# Patient Record
Sex: Female | Born: 1935 | Race: White | Hispanic: No | Smoking: Never smoker
Health system: Southern US, Community
[De-identification: ages and names within clinical notes are randomized; demographics above are authoritative.]

## PROBLEM LIST (undated history)

## (undated) DIAGNOSIS — Z9114 Patient's other noncompliance with medication regimen: Secondary | ICD-10-CM

## (undated) DIAGNOSIS — M1990 Unspecified osteoarthritis, unspecified site: Secondary | ICD-10-CM

## (undated) DIAGNOSIS — G8929 Other chronic pain: Secondary | ICD-10-CM

## (undated) DIAGNOSIS — K449 Diaphragmatic hernia without obstruction or gangrene: Secondary | ICD-10-CM

## (undated) DIAGNOSIS — F32A Depression, unspecified: Secondary | ICD-10-CM

## (undated) DIAGNOSIS — K5289 Other specified noninfective gastroenteritis and colitis: Secondary | ICD-10-CM

## (undated) DIAGNOSIS — Z9289 Personal history of other medical treatment: Secondary | ICD-10-CM

## (undated) DIAGNOSIS — F329 Major depressive disorder, single episode, unspecified: Secondary | ICD-10-CM

## (undated) DIAGNOSIS — E785 Hyperlipidemia, unspecified: Secondary | ICD-10-CM

## (undated) DIAGNOSIS — M549 Dorsalgia, unspecified: Secondary | ICD-10-CM

## (undated) DIAGNOSIS — F419 Anxiety disorder, unspecified: Secondary | ICD-10-CM

## (undated) DIAGNOSIS — Z91148 Patient's other noncompliance with medication regimen for other reason: Secondary | ICD-10-CM

## (undated) DIAGNOSIS — K529 Noninfective gastroenteritis and colitis, unspecified: Secondary | ICD-10-CM

## (undated) DIAGNOSIS — K5730 Diverticulosis of large intestine without perforation or abscess without bleeding: Secondary | ICD-10-CM

## (undated) DIAGNOSIS — I10 Essential (primary) hypertension: Secondary | ICD-10-CM

## (undated) DIAGNOSIS — J189 Pneumonia, unspecified organism: Secondary | ICD-10-CM

## (undated) HISTORY — DX: Patient's other noncompliance with medication regimen for other reason: Z91.148

## (undated) HISTORY — DX: Diverticulosis of large intestine without perforation or abscess without bleeding: K57.30

## (undated) HISTORY — DX: Diaphragmatic hernia without obstruction or gangrene: K44.9

## (undated) HISTORY — DX: Depression, unspecified: F32.A

## (undated) HISTORY — DX: Depression, unspecified: F41.9

## (undated) HISTORY — PX: SPINAL FIXATION SURGERY: SHX1055

## (undated) HISTORY — PX: VAGINAL HYSTERECTOMY: SUR661

## (undated) HISTORY — PX: HERNIA REPAIR: SHX51

## (undated) HISTORY — DX: Major depressive disorder, single episode, unspecified: F32.9

## (undated) HISTORY — DX: Hyperlipidemia, unspecified: E78.5

## (undated) HISTORY — PX: INCONTINENCE SURGERY: SHX676

## (undated) HISTORY — PX: JOINT REPLACEMENT: SHX530

## (undated) HISTORY — PX: BACK SURGERY: SHX140

## (undated) HISTORY — PX: EXCISIONAL HEMORRHOIDECTOMY: SHX1541

## (undated) HISTORY — DX: Essential (primary) hypertension: I10

## (undated) HISTORY — DX: Anxiety disorder, unspecified: F41.9

## (undated) HISTORY — DX: Patient's other noncompliance with medication regimen: Z91.14

## (undated) HISTORY — DX: Other specified noninfective gastroenteritis and colitis: K52.89

---

## 1974-07-25 DIAGNOSIS — Z9289 Personal history of other medical treatment: Secondary | ICD-10-CM

## 1974-07-25 HISTORY — DX: Personal history of other medical treatment: Z92.89

## 1997-04-23 ENCOUNTER — Other Ambulatory Visit: Admission: RE | Admit: 1997-04-23 | Discharge: 1997-04-23 | Payer: Self-pay | Admitting: Nephrology

## 1997-07-08 ENCOUNTER — Inpatient Hospital Stay (HOSPITAL_COMMUNITY): Admission: RE | Admit: 1997-07-08 | Discharge: 1997-07-10 | Payer: Self-pay | Admitting: *Deleted

## 1998-06-23 ENCOUNTER — Other Ambulatory Visit: Admission: RE | Admit: 1998-06-23 | Discharge: 1998-06-23 | Payer: Self-pay | Admitting: *Deleted

## 1998-11-04 ENCOUNTER — Ambulatory Visit (HOSPITAL_BASED_OUTPATIENT_CLINIC_OR_DEPARTMENT_OTHER): Admission: RE | Admit: 1998-11-04 | Discharge: 1998-11-04 | Payer: Self-pay | Admitting: Orthopedic Surgery

## 1999-01-30 ENCOUNTER — Inpatient Hospital Stay (HOSPITAL_COMMUNITY): Admission: EM | Admit: 1999-01-30 | Discharge: 1999-02-02 | Payer: Self-pay | Admitting: *Deleted

## 1999-02-01 ENCOUNTER — Encounter: Payer: Self-pay | Admitting: Internal Medicine

## 1999-06-09 ENCOUNTER — Other Ambulatory Visit: Admission: RE | Admit: 1999-06-09 | Discharge: 1999-06-09 | Payer: Self-pay | Admitting: *Deleted

## 1999-07-19 ENCOUNTER — Encounter: Admission: RE | Admit: 1999-07-19 | Discharge: 1999-07-19 | Payer: Self-pay | Admitting: General Surgery

## 1999-07-19 ENCOUNTER — Encounter: Payer: Self-pay | Admitting: General Surgery

## 1999-11-07 ENCOUNTER — Emergency Department (HOSPITAL_COMMUNITY): Admission: EM | Admit: 1999-11-07 | Discharge: 1999-11-08 | Payer: Self-pay | Admitting: Internal Medicine

## 1999-11-07 ENCOUNTER — Encounter: Payer: Self-pay | Admitting: Internal Medicine

## 1999-12-02 ENCOUNTER — Emergency Department (HOSPITAL_COMMUNITY): Admission: EM | Admit: 1999-12-02 | Discharge: 1999-12-02 | Payer: Self-pay

## 2000-01-03 ENCOUNTER — Encounter (INDEPENDENT_AMBULATORY_CARE_PROVIDER_SITE_OTHER): Payer: Self-pay

## 2000-01-03 ENCOUNTER — Other Ambulatory Visit: Admission: RE | Admit: 2000-01-03 | Discharge: 2000-01-03 | Payer: Self-pay | Admitting: Gastroenterology

## 2000-01-11 ENCOUNTER — Observation Stay (HOSPITAL_COMMUNITY): Admission: RE | Admit: 2000-01-11 | Discharge: 2000-01-12 | Payer: Self-pay | Admitting: Urology

## 2000-05-08 ENCOUNTER — Encounter: Admission: RE | Admit: 2000-05-08 | Discharge: 2000-05-08 | Payer: Self-pay | Admitting: Urology

## 2000-05-08 ENCOUNTER — Encounter: Payer: Self-pay | Admitting: Urology

## 2000-10-13 ENCOUNTER — Encounter (INDEPENDENT_AMBULATORY_CARE_PROVIDER_SITE_OTHER): Payer: Self-pay | Admitting: Specialist

## 2000-10-13 ENCOUNTER — Inpatient Hospital Stay (HOSPITAL_COMMUNITY): Admission: RE | Admit: 2000-10-13 | Discharge: 2000-10-17 | Payer: Self-pay | Admitting: Urology

## 2001-01-24 HISTORY — PX: VENTRAL HERNIA REPAIR: SHX424

## 2001-04-27 ENCOUNTER — Encounter: Payer: Self-pay | Admitting: General Surgery

## 2001-05-04 ENCOUNTER — Inpatient Hospital Stay (HOSPITAL_COMMUNITY): Admission: RE | Admit: 2001-05-04 | Discharge: 2001-05-08 | Payer: Self-pay | Admitting: General Surgery

## 2002-09-17 ENCOUNTER — Other Ambulatory Visit: Admission: RE | Admit: 2002-09-17 | Discharge: 2002-09-17 | Payer: Self-pay | Admitting: *Deleted

## 2003-11-24 ENCOUNTER — Other Ambulatory Visit: Admission: RE | Admit: 2003-11-24 | Discharge: 2003-11-24 | Payer: Self-pay | Admitting: *Deleted

## 2005-01-24 HISTORY — PX: SHOULDER ARTHROSCOPY: SHX128

## 2005-01-24 HISTORY — PX: TOTAL KNEE ARTHROPLASTY: SHX125

## 2005-03-22 ENCOUNTER — Ambulatory Visit (HOSPITAL_COMMUNITY): Admission: RE | Admit: 2005-03-22 | Discharge: 2005-03-23 | Payer: Self-pay | Admitting: Orthopedic Surgery

## 2005-07-12 ENCOUNTER — Inpatient Hospital Stay (HOSPITAL_COMMUNITY): Admission: RE | Admit: 2005-07-12 | Discharge: 2005-07-16 | Payer: Self-pay | Admitting: Orthopedic Surgery

## 2005-07-12 ENCOUNTER — Encounter (INDEPENDENT_AMBULATORY_CARE_PROVIDER_SITE_OTHER): Payer: Self-pay | Admitting: Specialist

## 2005-07-13 ENCOUNTER — Ambulatory Visit: Payer: Self-pay | Admitting: Physical Medicine & Rehabilitation

## 2005-08-11 ENCOUNTER — Observation Stay (HOSPITAL_COMMUNITY): Admission: EM | Admit: 2005-08-11 | Discharge: 2005-08-14 | Payer: Self-pay | Admitting: Emergency Medicine

## 2005-08-12 ENCOUNTER — Encounter: Payer: Self-pay | Admitting: Cardiology

## 2005-10-18 ENCOUNTER — Encounter: Admission: RE | Admit: 2005-10-18 | Discharge: 2005-10-18 | Payer: Self-pay | Admitting: Orthopedic Surgery

## 2006-04-17 ENCOUNTER — Emergency Department (HOSPITAL_COMMUNITY): Admission: EM | Admit: 2006-04-17 | Discharge: 2006-04-17 | Payer: Self-pay | Admitting: Emergency Medicine

## 2007-01-25 HISTORY — PX: LUMBAR SPINE SURGERY: SHX701

## 2007-07-30 ENCOUNTER — Ambulatory Visit (HOSPITAL_COMMUNITY): Admission: RE | Admit: 2007-07-30 | Discharge: 2007-07-31 | Payer: Self-pay | Admitting: Orthopaedic Surgery

## 2007-12-25 ENCOUNTER — Inpatient Hospital Stay (HOSPITAL_COMMUNITY): Admission: EM | Admit: 2007-12-25 | Discharge: 2008-01-01 | Payer: Self-pay | Admitting: Emergency Medicine

## 2008-03-29 IMAGING — US US EXTREM LOW VENOUS*L*
1 series · 14 of 24 positions shown · non-contrast
Comparison: none

CLINICAL DATA: Left leg pain and swelling

 Left  lower extremity venous Doppler ultrasound:
TECHNIQUE: Gray-scale sonography with compression as well as color and duplex
Doppler ultrasound were performed to evaluate the deep venous system from the
level of the common femoral vein through the popliteal and proximal calf veins.

[Series 1: unknown · 14 of 34 slices shown]
[im 1/34]
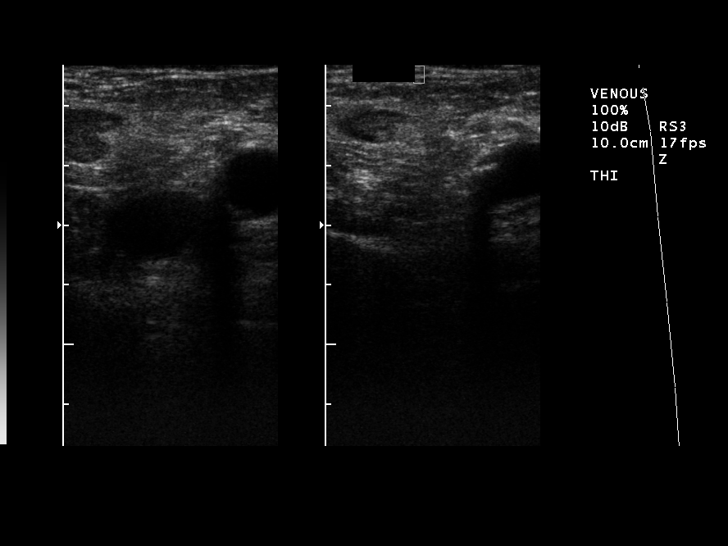
[im 3/34]
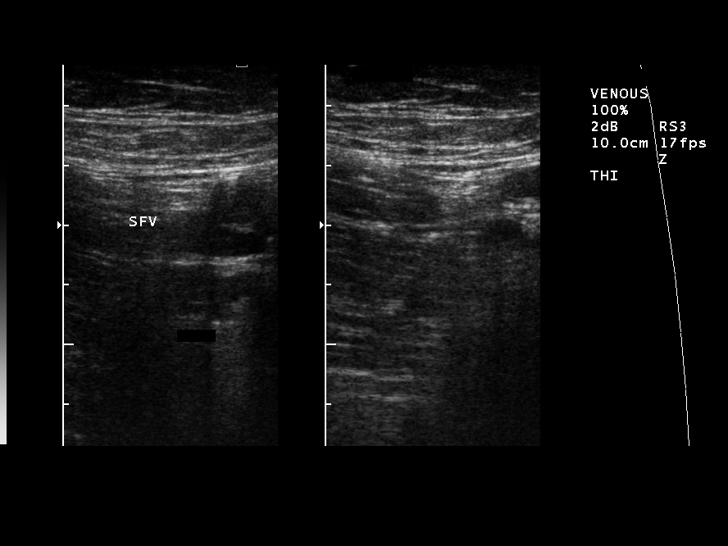
[im 6/34]
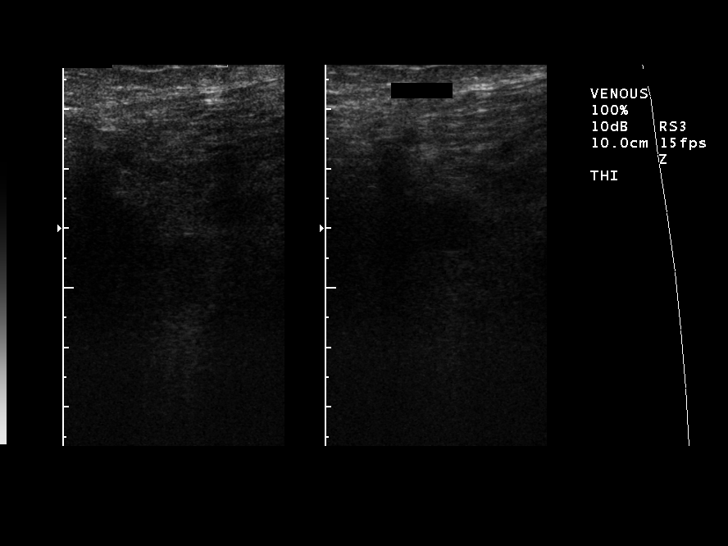
[im 9/34]
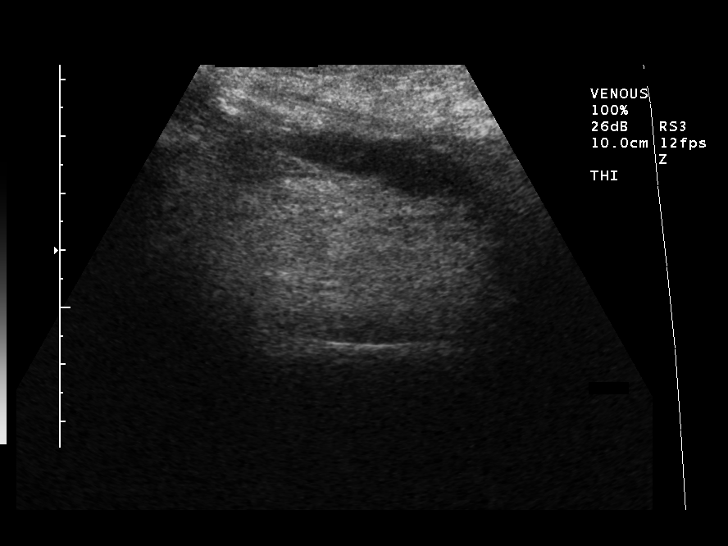
[im 11/34]
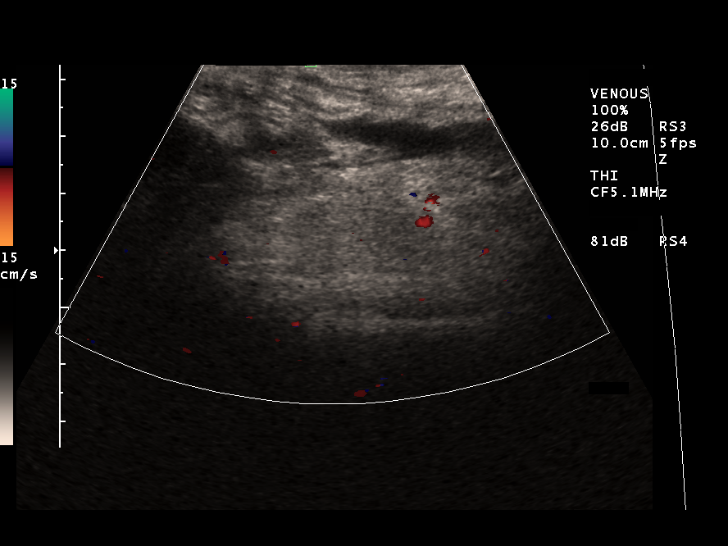
[im 13/34]
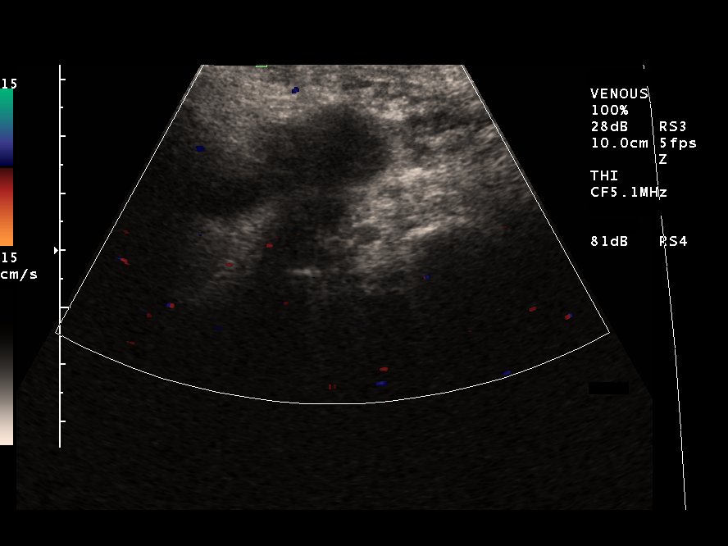
[im 16/34]
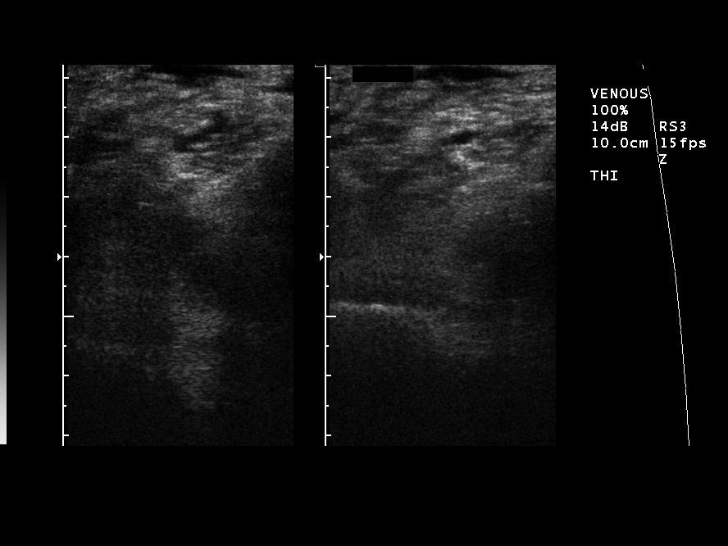
[im 18/34]
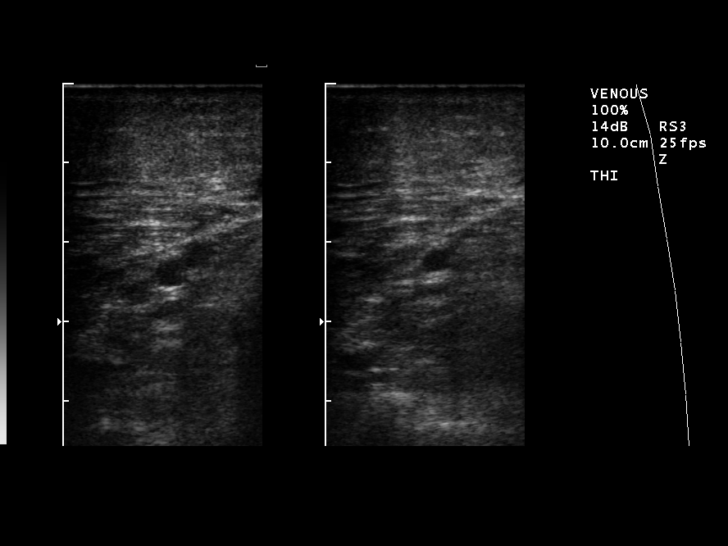
[im 21/34]
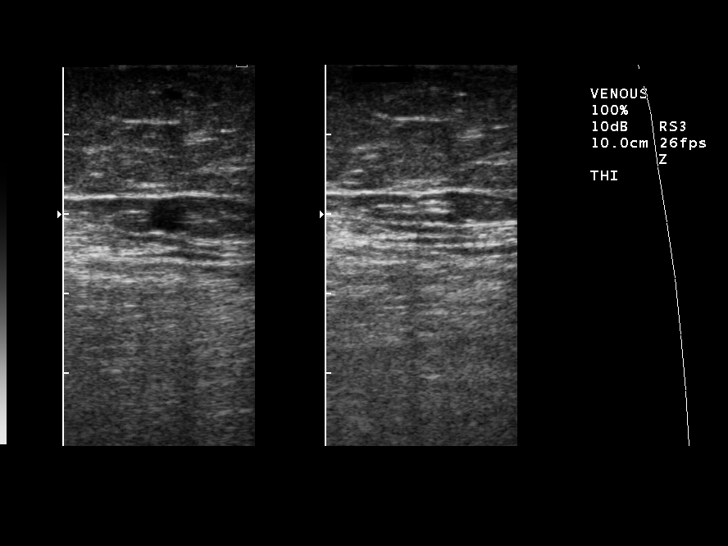
[im 23/34]
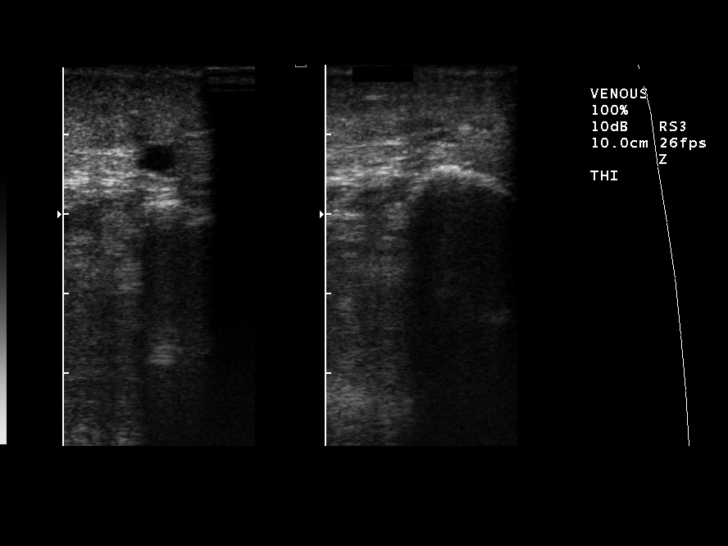
[im 26/34]
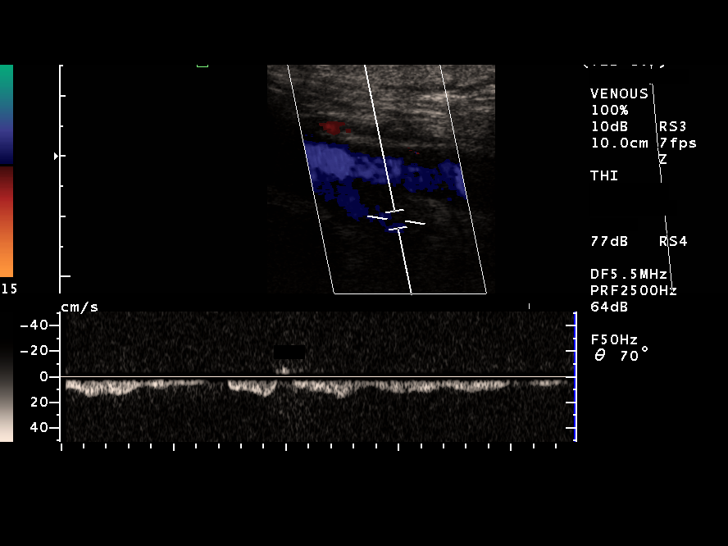
[im 28/34]
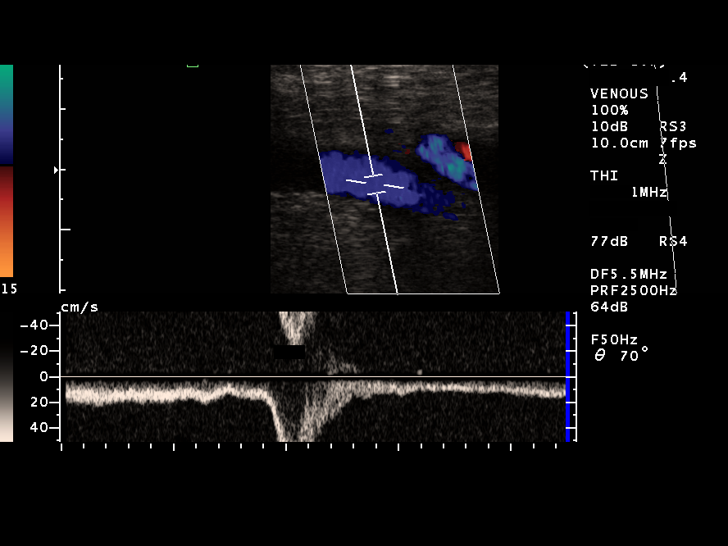
[im 31/34]
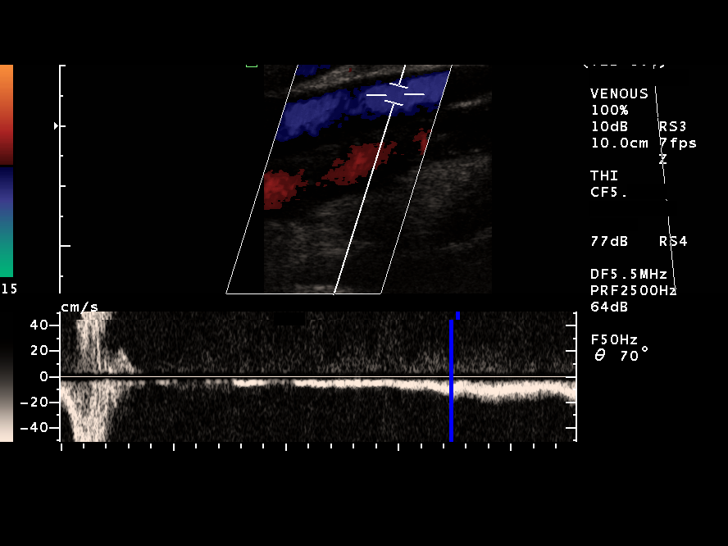
[im 34/34]
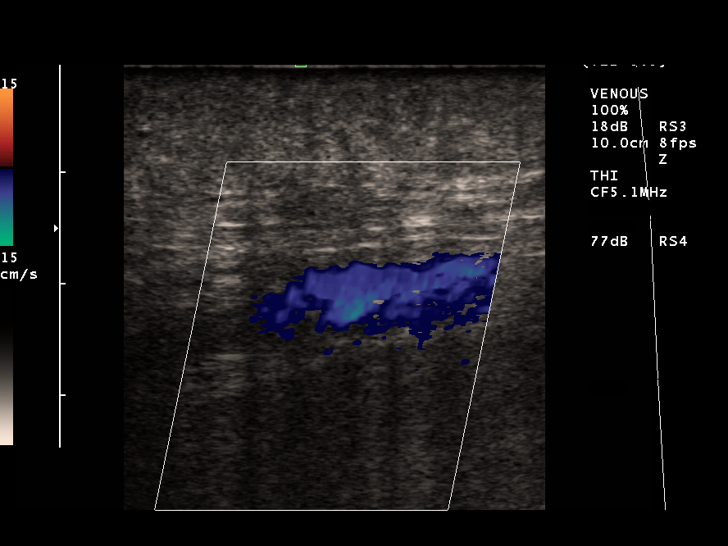

[14 of 24 positions shown; findings below may reference images not displayed]

FINDINGS: The lower extremity deep venous system demonstrates normal
compressibility, phasicity, and augmentation. Posterior tibial vein
unremarkable.  No evidence of DVT. There is a poorly marginated complex fluid
region seen   posteromedially at the left knee extending to the upper calf,
possibly ruptured Baker's cyst.
IMPRESSION: 1. Negative for left  lower extremity DVT.
2. Query ruptured Baker's cyst.

## 2009-03-20 ENCOUNTER — Encounter: Admission: RE | Admit: 2009-03-20 | Discharge: 2009-03-20 | Payer: Self-pay | Admitting: Specialist

## 2009-12-07 ENCOUNTER — Emergency Department (HOSPITAL_COMMUNITY): Admission: EM | Admit: 2009-12-07 | Discharge: 2009-12-07 | Payer: Self-pay | Admitting: Emergency Medicine

## 2009-12-10 ENCOUNTER — Inpatient Hospital Stay (HOSPITAL_COMMUNITY): Admission: RE | Admit: 2009-12-10 | Discharge: 2009-12-14 | Payer: Self-pay | Admitting: Orthopaedic Surgery

## 2009-12-11 ENCOUNTER — Ambulatory Visit: Payer: Self-pay | Admitting: Physical Medicine & Rehabilitation

## 2010-02-14 ENCOUNTER — Encounter: Payer: Self-pay | Admitting: Specialist

## 2010-04-06 LAB — CBC
HCT: 35.6 % — ABNORMAL LOW (ref 36.0–46.0)
MCH: 29.3 pg (ref 26.0–34.0)
MCHC: 32.3 g/dL (ref 30.0–36.0)
RDW: 12.7 % (ref 11.5–15.5)

## 2010-04-06 LAB — GLUCOSE, CAPILLARY
Glucose-Capillary: 111 mg/dL — ABNORMAL HIGH (ref 70–99)
Glucose-Capillary: 116 mg/dL — ABNORMAL HIGH (ref 70–99)
Glucose-Capillary: 141 mg/dL — ABNORMAL HIGH (ref 70–99)
Glucose-Capillary: 199 mg/dL — ABNORMAL HIGH (ref 70–99)
Glucose-Capillary: 68 mg/dL — ABNORMAL LOW (ref 70–99)
Glucose-Capillary: 82 mg/dL (ref 70–99)
Glucose-Capillary: 89 mg/dL (ref 70–99)
Glucose-Capillary: 89 mg/dL (ref 70–99)
Glucose-Capillary: 91 mg/dL (ref 70–99)
Glucose-Capillary: 91 mg/dL (ref 70–99)

## 2010-04-06 LAB — COMPREHENSIVE METABOLIC PANEL
ALT: 11 U/L (ref 0–35)
Calcium: 8.5 mg/dL (ref 8.4–10.5)
Creatinine, Ser: 0.75 mg/dL (ref 0.4–1.2)
Glucose, Bld: 136 mg/dL — ABNORMAL HIGH (ref 70–99)
Sodium: 138 mEq/L (ref 135–145)
Total Protein: 5.8 g/dL — ABNORMAL LOW (ref 6.0–8.3)

## 2010-04-06 LAB — PROTIME-INR
INR: 0.99 (ref 0.00–1.49)
Prothrombin Time: 13.3 seconds (ref 11.6–15.2)

## 2010-04-06 LAB — SURGICAL PCR SCREEN
MRSA, PCR: NEGATIVE
Staphylococcus aureus: NEGATIVE

## 2010-04-07 LAB — DIFFERENTIAL
Eosinophils Absolute: 0.1 10*3/uL (ref 0.0–0.7)
Eosinophils Relative: 2 % (ref 0–5)
Lymphocytes Relative: 28 % (ref 12–46)
Lymphs Abs: 2 10*3/uL (ref 0.7–4.0)
Monocytes Absolute: 0.5 10*3/uL (ref 0.1–1.0)
Monocytes Relative: 7 % (ref 3–12)

## 2010-04-07 LAB — BASIC METABOLIC PANEL
BUN: 23 mg/dL (ref 6–23)
Creatinine, Ser: 1 mg/dL (ref 0.4–1.2)
Glucose, Bld: 81 mg/dL (ref 70–99)

## 2010-04-07 LAB — CBC
HCT: 35.8 % — ABNORMAL LOW (ref 36.0–46.0)
Hemoglobin: 12.2 g/dL (ref 12.0–15.0)
MCH: 31.1 pg (ref 26.0–34.0)
MCV: 91.6 fL (ref 78.0–100.0)
RBC: 3.91 MIL/uL (ref 3.87–5.11)

## 2010-06-08 NOTE — Discharge Summary (Signed)
[name], [name]                 ACCOUNT NO.:  [id number]   MEDICAL RECORD NO.:  [id number]          PATIENT TYPE:  INP   LOCATION:  1517                         FACILITY:  [facility]   PHYSICIAN:  Larina Earthly, M.D.        DATE OF BIRTH:  1932/05/12   DATE OF ADMISSION:  12/25/2007  DATE OF DISCHARGE:  01/01/2008                               DISCHARGE SUMMARY   DISCHARGE DIAGNOSES:  1. Mild right upper lobe pneumonia associated with significant      bronchospasm and probable influenza-like illness.  2. Significant bronchospasm.  3. Influenza-like illness.  4. Hypertension with hypotension, resolved after discontinuation of      Lotrel.  5. Type 2 diabetes mellitus, uncontrolled.  6. Anemia.  7. Anxiety disorder.  8. Pain management status post multiple orthopedic procedures.  9. Questionable urinary tract infection.  10.Nausea, vomiting and diarrhea, now resolved.   DISCHARGE MEDICATIONS:  1. Celexa 20 mg each day.  2. Diazepam 5 mg b.i.d. as needed, #60, no refills given.  3. Meloxicam 15 mg each day.  4. Vicodin 1-2 tablets every 6 hours as needed for pain, #100, no      refills given.  5. Zocor 40 mg each day.  6. Amaryl 2 mg each morning for diabetes which is new, #30 with 4      refills given.  7. The patient was advised to stop Lotrel.  Home health was to be set      up for albuterol nebulizers, monitor respiratory status and oxygen      as needed.  Albuterol to be given 4 times a day.  8. Mucinex 2 tablets twice daily for 1 week.  9. Tamiflu has now been discontinued.  10.Hycodan syrup 5 mL every 6 hours as needed for cough, number 6      ounces, no refills.  11.Levaquin 500 mg each day until January 10, 2008, #10, no refills      given.  12.Prednisone 10 mg.  Take 3 tablets in the morning for 2 days, then 2      tablets in the morning for 2 days, then 1 tablet the morning for 2      days, then stop.  The patient is advised to check her blood sugar      before each  meal and to call Dr. Felipa Eth if it is greater than 200 in      the next few days given that she is on prednisone taper.   She is to follow up with Dr. Felipa Eth in his office in 10-14 days after  discharge.   DISCHARGE LABS:  White blood cell count 7.1, hemoglobin 10.2, hematocrit  28.3%, platelet count 417,000.  Sodium 140, potassium 3.8, BUN 17,  creatinine 0.72.  Sugar after prednisone use was 176.  AST 19, ALT 17,  alkaline phosphatase 61, total bilirubin 0.7, albumin 2.7.  Chest x-ray  on December 30, 2007, revealed continued right upper lobe pneumonia or  findings that were consistent with the same.  Other pertinent labs  include calcium 9.0.   HISTORY OF  PRESENT ILLNESS:  This is a 77 year old Caucasian female who  presented with a several-day history of fever, cough, nausea, diarrhea,  myalgias and arthralgias for 2-3 days that was progressive.  She was  admitted on the day of admission with a right upper lobe pneumonia with  an influenza-like illness by review of systems in the setting of  bronchospasm and close family contact of other members who had  respiratory illnesses.  She was started on IV antibiotics consisting of  Rocephin and azithromycin as well as Tamiflu, which was initiated in the  emergency room.  Her oxygen saturation was 93% on room air.  She is  hemodynamically stable.  She did have significant musculoskeletal pain  in the setting of multiple orthopedic surgeries, and this was  complicated by depression and anxiety disorder for which her Celexa and  Xanax were continued.  She did have significant nausea, vomiting and  diarrhea with a benign abdominal exam and normal liver function tests,  and this was followed.  She also had some dysuria with a history of  multiple urological procedures.  A urinalysis was concerning for  infection; however, culture was negative after antibiotics had been  initiated.   HOSPITAL COURSE:  Despite aggressive intervention with IV  antibiotics  and Tamiflu as well as mucolytic agents, nebulizer support and oxygen,  the patient continued to have significant bronchospasm, continued wet  moist cough, fatigue and malaise and dyspnea on exertion for several  days, albeit she did show evidence and signs of objective and subjective  improvement on a daily basis.  Given her slow progress, her antibiotics  were changed from azithromycin and Rocephin to Levaquin with some  improvement as well as the initiation of both inhaled steroids and oral  steroids.  Proton pump inhibitor was included. Her leukocytosis resolved  and by the day of discharge she was ambulating in her room, going to the  restroom without hindrance, and she was eating approximately 75-100% of  her meals, in no distress and she was thought appropriate for discharge  on January 01, 2008, with the above-mentioned measures.   1. For hypertension, she did develop some hypotension.  Her Lotrel was      discontinued, especially in light of the fact that her ACE      inhibitor may exacerbate her cough.  Her blood pressure remained      stable throughout the rest of her hospitalization with good p.o.      intake, and this will be monitored on an outpatient basis.  2. Type 2 diabetes mellitus with a history of intolerance to      metformin.  She was maintained on sliding scale insulin during the      hospitalization which was made worse by the initiation of      prednisone.  She will be discharged on oral sulfonylurea with close      monitoring and adjustment as needed.  3. Anemia, stable.  Will require outpatient workup given the history      of multiple GI evaluations in the past.  4. Pain management given multiple orthopedic procedures.  She was      maintained on cough syrup for symptomatic relief as well as Vicodin      q.6 hours p.r.n.  This may need to be changed over to long-acting      agents to decrease abuse potential.  5. Questionable urinary tract  infection with culture negative.  We      will  need to recheck on an outpatient basis.  6. Nausea, vomiting and diarrhea on admission, now resolved.   The patient was provided on PPI as well as DVT prophylaxis throughout  her hospitalization.  She was thought to be appropriate for discharge  with her family on January 01, 2008, with close followup in our office.  The patient was told to call our office if cough, shortness of breath,  fever or worsening dyspnea on exertion ensues, and she was agreeable to  the same.      Larina Earthly, M.D.  Electronically Signed     RA/MEDQ  D:  01/01/2008  T:  01/01/2008  Job:  [id number]

## 2010-06-08 NOTE — Op Note (Signed)
[name], [name]                 ACCOUNT NO.:  [id number]   MEDICAL RECORD NO.:  [id number]          PATIENT TYPE:  OIB   LOCATION:  5003                         FACILITY:  [facility]   PHYSICIAN:  Mark C. Ophelia Charter, M.D.    DATE OF BIRTH:  [birth year]   DATE OF PROCEDURE:  DATE OF DISCHARGE:                               OPERATIVE REPORT   PREOPERATIVE DIAGNOSIS:  L4-5 spinal stenosis.   POSTOPERATIVE DIAGNOSIS:  L4-5 spinal stenosis.   PROCEDURE:  L4-5 decompression, microscope-assisted bilateral  foraminotomy.   SURGEON:  Mark C. Ophelia Charter, M.D.   ASSISTANT:  Wende Neighbors, PA   ANESTHESIA:  GOT plus Marcaine skin local.   ESTIMATED BLOOD LOSS150:  150 mL.   PROCEDURE:  After induction of general anesthesia, the patient was  placed prone on chest rolls with careful padding and positioning.  Pumpers,  preoperative antibiotics, DuraPrep, the area squared with  towels, Betadine Vi-drape, and laminectomy sheet drapes were applied.  Time-out procedure was completed using the check list.  Needle  localization and cross table lateral x-ray showed that the needle was  just from the level of the L4-5 space.  This included the top end of the  old incision and this was extended slightly more proximal and  subperiosteal dissection was performed in the lamina.  Lamina was  thinned down and second x-ray was taken with Kocher clamp at the top of  the lamina of L5 and at the upper aspect of lamina of L4.  Cross table  lateral x-ray was taken with one Kocher in the tonsil clamp for  localization.  After a spiral bur was used to thin down in top of the  lamina, L5 foraminotomy was performed.  Thick chunks of ligament were  removed.  Operating microscope was draped and brought in.  Lateral  gutters were decompressed removing chunks of ligament and bone spurs  were overhanging up to the level of pedicle.  There was significant  facet spurring off both in right and left side.  This was palpated and  less firm on both sides.  There was no central herniation present.  Once  decompression was completed, palpation above and below with a hockey  stick with no areas of remaining tightness.  Dura was round.  The Drake  clips were flipped over and felt along the edges as well as hockey stick  with palpation.  The small remaining spicules of bone were removed until  the wall was smooth.  Irrigation with saline solution and FloSeal placed  in each gutter for hemostasis.  Fascia closed with 0 Vicryl and 2-0  Vicryl subcutaneous tissue skin staple closure.  Marcaine infiltration  in the scan and postop dressing.  Instrument count and needle count was  correct.      Mark C. Ophelia Charter, M.D.  Electronically Signed    MCY/MEDQ  D:  07/30/2007  T:  07/31/2007  Job:  [id number]

## 2010-06-08 NOTE — H&P (Signed)
[name], [name]                 ACCOUNT NO.:  [id number]   MEDICAL RECORD NO.:  [id number]          PATIENT TYPE:  INP   LOCATION:  1517                         FACILITY:  [facility]   PHYSICIAN:  Larina Earthly, M.D.        DATE OF BIRTH:  [birth year]   DATE OF ADMISSION:  12/25/2007  DATE OF DISCHARGE:                              HISTORY & PHYSICAL   CHIEF COMPLAINT:  Fever, cough, nausea, diarrhea, myalgias and  arthralgias x2-3 days, progressive.   HISTORY OF PRESENT ILLNESS:  This is a 77 year old Caucasian female with  a 2-3 day history of fevers, cough productive of clear sputum, rhinitis,  myalgias, arthralgias and in setting of chronic low back pain with mild  increasing work of breathing and wheezing x1-2 days.  This has been  associated with nausea, vomiting and diarrhea on an intermittent basis  times approximately 7 days.  Of note, the patient's son, who lives with  the patient, has also been diagnosed with pneumonia with the last week.  Given the progressive of this patient's symptoms, the patient presented  to the emergency room for further evaluation and management where she  was found to have a normal oxygen saturation of 93%. Moderate fever of  100 degrees Fahrenheit and a chest x-ray consistent with right upper  lobe pneumonia and questionable right hilar fullness.  The patient does  not have a smoking history but does have multiple risk factors  consisting of the metabolic syndrome as listed below.  She is now  admitted for management of her pneumonia and with the possibility of an  influenza-like illness and in setting of bronchospasm.  She will be  given IV antibiotics, Tamiflu and nebulizer treatments for symptomatic  help.   REVIEW OF SYSTEMS:  Again positive for fevers, chills, nausea, vomiting  and diarrhea with no blood in the emesis or stool.  Denies any chest  pain but does admit to some pleuritic discomfort with coughing.  She  denies any new musculoskeletal  or neurological symptoms, despite her  multiple orthopedic ailments.   PROBLEM LIST:  1. Hypertension.  2. Type 2 diabetes mellitus, refractory to outpatient Metformin      management.  3. Multiple orthopedic surgeries, well documented in the hospital      chart including recent lumbar surgery done in July of 2009.  4. Depression and anxiety disorder.  5. Hyperlipidemia.  6. Multiple urological procedures.  7. History of noncompliance with medications.  8. History of cardiovascular workup for atypical chest pain.  Negative      in December of 2007.   ALLERGIES:  No known drug allergies.   MEDICATIONS:  1. Lotrel 5/20 one p.o. daily.  2. Celexa 20 mg p.o. daily.  3. Zocor 40 mg p.o. daily.  4. History of Metformin 500 mg p.o. 2 times a day but the patient is      not taking this secondary to side effects.  5. Vicodin p.r.n.  6. Mobic 15 mg p.o. daily.  7. The patient has had a history of Valium prescriptions in the past.  SOCIAL HISTORY:  The patient is widowed for approximately six years.  Has no history of tobacco or alcohol abuse and is retired from  __________   FAMILY HISTORY:  Significant for early coronary artery disease, type 2  diabetes mellitus, hypertension and questionable chronic obstructive  pulmonary disease.   LABORATORY DATA:  In the emergency room revealed sodium 138, potassium  4.9, glucose 164, BUN 17, creatinine 1.0, chloride 104, white blood cell  count 10,900, hemoglobin 11.8, hematocrit 35.6%, platelet count were  clumped with 74% neutrophils.  Chest x-ray revealed probable right upper  lobe pneumonia and possible right super hilar soft tissue fullness and  cardiomegaly without congestive heart failure.   PHYSICAL EXAMINATION:  GENERAL:  We have a Caucasian female, ambulating  around her room with some mild cough but alert and oriented x3 without  oxygen in place.  VITAL SIGNS:  Temperature is 98.9 degrees Fahrenheit, pulse 88 and  regular,  respirations 20 and mildly labored, blood pressure 142/82,  oxygen saturation 93% on room air.  HEENT:  The sclerae were anicteric.  Extraocular movements are intact.  The face is symmetric.  Tongue is midline.  There are no oropharyngeal  lesions.  NECK:  Supple.  There is no cervical lymphadenopathy.  LUNGS:  Revealed coarse breath sounds with expiratory wheezing with no  significant respiratory distress.  CARDIOVASCULAR:  Reveals a regular rate and rhythm without murmurs, rubs  or gallops appreciated.  ABDOMEN:  Reveals a soft and nontender and nondistended abdomen.  The  bowel sounds are present.  EXTREMITIES:  Revealed no edema.  Pedal pulses are intact as well as  radial pulses in the upper extremities.  There is some mild  osteoarthritic changes present but no active synovitis.  The patient has  full range of motion.  Scar on the lumbar section of the back that is  well healed without erythema or discharge.  There is no Homan signs or  cords present in the lower extremities.  Calves are soft bilaterally.  NEUROLOGIC EXAMINATION:  Grossly nonfocal with no decreased sensation to  light touch.   ASSESSMENT AND PLAN:  1. Right upper lobe pneumonia with an influenza-like illness by review      of systems in the setting of bronchospasm and history of close      family contacts with respiratory illnesses.  Will provide IV      antibiotics consisting of Rocephin and azithromycin which have      already been initiated in the emergency room along with Tamiflu      given the possibility of an ILI.  Will provide nebulizer support,      oxygen as needed.  Mucolytic agents and close radiological follow      up of the right super hilar fullness observed on chest x-ray.  2. Musculoskeletal pain.  Again a history of multiple orthopedic      surgeries.  Will provide Vicodin which she has been using on a      regular basis at home and caution the patient about the initiation      or side effects  consisting of constipation.  3. Depression and anxiety disorder.  Will continue Celexa and add      Xanax as needed during her hospitalization.  4. Hypertension.  Appears controlled.  Continue Lotrel.  5. Recent nausea and vomiting and diarrhea.  The abdominal exam is      benign.  The patient is currently asymptomatic and will provide IV  fluids and check liver function tests.  6. The patient does complain of some dysuria with a history of      multiple urological procedures.  Will check a urinalysis, albeit      antibiotics have already been obtained and this may not give [place]      valid results if infection is present.      Larina Earthly, M.D.  Electronically Signed     RA/MEDQ  D:  12/25/2007  T:  12/25/2007  Job:  [id number]

## 2010-06-11 NOTE — Discharge Summary (Signed)
[facility]  Patient:    [name], [name] Visit Number: [id number] MRN: [id number]          Service Type: SUR Location: 3W [id number] 01 Attending Physician:  Londell Moh Dictated by:   Jamison Neighbor, M.D. Admit Date:  10/13/2000 Discharge Date: 10/17/2000   CC:         Ravi R. Felipa Eth, M.D.  Teena Irani. Odis Luster, M.D.   Discharge Summary  ADMITTING DIAGNOSES: 1. Stress urinary incontinence. 2. Vaginal vault prolapse. 3. Enterocele.  SECONDARY DIAGNOSES: 1. Hypertension. 2. Esophageal reflux. 3. Cystic ovary.  PRINCIPAL PROCEDURE:  ______, Moschcowitz enterocele repair, birch bladder neck suspension.  SECONDARY PROCEDURE:  Abdominoplasty by Dr. Etter Sjogren.  HISTORY:  This 76 year old female has stress incontinence that did not respond to a pubovaginal sling.  The patient is given the option of revision of the sling but in the interim had developed further problems with prolapse to the top of her vaginal vault.  She also requested that she have an abdominoplasty performed.  For that reason the patient wanted to undergo an abdominal approach to include ______ and birch procedure along with the abdominoplasty.  PAST MEDICAL HISTORY:  Remarkable for hypertension and borderline diabetes.  MEDICATIONS:  Allegra, Valium, Prozac, Norvasc, potassium.  PAST SURGICAL HISTORY:  Pubovaginal sling, hysterectomy, hemorrhoid surgery, anterior repair, back surgery, cervical disk surgery x 2.  HOSPITAL COURSE:  Patient taken to the operating room where she underwent ______, Moschcowitz enterocele repair, birch bladder neck suspension, and eventual abdominoplasty by Dr. Etter Sjogren.  Her postoperative course was unremarkable.  She was rapidly advanced to a regular diet which she tolerated without difficulty.  Patients flaps appear to be healing very nicely.  She voided without any difficulty when the Foley catheter was removed.  She was ready for  discharge on October 17, 2000.  She was sent home with a prescription for Cipro 500 mg b.i.d. and Lorcet 10 to take p.r.n. for pain. She was asked to drain her Jackson-Pratt drains three times a day and to keep those in place until removed by Dr. Odis Luster.  She will return to his office and my office in followup within one to two weeks. Dictated by:   Jamison Neighbor, M.D. Attending Physician:  Londell Moh DD:  10/24/00 TD:  10/24/00 Job: [id number]

## 2010-06-11 NOTE — Discharge Summary (Signed)
[name], [name]                 ACCOUNT NO.:  [id number]   MEDICAL RECORD NO.:  [id number]          PATIENT TYPE:  INP   LOCATION:  5003                         FACILITY:  [facility]   PHYSICIAN:  Burnard Bunting, M.D.    DATE OF BIRTH:  Jun 19, 1932   DATE OF ADMISSION:  07/12/2005  DATE OF DISCHARGE:  07/16/2005                                 DISCHARGE SUMMARY   DISCHARGE DIAGNOSIS:  Left knee arthritis.   SECONDARY DIAGNOSIS:  1. Lumbar spine surgery.  2. C-spine surgery.  3. Hypercholesterolemia.  4. Hypertension.   OPERATIONS/PROCEDURES:  Left total knee replacement July 12, 2005.   HOSPITAL COURSE:  [name] is a 77 year old female with end-stage left  knee arthritis.  She underwent left total knee replacement on July 12, 2005.  She tolerated the procedure well without any complications.  Postop day #1,  the patient's foot was perfused and sensate, hemoglobin was 10.1 at that  time.  She was started on Coumadin for DVT prophylaxis.  PT for mobilization  and CPM for range of motions.  Incision was intact on postop day #3.  She  had an otherwise unremarkable  recovery.  She is ambulating well in the  hallway.  She had excellent range of motion by the time of discharge on July 16, 2005.  She is discharged home in good condition.   DISCHARGE MEDICATIONS:  Include Vytorin, Norvasc, which is a blood pressure  medications, as well as Coumadin for DVT prophylaxis, Percocet for pain and  Robaxin as a muscle relaxer.           ______________________________  G. Dorene Grebe, M.D.     GSD/MEDQ  D:  09/14/2005  T:  09/15/2005  Job:  [id number]

## 2010-06-11 NOTE — Discharge Summary (Signed)
NAMEMARLICIA, [name]                 ACCOUNT NO.:  [id number]   MEDICAL RECORD NO.:  [id number]          PATIENT TYPE:  OBV   LOCATION:  4736                         FACILITY:  [facility]   PHYSICIAN:  Larina Earthly, M.D.        DATE OF BIRTH:  10-Dec-1932   DATE OF ADMISSION:  08/11/2005  DATE OF DISCHARGE:  08/14/2005                               DISCHARGE SUMMARY   DISCHARGE DIAGNOSES:  1. Left shoulder pain associated with left chest pain in the setting      of multiple cardiovascular comorbidities.  2. Recent total knee replacement by Dr. Dorene Grebe.  3. Type 2 diabetes mellitus.  4. History of multiple spinal surgeries.  5. History of depression and anxiety.  6. Family history of early coronary artery disease.  7. Hypertension.  8. Hyperlipidemia.  9. History of multiple urological procedures.  10.Left shoulder arthroscopic surgery in early 2007 by Dr. Dorene Grebe.  11.Left total knee replacement in June 2007 by Dr. Dorene Grebe.  12.History of noncompliance with all medications.   CONSULTATIONS:  From Dr. Otelia Sergeant of orthopedics and Dr. Patty Sermons of  cardiology.   PROCEDURES:  A 2-D echocardiogram August 12, 2005 revealing overall left  ventricular systolic function being normal with no ventricular regional  wall motion abnormalities. There were some asymmetric septal hypertrophy  and some mildly increased aortic valve thickness. EKG reveals sinus  tachycardia; otherwise, normal ECG with no significant changes compared  to old. Left shoulder x-ray reveals no acute findings with mild  degenerative changes affecting the acroclavicular as well as  glenohumeral joint. Chest x-ray reveals increased inspiratory effort  with minimal basilar left greater than right airspace disease, likely  atelectasis. Chest CT reveals no evidence of pulmonary embolism. Stress  Cardiolite on August 13, 2005 revealed an ejection fraction of 72%, no  evidence of ischemia, normal left ventricular ejection fraction.  and  tiny fixed inferolateral defect of unclear significance   LABORATORY EVALUATION:  Admission white blood cell count 5.4, hemoglobin  12.1, hematocrit 37.2%, platelet count 328.  PT/INR 4.1. Sodium 138,  potassium 3.1, BUN 8, creatinine 0.6. Upon discharge the potassium was  3.9 with a BUN of 19 and creatinine of 0.8. Eight sets of cardiac  enzymes including troponin I were unremarkable. Total cholesterol is  122, HDL 42, triglycerides 87, and LDL was 63.   HISTORY OF PRESENT ILLNESS:  Please see my History and Physical dated  from August 11, 2005 for extensive details. This patient presented with  chest pain associated with left shoulder pain that resolved with  intravenous nitrates, morphine, and Ativan given in the emergency room.  Her exam was most notable for decreased range of motion of the left  shoulder compared to her normal baseline, but this is in the setting of  multiple cardiovascular risk factors. She was admitted to telemetry to  rule out myocardial infarction. She was continued on morphine and  nitrates as well as a consultation from cardiology and orthopedics was  requested. With respect to her diabetes she was on diet and exercise.  However, a spot  check in the emergency room revealed a value of 274, and  she was started on sliding scale insulin regimen. Please note that  admission medications included Norvasc, Coumadin, pain medications  ordered by Dr. August Saucer, Valium, Lexapro, Vytorin 10/40, Prilosec 1-2 each  day along with Advil p.r.n.   HOSPITAL COURSE:  Despite the above-mentioned interventions the patient  continued to have chest pain and left upper extremity pain getting  morphine to control the same with nitrates. She had poor sleep. Her  cardiac enzymes were unremarkable. She remained hemodynamically stable.  Her echocardiogram was unremarkable with Myoview stress test pending.  She was seen by orthopedics who entertained the idea of introducing  steroids  into her left shoulder joint. However, given her elevated  PT/INR they deferred this and recommending discontinuing Coumadin given  the fact that she was full weightbearing status post knee replacement  and recommended initiating aspirin when her INR normalized. They further  ordered physical therapy and recommended starting Celebrex with a Medrol  Dosepak. EKG continued to show no ischemic changes, and her stress test  was unremarkable as noted above. Towards the latter part of her  hospitalization she remained pain free and given the fact that  reversible ischemia was ruled out as well as the fact that her  echocardiogram and stress tests were unremarkable, she was deemed  appropriate for discharge home with further outpatient management per  orthopedics. Dr. Otelia Sergeant recommended home health PT and OT as well as  follow-up with Dr. August Saucer on Wednesday, July 25. He further gave the  patient a prescription for hydrocodone p.r.n. The patient was discharged  in stable and improved condition.      Larina Earthly, M.D.  Electronically Signed     RA/MEDQ  D:  01/12/2006  T:  01/12/2006  Job:  [id number]   cc:   G. Dorene Grebe, M.D.  Cassell Clement, M.D.

## 2010-06-11 NOTE — Op Note (Signed)
NAME:  Alexa, Pacheco                 ACCOUNT NO.:  [id number]   MEDICAL RECORD NO.:  [id number]          PATIENT TYPE:  INP   LOCATION:  5003                         FACILITY:  [facility]   PHYSICIAN:  Burnard Bunting, M.D.    DATE OF BIRTH:  1932/09/18   DATE OF PROCEDURE:  07/12/2005  DATE OF DISCHARGE:                                 OPERATIVE REPORT   PREOPERATIVE DIAGNOSIS:  Left knee arthritis.   POSTOPERATIVE DIAGNOSIS:  Left knee arthritis.   PROCEDURE:  Left total knee replacement.   SURGEON:  Burnard Bunting, M.D.   ASSISTANT:  Jerolyn Shin. Tresa Res, M.D.   ANESTHESIA:  General endotracheal.   ESTIMATED BLOOD LOSS:  200.   DRAINS:  Hemovac x1.   TOURNIQUET TIME:  1 hour 59 minutes at 300 mmHg.   COMPLICATIONS:  DePuy rotating platform posterior stabilized cemented 3  femur, 4 tibia, 10 poly, 35 patella.   INDICATIONS:  [name] is a 77 year old patient with end-stage left knee  arthritis.  Risks and benefits of knee replacement are explained, and she  agreed to proceed after failure of conservative management.   PROCEDURE IN DETAIL:  The patient was brought to the operating room, where  general endotracheal anesthesia was induced.  Appropriate IV lines and  monitors placed.  Left leg, foot, and knee were prepped with DuraPrep  solution and draped in the usual sterile manner.  The operative field was  covered with Ioban.  Leg was elevated, exsanguinated with the Esmarch wrap,  and the tourniquet was inflated over a bolster.  An anterior approach to the  knee was utilized.  Skin and subcutaneous tissues were sharply divided.  A  median parapatellar approach was made.  __________  arthrotomy was marked  with a #1 Vicryl suture.  Fat pad was partially excised.  Lateral  patellofemoral ligament was released.  Soft tissue was cleared off the  anterior distal femur.  Minimal soft tissue elevation was performed medially  because of the patient's overall intact alignment.  At this  time,  significant wear was seen in the medial and lateral compartments.  ACL and  PCL released.  Two pins were placed in the distal anteromedial femur, then  proximal anteromedial tibia.  Computer registration points were achieved,  first beginning with the hip in rotation, the lower axis, and multiple  points around the knee.  In accordance with preoperative templating and  computer-generated planning, the tibia was cut perpendicular to the  mechanical axis in 0 degrees of slope, tensioning device that demonstrated  symmetric flexion and extension gap.  Distal femur was then cut, followed by  the chamfer cuts and box cut.  Distal femur was placed. Tibial keel punch  was then performed, and trial reduction was performed.  The patient achieved  2 degrees of hyperextension and full flexion with no liftoff, instability in  collateral ligaments in 0, 30, and 90 degrees with a 10 poly spacer.  At  this time, patella was cut, freehand technique.  The 3-peg, 35 mm trial  patella was placed, with __________ patellar tracking and  full range of  motion of the knee noted.  At this time, trial components were removed.  The  pulsatile lavage was used to irrigate the incision.  True components were  then cemented into position.  Excess cement was removed.  Cement was allowed  to harden.  Same stability and range of motion parameters were maintained.  Tourniquet was released.  Bleeding points encountered were controlled with  electrocautery.  The Hemovac drain was placed over a bolster.  The medial  parapatellar arthrotomy was closed using #1 Vicryl suture in an interrupted  fashion, followed by interrupted 3-0 Vicryl suture, 2-0 Vicryl suture, with  skin staples.  Pins were removed.  Those incisions were irrigated and closed  using 3-0 nylon suture.  __________  were injected into the knee.  A bulky  knee immobilizer was placed.  The patient tolerated the procedure well  without any  complications.           ______________________________  G. Dorene Grebe, M.D.     GSD/MEDQ  D:  07/12/2005  T:  07/13/2005  Job:  [id number]

## 2010-06-11 NOTE — Op Note (Signed)
[facility]  Patient:    [name], [name] Visit Number: [id number] MRN: [id number]          Service Type: SUR Location: 1S 0004 01 Attending Physician:  Londell Moh Dictated by:   Jamison Neighbor, M.D. Proc. Date: 10/13/00 Admit Date:  10/13/2000   CC:         Teena Irani. Odis Luster, M.D.  Ravi R. Felipa Eth, M.D.   Operative Report  PREOPERATIVE DIAGNOSES: 1. Recurrent stress urinary incontinence. 2. Vaginal vault prolapse.  POSTOPERATIVE DIAGNOSES: 1. Recurrent stress urinary incontinence. 2. Vaginal vault prolapse.  PROCEDURE:  Vaginal sacropexy, Moschcowitz repair of enterocele, and Burch bladder neck suspension.  SURGEON:  Jamison Neighbor, M.D.  ANESTHESIA:  General.  COMPLICATIONS:  None.  DRAINS:  A 16-French Foley catheter.  BRIEF HISTORY:  This is a 77 year old female who underwent pubovaginal sling placement for correction of stress urinary incontinence.  The patient has had a recurrence of her incontinence, and appears to have some residual hypermobility.  While the urethra is generally well-supported, she does have posterior prolapse, and it is felt that the patient would benefit from having a complete vaginal vault suspension.  The patient is interested in having an abdominoplasty, and requested that these be done simultaneously.  The patient understands the risks and benefits of the procedure including the possibility of postoperative retention, postoperative ileus, infection of the wound, as well as the possibility that she may not have complete correction of her incontinence, and may require collagen injections.  She gave full and informed consent.  DESCRIPTION OF PROCEDURE:  After the successful induction of general anesthesia, the patient was placed in a low lithotomy position, prepped with Betadine, and draped in the usual sterile fashion.  The patient had previously had an incision marked out by Dr. Odis Luster.  The bottom  portion of this corresponded to a standard Pfannenstiel incision.  The incision was made and carried down through the subcutaneous tissue until the rectus sheath was identified.  The rectus sheath was opened in a curvilinear fashion, exposing the underlying rectus abdominis.  The rectus abdominis was exposed by elevating a flap of the rectus sheath.  The rectus was opened in the midline, allowing entry into the peritoneum.  The patients bowel was packed off superiorly, and the Bookwalter retractor was placed.  The Foley catheter was inserted and the bladder was drained.  The posterior peritoneum was opened directly over the sacral promontory, and the promontory was carefully exposed through the use of blunt dissection.  Hemostasis obtained with electrocautery.  Three sutures of 0 Prolene were then placed in the periosteum in preparation for the vaginal vault suspension.  A Beaver retractor was placed in the vagina and used to elevate the vaginal cuff.  The patient had three sutures of 0 Prolene placed in the vaginal cuff.  A piece of 1 x 4 mesh was then folded in half, and sutured down first to the periosteal stitches, and then to the stitches in the vaginal cuff.  This very nicely supported the top of the vault.  Before the sacropexy was performed, the patient had a circumferential Moschcowitz closure of the large enterocele defect.  A second layer of closure was then performed on top of the mesh to close the defect in the peritoneum and to completely cover over the mesh.  Any traps that would allow small bowel to fall into the pelvis were carefully closed, but care was taken to ensure that there was not excessive  tightness on the sigmoid.  The two ovaries were carefully identified and were felt to be normal with the exception of a small cyst seen on the right ovary.  This was removed and sent for permanent section.  Attention was then directed to the pelvis.  The patient appeared to  be partially suspended, and one of the sutures from the sling was identified, and was cut away.  It did not appear however that the patient was as well-supported as would be optimal.  On each side, sutures of 0 Prolene were placed into the paravaginal tissue.  These were brought up to the Coopers ligament and sutured in place.  It appeared that there was good support for the bottom portion of the vagina after these had been tied down.  The area was carefully inspected.  Adequate hemostasis had been obtained. There was good support to the bladder.  The sponges were all removed.  The perineum and rectus sheath were then closed with Vicryl suture.  The fascia was then closed with a running suture of Vicryl.  Dr. Odis Luster then entered the room, repositioned the patient, and redraped in preparation for the abdominoplasty. Dictated by:   Jamison Neighbor, M.D. Attending Physician:  Londell Moh DD:  10/13/00 TD:  10/13/00 Job: [id number]

## 2010-06-11 NOTE — Consult Note (Signed)
NAMEVERGIA, [name]                 ACCOUNT NO.:  [id number]   MEDICAL RECORD NO.:  [id number]          PATIENT TYPE:  INP   LOCATION:  4736                         FACILITY:  [facility]   PHYSICIAN:  Cassell Clement, M.D. DATE OF BIRTH:  September 23, 1932   DATE OF CONSULTATION:  08/14/2005  DATE OF DISCHARGE:                                   CONSULTATION   REFERRING PHYSICIAN:  Dr. Larina Earthly.   HISTORY:  This 77 year old Caucasian female was admitted by Dr. Felipa Eth on August 11, 2005 because of left shoulder pain, left chest pain and shortness of  breath.  She presented to the emergency room with these symptoms.  She has a  history of having had a previous surgery on her left shoulder approximately  5 or 6 months ago by Dr. Dorene Grebe and more recently had a left total knee  replacement 4 weeks ago by Dr. Dorene Grebe.  She is still on Coumadin.  Home  health nurses have been coming out to the house until a week or 2 ago, when  they stopped having.  She states that she had not had a prothrombin time  checked in the past 2 weeks.  She came to the emergency room, August 11, 2005,  at which time her INR was excessive at 3.4.  She denies any history of known  trauma to the left shoulder, but does have an area of bruising and  ecchymosis on the left shoulder on exam today.  The patient has multiple  risk factors for coronary artery disease.  She is a type 2 diabetic.  She  has a history of hypertension and hypercholesterolemia.  She has a family  history of coronary disease.  She was seen in our office on July 28, 2004, at  which time she had a treadmill Cardiolite stress test.  She walked 7 minutes  16 seconds and reached a maximum heart rate of 127 and had no evidence of  any ischemia or infarction on perfusion studies, and her ejection fraction  was normal at 64%.  The patient has been on Vytorin 10/40 one daily and  Lexapro 10 mg daily, Prilosec 20 mg to 40 mg daily and Norvasc 5 mg a day,  as well as  Coumadin one tablet daily.   SOCIAL HISTORY:  She is a widow.  She retired from [employer].  She has 4  children.   REVIEW OF SYSTEMS:  The remainder of the review of systems is negative.   PHYSICAL EXAMINATION:  GENERAL:  This evening, the patient is in no acute  distress.  She is still complaining of soreness and discomfort in the left  shoulder and left upper chest.  General appearance reveals a well-developed,  well-nourished woman in no acute distress.  VITAL SIGNS:  Stable with a blood pressure of 121/71, pulses of 85 and  normal sinus rhythm, temperature 98.7, and respirations are 20.  Oxygen  saturation is 96% on room air.  SKIN:  Clear, except for ecchymosis over her left shoulder.  NECK:  The jugular venous pressure is normal, carotids normal,  thyroid  normal, no lymphadenopathy.  CHEST:  Clear to auscultation.  HEART:  Reveals a soft systolic ejection murmur at the base.  There is no  gallop.  There is no rub.  ABDOMEN:  Soft.  The liver and spleen are not enlarged.  EXTREMITIES:  Show that the left knee incision appears to be healing well.  There is no evidence of any deep vein thrombosis or phlebitis or cellulitis.  Pedal pulses are excellent and bounding.  NEUROLOGIC:  Exam is physiologic.   LABORATORY AND ACCESSORY CLINICAL DATA:  Her chest x-ray shows some  bibasilar atelectasis.   Chest CT was obtained because of an elevated D-dimer and did not show any  evidence of pulmonary embolus or other acute disease.   Her cardiac enzymes show CK-MBs of 2.2, 2.2 and 1.6 with troponins of 0.01,  0.02 and 0.02.  Her electrolytes are normal with an initial serum potassium  of 3.1 and after repletion with Joyce Gross Ciel, potassium is 3.7.   Her electrocardiogram shows sinus tachycardia at 103 and no ischemic  changes.  She had an echocardiogram on August 14, 2005 which shows no  significant aortic stenosis.  There is aortic sclerosis and she also has  heavy calcification of the  mitral annulus, but no significant mitral  valvular regurgitation.  She does have left ventricular hypertrophy with  mild asymmetric septal hypertrophy and evidence of abnormal left ventricular  relaxation.   IMPRESSION:  The patient does have multiple risk factors for coronary  disease including a strong family history, sedentary lifestyle, hypertension  and hypercholesterolemia and diabetes.  Her present chest pain, however, is  most likely noncardiac.  I suspect that it may be related to chest wall  discomfort and it is possible that she may have had a little bleeding into  her left shoulder from the Coumadin, which is now on hold.   My recommendation is that we complete her workup in the morning with an  adenosine Myoview stress test and this has been scheduled.   Many thanks for the opportunity to see this pleasant woman with you.           ______________________________  Cassell Clement, M.D.     TB/MEDQ  D:  08/12/2005  T:  08/13/2005  Job:  [id number]   cc:   Larina Earthly, M.D.  Fax: [phone]

## 2010-06-11 NOTE — Op Note (Signed)
[facility]  Patient:    CHANIKA, BYLAND Visit Number: [id number] MRN: [id number]          Service Type: SUR Location: 1S X004 01 Attending Physician:  Brandy Hale Dictated by:   Angelia Mould. Derrell Lolling, M.D. Proc. Date: 05/04/01 Admit Date:  05/04/2001   CC:         Jamison Neighbor, M.D.  Ravi R. Felipa Eth, M.D.   Operative Report  PREOPERATIVE DIAGNOSIS:  Complex ventral incisional hernia.  POSTOPERATIVE DIAGNOSIS:  Complex ventral incisional hernia.  OPERATION PERFORMED:  Repair of complex ventral incisional hernia with polypropylene mesh.  SURGEON:  Angelia Mould. Derrell Lolling, M.D.  FIRST ASSISTANT:  Jamison Neighbor, M.D.  INDICATIONS FOR PROCEDURE:  This is a 77 year old white female who has had multiple abdominal operations. She has had a cesarean section, hysterectomy. She had an abdominoplasty in 1980. She had a pubovaginal sling in the past which failed. In September of 2002, she underwent a Birch bladder neck suspension and abdominoplasty. Since that time, she has noticed some bulges in the left lower and right lateral aspects of her left transverse incision. I have examined her and I found that she has a fairly large, fairly low but reducible hernias on both sides just above the inguinal area but clearly in her incisions. This is felt to be most likely a complex incisional hernia. She is brought to the operating room electively for repair because of the size of her bulge and discomfort.  DESCRIPTION OF PROCEDURE:  Following the induction of general endotracheal anesthesia, the patient had a Foley catheter inserted and the abdomen and groins and genitalia were prepped and draped in a sterile fashion. The curved transverse incision that was in place from her anterior superior iliac spine on the left to the right was excised. Dissection was carried down through the subcutaneous tissue. We encountered lots of scar tissue. We took  the dissection inferiorly and dissected the scar tissue up until we could actually find a free edge of fascia inferiorly. We then debrided the scar tissue anterior to this and mobilized this fascia all the way from the anterior superior iliac spine on the right to the anterior superior iliac spine on the left. In the midline, the rectus muscles appeared to be mostly intact but there was large hernia bulges lateral to the rectus muscles where the tissues had basically fallen apart. Superiorly we also dissected down to the scar tissue until we found the edge of the fascia which appeared to have been separated completely. We undermined this as well until we had a nice platform circumferentially for placing mesh. We spent a good time with hemostasis and creating the fascial flaps.  We closed the fascia transversely with about 25 interrupted sutures of #1 Novofil. We irrigated the wound. We then brought a 10 inch x 14 inch piece of polypropylene mesh to the operative field. We cut this down to size a little bit but used most of it in generally a large elliptical piece of tissue. This went all the way from just beyond the anterior superior iliac spine on the left to just beyond the anterior superior iliac spine on the right. The mesh was sutured in placed with interrupted mattress sutures of #0 Prolene and about 25 or 30 such sutures were placed. This provided a very secure anchoring of the mesh and covered the closed fascial incision quite nicely at least 4 or 5 cm in all directions. The wound was irrigated with  antibiotic irrigating solution. We placed four 19 French Blake drains, two on the right and two on the left and overlapped them in the wound. These were sutured in place with nylon sutures and connected to suction bulge. The subcutaneous tissue was closed with interrupted sutures of 2-0 Vicryl and skin closed with skin staples. Clean bandages were placed and the patient taken to the  recovery room in stable condition. Estimated blood loss was about 250 cc, complications none. Sponge, needle and instrument counts were correct. Dictated by:   Angelia Mould. Derrell Lolling, M.D. Attending Physician:  Brandy Hale DD:  05/04/01 TD:  05/05/01 Job: [id number]

## 2010-06-11 NOTE — Op Note (Signed)
[name], [name]                 ACCOUNT NO.:  [id number]   MEDICAL RECORD NO.:  [id number]          PATIENT TYPE:  OIB   LOCATION:  5035                         FACILITY:  [facility]   PHYSICIAN:  Burnard Bunting, M.D.    DATE OF BIRTH:  Feb 22, 1932   DATE OF PROCEDURE:  03/22/2005  DATE OF DISCHARGE:  03/23/2005                                 OPERATIVE REPORT   PREOPERATIVE DIAGNOSES:  1.  Left shoulder slap tear.  2.  Shoulder synovitis.  3.  Partial thickness rotator cuff tear.   POSTOPERATIVE DIAGNOSES:  1.  Left shoulder flap tear.  2.  Shoulder synovitis.  3.  Partial thickness rotator cuff tear.   PROCEDURE:  Left shoulder diagnostic and operative arthroscopy with biceps  tenotomy, extensive intra-articular debridement and subacromial  decompression.   SURGEON:  Burnard Bunting, M.D.   ASSISTANT:  None.   ANESTHESIA:  General endotracheal anesthesia.   ESTIMATED BLOOD LOSS:  Minimal.   FINDINGS:  1.  Examination under anesthesia range of motion 0 to 170 degrees of forward      flexion with external rotation and 15 degrees abduction to about 60      degrees, shoulder stability 1+ anterior posterior with less than 1 cm      __________.  2.  Diagnostic and operative arthroscopy.      1.  Type two slap tear degenerative nature with instability of the          biceps tendon.      2.  Mild degenerative changes glenohumeral surface.      3.  Intact inferior, anterior, and posterior inferior glenohumeral          ligament.      4.  Partial thickness rotator cuff tear in both articular and bursal          side.      5.  Significant bursitis.      6.  Potentially early frozen shoulder with significant synovitis in the          rotator interval.   DESCRIPTION OF PROCEDURE:  Patient brought to the operating room where  general endotracheal anesthesia was induced.  Patient  was placed in beach  chair position with the head in neutral position and the right arm well  padded.   Left arm was then prepped including the hand to the shoulder with  DuraPrep solution and draped in sterile manner.  Topographic anatomy of the  shoulder was identified including the posterolateral and anterior margin of  the acromion as well as coracoid process.  Solution of saline and  epinephrine was injected through the subacromial space.  The portal was  created 2 cm medial and inferior to the posterolateral margin of the  acromion.  Incision was made.  Portal was created.  Diagnostic arthroscopy  was performed.  Unstable biceps anchor and slap tear of a degenerative type  was identified.  Tenotomy was performed.  Early synovitis was present and  was extensively debrided.  Partial thickness rotator cuff tear was also  debrided.  Synovitis was  predominantly within the rotator interval which was  debrided.  This is all replaced in over the anterior portal.  Partial  thickness tearing of the rotator cuff was entered from the bursal side as  well which was debrided along with the bursitis.  The subacromial  decompression was then performed with release but not resection of the CA  ligament and debridement of bone spur.  Following this debridement,  instruments were removed from the portals which were closed using 3-0 nylon  suture.  Patient was placed in a bulky dressing and shoulder immobilizer.  The patient tolerated the procedure well without any complications.           ______________________________  G. Dorene Grebe, M.D.     GSD/MEDQ  D:  04/12/2005  T:  04/13/2005  Job:  [id number]

## 2010-06-11 NOTE — H&P (Signed)
[facility]  Patient:    [name], [name] Visit Number: [id number] MRN: [id number]          Service Type: SUR Location: 3W 0355 01 Attending Physician:  Londell Moh Dictated by:   Jamison Neighbor, M.D. Admit Date:  10/13/2000   CC:         Teena Irani. Odis Luster, M.D.  Ravi R. Felipa Eth, M.D.   History and Physical  ADMITTING DIAGNOSIS: 1. Recurrent stress urinary incontinence. 2. Vaginal wall prolapse. 3. Undesired abdominal pannus.  HISTORY OF PRESENT ILLNESS:  This is a 77 year old female who has stress urinary incontinence that did not respond with completion of her incontinence to a pubovaginal sling.  The patient was given the option of revision of the sling but had developed some additional problems with prolapse of the posterior vaginal vault.  She has requested that a more complete suspension be performed.  In addition, she is interested in having abdominoplasty done. The patient is to be admitted after a combined vaginosacropexy and Burch procedure along with a abdominoplasty by Dr. Etter Sjogren.  PAST MEDICAL HISTORY:  Remarkable for hypertension as well as borderline diabetes.  She is on no medications for this.  She is also known to suffer some esophageal reflux.  Urologic symptoms include some degree of frequency and urgency that may require anticholinergic therapy but she does have stress urinary incontinence as well.  CURRENT MEDICATIONS: 1. Allegra 180 mg daily p.r.n. 2. Valium 2 mg t.i.d. p.r.n. 3. Prozac 20 mg daily, 4. Norvasc 10 mg daily. 5. Potassium 40 mEq daily starting 10/05/00 for the management of low    potassium.  PAST SURGICAL HISTORY:  Pubovaginal sling, hysterectomy, hemorrhoid surgery, anterior repair, back surgery x 2, and cervical disk surgery x 2.  SOCIAL HISTORY:  The patient does not use tobacco or alcohol.  She is married and lives in [place].  REVIEW OF SYSTEMS:  Otherwise normal.  PHYSICAL  EXAMINATION:  Well-developed, well-nourished female in no acute distress.  VITAL SIGNS:  Pulse 72, respirations 20, blood pressure 130/86, weight 165.  HEENT:  Normocephalic, atraumatic,  Cranial nerves 2-12 grossly intact.  NECK:  Supple with no adenopathy or thyromegaly.  LUNGS:  Clear.  HEART:  Regular rate and rhythm.  No murmurs, thrills, gallops, rubs, or heaves.  ABDOMEN:  Soft, nontender with no palpable masses, rebound or guarding.  PELVIC:  She does have posterior prolapse.  Not a lot of rectocele.  EXTREMITIES:  No clubbing, cyanosis or edema.  IMPRESSION:  Vaginal vault prolapse with stress urinary incontinence.  PLAN:  Vaginosacropexy and Burch bladder neck suspension followed by abdominoplasty. Dictated by:   Jamison Neighbor, M.D. Attending Physician:  Londell Moh DD:  10/13/00 TD:  10/13/00 Job: [id number]

## 2010-06-11 NOTE — H&P (Signed)
[name], [name]                 ACCOUNT NO.:  [id number]   MEDICAL RECORD NO.:  [id number]          PATIENT TYPE:  EMS   LOCATION:  MAJO                         FACILITY:  [facility]   PHYSICIAN:  Larina Earthly, M.D.        DATE OF BIRTH:  04/16/1932   DATE OF ADMISSION:  08/11/2005  DATE OF DISCHARGE:                                HISTORY & PHYSICAL   CHIEF COMPLAINT:  Left shoulder pain, left chest pain associated with nausea  and shortness of breath.   HISTORY OF PRESENT ILLNESS:  This is a 77 year old Caucasian female, last  seen by me approximately 13 months ago, with multiple orthopedic issues,  including left shoulder surgery, occurring in early 2007 by Dr. Dorene Grebe,  using arthroscopic techniques, as well as a left total knee replacement by  Dr. Dorene Grebe within the last 4-5 weeks.  She does have multiple  cardiovascular risk factors, including type 2 diabetes mellitus,  hypertension, hyperlipidemia, and a family history of early coronary artery  disease.  She presents with left shoulder and left chest wall pain  associated with shortness of breath and nausea that started on the evening  of August 10, 2005 but progressed at 5:00 a.m. this morning.  She presented to  the emergency room by private vehicle.  Patient did state that she was doing  quite well with her total knee replacement recently and has been ambulating  slowly and did indeed vigorously clean her kitchen on the afternoon of August 10, 2005 using her left shoulder.  Of note, she did present to my office in  June, 2006, which is the last time I saw her, with some atypical chest  discomfort and a Cardiolite obtained at [facility] was  unremarkable.  Her diabetes has always been fairly well controlled with diet  and exercise, and blood pressure has been fairly well controlled, albeit not  optimized.  Her overall problem list is also complicated by anxiety and  depression disorder, for which she has a history  of using Prozac, Diazepam,  and Lexapro.  Her compliance with these medications has been intermittent,  at best, with the exception of the diazepam.  She has also been on  anticoagulation therapy secondary to her recent total knee replacement;  however, she states that home health has ceased coming to her house within  the last week, and she has an upcoming appointment with Dr. August Saucer next week  to discuss further treatment parameters.  She does continue on the Coumadin  at this time without any bleeding complications.   REVIEW OF SYSTEMS:  As above but negative for fevers, chills,  gastroesophageal reflux disease, change in bowel habits, blood in the stool,  and blood in the urine.  She states that she has been using oral narcotic  agents for pain relief on occasion.   PROBLEM LIST:  1. Type 2 diabetes mellitus.  2. Multiple spinal surgeries.  3. Pneumonia in 2001.  4. Gynecologist, Dr. Randell Patient.  5. History of depression and anxiety.  6. Family history of early coronary artery disease.  7. Hypertension.  8. Hyperlipidemia.  9. Ventral hernia repair in April, 2003 by Dr. Derrell Lolling.  10.History of hysterectomy.  11.Multiple urological procedures in the past.  12.Left shoulder arthroscopic surgery in early 2007 by Dr. Dorene Grebe.  13.Left total knee replacement in June, 2007 by Dr. Dorene Grebe.  14.History of noncompliance with all medications.   SOCIAL HISTORY:  The patient is widowed in December, 2003 and is retired  from [employer].  Has no history of alcohol or tobacco abuse.   FAMILY HISTORY:  Significant for father having died at the age of 38 of type  2 diabetes and early coronary artery disease.  Mother died at the age of 21  of coronary artery disease and type 2 diabetes mellitus.  Patient does have  two sisters with type 2 diabetes mellitus and a history of gynecological  cancer.  Patient has four children who are apparently alive and well.   Patient has no known drug  allergies.   CURRENT MEDICATIONS:  Only include Norvasc 5 mg each day, Coumadin, and pain  medications ordered by Dr. August Saucer.  She does have a history of using diazepam  up to 5 mg t.i.d., Lexapro 10 mg each day, Vytorin 10/40 1 p.o. daily,  Prilosec 20-40 mg each day, along with Advil p.r.n.   LABORATORY EVALUATION:  EKG reveals sinus tachycardia with no acute changes  on the EKG.   Chest x-ray reveals no apparent disease with possibly some basilar  atelectasis.   Chest CT obtained secondary to elevated D-dimer.  Was unremarkable for PE or  acute disease.   Sodium 138, potassium 3.1, BUN 8, creatinine 0.6, glucose 274.  White blood  cell count 5.4, hemoglobin 12.1, hematocrit 37.2%, platelet count 328.  CK-  MB 1.5.  Myoglobin 74.5.  Troponin I less than 0.05.  PT/INR 4.1.  D-dimer  elevated at 2.79.   PHYSICAL EXAMINATION:  GENERAL:  We have a slightly obese Caucasian female  sitting in the bed in no apparent distress, answering all questions  appropriately.  Alert and oriented x3.  HEENT:  Sclerae are anicteric.  Extraocular movements are intact.  There are  no oropharyngeal lesions.  NECK:  There is no cervical lymphadenopathy.  There are no carotid bruits.  There is no thyromegaly.  LUNGS:  Clear to auscultation bilaterally.  CARDIOVASCULAR:  Regular rate and rhythm.  ABDOMEN:  Soft, nontender, nondistended.  Bowel sounds are present.  MUSCULOSKELETAL:  There is a minimal amount of tenderness to palpation over  the left anterior chest wall as well as over the left anterior shoulder.  There is decreased range of motion with respect to the left shoulder, where  she cannot adduct greater than 30-35%.  EXTREMITIES:  Lower extremity exam reveals no edema with pedal pulses  intact.  Venous insufficiency changes are present.  NEUROLOGIC:  Grossly nonfocal.   ASSESSMENT/PLAN: 1. Chest pain with left shoulder pain, resolved with intravenous nitrates,      morphine and Ativan given  in the emergency room.  Her exam is notable      for decreased range of motion of the left shoulder, which is worse,      compared to her normal baseline, and complicated by multiple      cardiovascular risk factors.  Will admit to telemetry and rule out for      myocardial infarction.  Will continue morphine and nitrites and obtain      consultation from cardiology as well as orthopedics, given her recent  injuries and significant orthopedic findings on physical examination.  2. Type 2 diabetes mellitus:  Currently on diet and exercise; however,      spot check is 274 in the emergency room.  We will start her on sliding-      scale insulin.  3. Hypertension:  Currently on calcium channel blockade.  Will add an ACE      inhibitor and monitor for improvement.  4. Hyperlipidemia:  Currently the patient is noncompliant with Vytorin on      a regular basis but will reassess.  5. History of recent total knee replacement per Dr. August Saucer.  Will continue      Coumadin for the time being but if the patient rules out for myocardial      infarction and has no active cardiac issues based on evaluation by      cardiology, will question the continued need for Coumadin and will      defer to Dr. August Saucer.  Currently, left knee is slightly swollen, status      post surgery, but there is no erythema or warmth on exam.      Larina Earthly, M.D.  Electronically Signed     RA/MEDQ  D:  08/11/2005  T:  08/11/2005  Job:  [id number]   cc:   G. Dorene Grebe, M.D.  [facility]

## 2010-06-11 NOTE — Op Note (Signed)
[facility]  Patient:    [name], [name] Visit Number: [id number] MRN: [id number]          Service Type: SUR Location: 3W 0355 01 Attending Physician:  Londell Moh Dictated by:   Teena Irani. Odis Luster, M.D. Admit Date:  10/13/2000                             Operative Report  PREOPERATIVE DIAGNOSIS:  Excess abdominal skin.  POSTOPERATIVE DIAGNOSIS:  Excess abdominal skin.  PROCEDURE:  Abdominoplasty.  SURGEON:  Teena Irani. Odis Luster, M.D.  ANESTHESIA:  DRAINS:  Three Blakes left.  CLINICAL NOTE:  A 77 year old woman has excess skin of the abdomen for which she desires an abdominoplasty.  She has had one in the past but was disappointed with some redundant skin that was left.  She is undergoing a urologic procedure today and she was hoping that these could be combined.  Her urologist, Dr. Logan Bores, had no objections and abdominoplasty was planned.  This procedure, the risks and possible complications were discussed with her in great detail including the informed consent form which was discussed line by line for possible complications.  She understood all of this and wished to proceed.  DESCRIPTION OF PROCEDURE:  The patient was in the operating room and the urologic procedure had been completed.  The wound was covered with a sterile, moist saline towel.  She was prepped with Betadine and draped with a sterile drapes and then the skin incision was then extended laterally to the limits of the incision that been marked preoperatively with the patient in standing position.  The dissection was then carried out in a cephalad direction to the limits of the skin that was felt to be in excess but no further.  Incision made around the umbilicus taking the fatty stalk with it to insure its survival.  It was dissected free from surrounding tissue.  Meticulous hemostasis with electrocautery.  The lateral fascia well out lateral over the oblique muscles was  imbricated using 2-0 PDS simple interrupted sutures inverted.  Great care was taken to avoid damage to the underlying intra-abdominal contents with this plication.  The patient was then placed in a semi-Fowler position in the operating room and the amount of skin was then marked for amputation.  This was excised and meticulous hemostasis with electrocautery.  Thorough irrigation with saline and again meticulous hemostasis with electrocautery.  Blake drains were positioned; a total of three; one for each lateral aspect and one centrally.  These were brought out through separate stab wounds inferiorly and secured with 3-0 Prolene sutures. The closure with 2-0 Vicryl inverted deep sutures advancing the skin edges toward the midline.  It was felt that the umbilicus would be best brought out through the central aspect of the incision as the skin was advanced centrally. This left a little tiny vertical extension.  This was extended for 1 cm and umbilicus brought up through this opening.  The skin edges were then approximated in the vertical midline using 2-0 Vicryl in an interrupted inverted deep suture and 4-0 nylon simple interrupted suture.  Umbilical in-setting with 2-0 Vicryl interrupted inverted deep sutures and 4-0 nylon simple interrupted sutures.  The umbilicus was found to have excellent color and capillary refill with bright red bleeding surrounding it periphery.  All kin edges were healthy.  No evidence of any vascular compromise.  The remainder of the skin closure with a running  3-0 Monocryl subcuticular suture. Steri-Strips, dry sterile dressing were then applied very lightly without compression.  She was transferred to the recovery room in stable condition having tolerated the procedure well. Dictated by:   Teena Irani. Odis Luster, M.D. Attending Physician:  Londell Moh DD:  10/13/00 TD:  10/13/00 Job: [id number]

## 2010-06-11 NOTE — Op Note (Signed)
[facility]  Patient:    [name], [name]                        MRN: [id number] Proc. Date: 01/11/00 Adm. Date:  [id number] Attending:  Londell Moh                           Operative Report  PREOPERATIVE DIAGNOSIS:  Stress urinary incontinence.  POSTOPERATIVE DIAGNOSIS:  Stress urinary incontinence.  PROCEDURE PERFORMED:  Cystoscopy, suprapubic tube placement and pubovaginal sling.  SURGEON:  Jamison Neighbor, M.D.  ANESTHESIA:  General.  COMPLICATIONS:  None.  DRAINS:  Banana cystocath.  BRIEF HISTORY:  This 77 year old female has stress urinary incontinence. Urodynamic evaluation demonstrated that she does not have any uncontrolled bladder contractility, thus a diminished lead point pressure.  The patient is status post hysterectomy with anterior and posterior repair and does not have a significant cystocele.  It is felt that the patient will respond to a standard pubovaginal sling.  She understands the risks and benefits of the procedure including the possibility of postoperative retention, postoperative urgency, incomplete emptying, and incomplete correction of her stress incontinence.  She gave full and informed consent.  DESCRIPTION OF PROCEDURE:  After the successful induction of general anesthesia, the patient was placed in the low lithotomy position, prepped with Betadine and draped in the usual sterile fashion.  A weighted vaginal speculum was placed and labial sutures were used to expose the anterior vaginal wall. The anterior vaginal wall was anesthetized with a local anesthetic.  While that was setting up, a small incision not much wider than one fingerbreadth was made directly above the pubis and carried down sharply through Scarpas fascia until the rectus sheath was identified.  This was packed off and an incision was made in the anterior vaginal wall beginning at the mid urethral complex extending back to the bladder neck.   Flaps of vaginal mucosa were raised.  Posterior resection was used and this was dissected all the way back to the endopelvic fascia.  The sling was then composed of a 2 x 7 piece of tutoplast with the ends oversewn with #1 nylon.  A tonsil clamp was passed from the abdominal incision down to the vaginal and used to grasped the #1 nylon on each side and the sling was elevated.  The sling was tacked down with 2-0 Vicryl in order to prevent it from curling.  It nicely bridged the space between the major urethral complex and the bladder neck.  The anterior vaginal mucosa was then closed with a running suture of 2-0 Vicryl.  Cystoscopy was performed and the bladder was carefully inspected.  It was free of any tumor or stone.  Both ureteral orifices were normal in configuration and location. There was no sign of any injury to the bladder and no sling material or suture material anywhere within the bladder.  Under direct vision with the bladder Foley, a Banana cystocath was passed through a small stab incision 1-2 fingerbreadth just above the incision.  This was allowed to coil.  This was sewn in place with nylon sutures.  The sling was then tied down with an appropriate degree of tension.  Several steps were taken to avoid over suspension.  This was done under direct vision and the skin was elevated to the point where there was coaptation, but no angulation of the urethra.  There was still 30-40 degrees  of play with the cystoscope.  The patient had two fingerbreadths between the suture and the abdominal wall, and with the full bladder, there was a prompt straight flow of urine without spraying.  The sling was then completely tied down.  The incision was then irrigated with antibiotic solution and closed with surgical clips.  The patient tolerated the procedure well and was taken to the recovery room in good condition.  She has a vaginal pack in place.  The cystocath was placed to straight  drainage.  She will have a voiding trial tomorrow with anticipated discharge postoperative day #1. DD:  01/11/00 TD:  01/12/00 Job: [id number]

## 2010-06-11 NOTE — Discharge Summary (Signed)
[facility]  Patient:    [name], [name] Visit Number: [id number] MRN: [id number]          Service Type: SUR Location: [id number] Attending Physician:  Brandy Hale Dictated by:   Angelia Mould. Derrell Lolling, M.D. Admit Date:  05/04/2001 Discharge Date: 05/08/2001   CC:         Jamison Neighbor, M.D.  Ravi R. Felipa Eth, M.D.   Discharge Summary  FINAL DIAGNOSES: 1. Complex ventral hernia. 2. Hypertension. 3. Status post two abdominoplasties. 4. Status post hysterectomy. 5. Status post multiple urologic procedures.  OPERATION:  Open onlay repair of complex ventral hernia with mesh.  Date of surgery May 04, 2001.  HISTORY:  This is a 77 year old white female who has had multiple abdominal operations as outlined in her history.  This includes C section, hysterectomy, two abdominoplasties, at least two open bladder procedures.  She has noted some bulges on the right and left lateral aspects of her low transverse incision for a few months.  This is painful on the right.  PHYSICAL EXAMINATION:  GENERAL:  She is a pleasant older woman.  ABDOMEN:  Somewhat obese.  Well-healed lower transverse incision.  Reducible hernia in the right lower quadrant and reducible hernia in the left lower quadrant, apparently low but within the incision lines.  PLAN:  She is brought to the operating room and hospital electively.  HOSPITAL COURSE:  On the day of surgery, the patient was taken to the operating room by me and Dr. Logan Bores.  We opened up the transverse incision widely and exposed bilateral large hernias.  We dissected out the hernia sacs and repaired this area with a single large piece of onlay mesh.  Postoperatively, the patient did well.  She had moderate light drainage form her Jackson-Pratt drains throughout her hospital course and was sent home with the drains.  She had some problems with pain control early on, but with modification, that came under  nice control.  She advanced her diet and activities without too much trouble.  Her Foley catheter was removed on April 14, and she was advanced to a regular diet on that date, and she did well and was able to go home on April 15.  At that time, her abdomen was soft, she was tolerating diet, voiding well.  Her wound looked fine.  She was given a prescription for Vicodin for pain.  She was instructed in diet and activities.  She was asked to return to see me in the office in four to five days. Dictated by:   Angelia Mould. Derrell Lolling, M.D. Attending Physician:  Brandy Hale DD:  05/18/01 TD:  05/19/01 Job: [phone] [id number]

## 2010-10-21 LAB — URINALYSIS, ROUTINE W REFLEX MICROSCOPIC
Bilirubin Urine: NEGATIVE
Nitrite: NEGATIVE
Specific Gravity, Urine: 1.014
Urobilinogen, UA: 1
pH: 6.5

## 2010-10-21 LAB — URINE MICROSCOPIC-ADD ON

## 2010-10-21 LAB — GLUCOSE, RANDOM: Glucose, Bld: 459 — ABNORMAL HIGH

## 2010-10-21 LAB — COMPREHENSIVE METABOLIC PANEL
AST: 19
Albumin: 4
BUN: 12
Calcium: 9.1
Creatinine, Ser: 0.9
GFR calc Af Amer: >60
GFR calc non Af Amer: >60
Total Bilirubin: 0.9

## 2010-10-21 LAB — CBC
HCT: 39.9
MCHC: 33.7
MCV: 89.4
Platelets: 266

## 2010-10-21 LAB — HEMOGLOBIN A1C: Hgb A1c MFr Bld: 7.5 — ABNORMAL HIGH

## 2010-10-21 LAB — PROTIME-INR: INR: 1

## 2010-10-29 LAB — DIFFERENTIAL
Basophils Absolute: 0 10*3/uL (ref 0.0–0.1)
Basophils Absolute: 0 10*3/uL (ref 0.0–0.1)
Basophils Relative: 0 % (ref 0–1)
Eosinophils Absolute: 0.1 10*3/uL (ref 0.0–0.7)
Eosinophils Absolute: 0.3 10*3/uL (ref 0.0–0.7)
Eosinophils Relative: 4 % (ref 0–5)
Lymphocytes Relative: 19 % (ref 12–46)
Lymphs Abs: 1.7 10*3/uL (ref 0.7–4.0)
Monocytes Absolute: 0.6 10*3/uL (ref 0.1–1.0)
Neutro Abs: 8.1 10*3/uL — ABNORMAL HIGH (ref 1.7–7.7)

## 2010-10-29 LAB — CBC
HCT: 28.4 % — ABNORMAL LOW (ref 36.0–46.0)
HCT: 29.8 % — ABNORMAL LOW (ref 36.0–46.0)
HCT: 35.6 % — ABNORMAL LOW (ref 36.0–46.0)
Hemoglobin: 10 g/dL — ABNORMAL LOW (ref 12.0–15.0)
Hemoglobin: 11.8 g/dL — ABNORMAL LOW (ref 12.0–15.0)
Hemoglobin: 9.4 g/dL — ABNORMAL LOW (ref 12.0–15.0)
Hemoglobin: 9.5 g/dL — ABNORMAL LOW (ref 12.0–15.0)
MCHC: 33 g/dL (ref 30.0–36.0)
MCHC: 33.4 g/dL (ref 30.0–36.0)
MCHC: 34 g/dL (ref 30.0–36.0)
MCHC: 35.9 g/dL (ref 30.0–36.0)
MCV: 88.8 fL (ref 78.0–100.0)
MCV: 88.9 fL (ref 78.0–100.0)
MCV: 89.4 fL (ref 78.0–100.0)
Platelets: 298 10*3/uL (ref 150–400)
Platelets: 341 10*3/uL (ref 150–400)
RBC: 2.83 MIL/uL — ABNORMAL LOW (ref 3.87–5.11)
RBC: 3.14 MIL/uL — ABNORMAL LOW (ref 3.87–5.11)
RBC: 3.2 MIL/uL — ABNORMAL LOW (ref 3.87–5.11)
RBC: 3.21 MIL/uL — ABNORMAL LOW (ref 3.87–5.11)
RDW: 13 % (ref 11.5–15.5)
RDW: 13.5 % (ref 11.5–15.5)
RDW: 13.5 % (ref 11.5–15.5)
RDW: 13.7 % (ref 11.5–15.5)
RDW: 13.8 % (ref 11.5–15.5)
WBC: 11.2 10*3/uL — ABNORMAL HIGH (ref 4.0–10.5)
WBC: 8 10*3/uL (ref 4.0–10.5)
WBC: 9.3 10*3/uL (ref 4.0–10.5)

## 2010-10-29 LAB — POCT I-STAT, CHEM 8
BUN: 17 mg/dL (ref 6–23)
Calcium, Ion: 1.14 mmol/L (ref 1.12–1.32)
Creatinine, Ser: 1 mg/dL (ref 0.4–1.2)
Sodium: 138 mEq/L (ref 135–145)
TCO2: 28 mmol/L (ref 0–100)

## 2010-10-29 LAB — COMPREHENSIVE METABOLIC PANEL
ALT: 14 U/L (ref 0–35)
ALT: 17 U/L (ref 0–35)
AST: 15 U/L (ref 0–37)
AST: 19 U/L (ref 0–37)
Albumin: 2.7 g/dL — ABNORMAL LOW (ref 3.5–5.2)
Alkaline Phosphatase: 57 U/L (ref 39–117)
Alkaline Phosphatase: 61 U/L (ref 39–117)
BUN: 13 mg/dL (ref 6–23)
CO2: 27 mEq/L (ref 19–32)
CO2: 27 mEq/L (ref 19–32)
CO2: 32 mEq/L (ref 19–32)
Calcium: 8.2 mg/dL — ABNORMAL LOW (ref 8.4–10.5)
Chloride: 102 mEq/L (ref 96–112)
Chloride: 103 mEq/L (ref 96–112)
Creatinine, Ser: 0.87 mg/dL (ref 0.4–1.2)
GFR calc Af Amer: >60 mL/min (ref >60)
GFR calc non Af Amer: >60 mL/min (ref >60)
GFR calc non Af Amer: >60 mL/min (ref >60)
Glucose, Bld: 161 mg/dL — ABNORMAL HIGH (ref 70–99)
Glucose, Bld: 245 mg/dL — ABNORMAL HIGH (ref 70–99)
Potassium: 4.3 mEq/L (ref 3.5–5.1)
Sodium: 136 mEq/L (ref 135–145)
Sodium: 140 mEq/L (ref 135–145)
Total Bilirubin: 0.6 mg/dL (ref 0.3–1.2)
Total Bilirubin: 0.7 mg/dL (ref 0.3–1.2)

## 2010-10-29 LAB — GLUCOSE, CAPILLARY
Glucose-Capillary: 111 mg/dL — ABNORMAL HIGH (ref 70–99)
Glucose-Capillary: 119 mg/dL — ABNORMAL HIGH (ref 70–99)
Glucose-Capillary: 127 mg/dL — ABNORMAL HIGH (ref 70–99)
Glucose-Capillary: 127 mg/dL — ABNORMAL HIGH (ref 70–99)
Glucose-Capillary: 129 mg/dL — ABNORMAL HIGH (ref 70–99)
Glucose-Capillary: 139 mg/dL — ABNORMAL HIGH (ref 70–99)
Glucose-Capillary: 141 mg/dL — ABNORMAL HIGH (ref 70–99)
Glucose-Capillary: 144 mg/dL — ABNORMAL HIGH (ref 70–99)
Glucose-Capillary: 149 mg/dL — ABNORMAL HIGH (ref 70–99)
Glucose-Capillary: 154 mg/dL — ABNORMAL HIGH (ref 70–99)
Glucose-Capillary: 156 mg/dL — ABNORMAL HIGH (ref 70–99)
Glucose-Capillary: 161 mg/dL — ABNORMAL HIGH (ref 70–99)
Glucose-Capillary: 164 mg/dL — ABNORMAL HIGH (ref 70–99)
Glucose-Capillary: 167 mg/dL — ABNORMAL HIGH (ref 70–99)
Glucose-Capillary: 169 mg/dL — ABNORMAL HIGH (ref 70–99)
Glucose-Capillary: 171 mg/dL — ABNORMAL HIGH (ref 70–99)
Glucose-Capillary: 180 mg/dL — ABNORMAL HIGH (ref 70–99)
Glucose-Capillary: 185 mg/dL — ABNORMAL HIGH (ref 70–99)

## 2010-10-29 LAB — BASIC METABOLIC PANEL
CO2: 29 mEq/L (ref 19–32)
CO2: 29 mEq/L (ref 19–32)
Calcium: 8.4 mg/dL (ref 8.4–10.5)
Calcium: 9 mg/dL (ref 8.4–10.5)
Chloride: 103 mEq/L (ref 96–112)
Creatinine, Ser: 0.69 mg/dL (ref 0.4–1.2)
Creatinine, Ser: 0.72 mg/dL (ref 0.4–1.2)
GFR calc Af Amer: >60 mL/min (ref >60)
GFR calc Af Amer: >60 mL/min (ref >60)
GFR calc non Af Amer: >60 mL/min (ref >60)
GFR calc non Af Amer: >60 mL/min (ref >60)
Glucose, Bld: 137 mg/dL — ABNORMAL HIGH (ref 70–99)
Glucose, Bld: 137 mg/dL — ABNORMAL HIGH (ref 70–99)
Glucose, Bld: 176 mg/dL — ABNORMAL HIGH (ref 70–99)
Potassium: 3.9 mEq/L (ref 3.5–5.1)
Sodium: 140 mEq/L (ref 135–145)

## 2010-10-29 LAB — URINALYSIS, ROUTINE W REFLEX MICROSCOPIC
Nitrite: NEGATIVE
Specific Gravity, Urine: 1.023 (ref 1.005–1.030)
Urobilinogen, UA: 1 mg/dL (ref 0.0–1.0)
pH: 6 (ref 5.0–8.0)

## 2010-10-29 LAB — URINE MICROSCOPIC-ADD ON

## 2011-09-07 ENCOUNTER — Encounter: Payer: Self-pay | Admitting: *Deleted

## 2011-09-13 ENCOUNTER — Ambulatory Visit: Payer: Self-pay | Admitting: Gastroenterology

## 2012-03-11 ENCOUNTER — Encounter (HOSPITAL_COMMUNITY): Payer: Self-pay | Admitting: Emergency Medicine

## 2012-03-11 ENCOUNTER — Emergency Department (HOSPITAL_COMMUNITY): Payer: Medicare Other

## 2012-03-11 ENCOUNTER — Emergency Department (HOSPITAL_COMMUNITY)
Admission: EM | Admit: 2012-03-11 | Discharge: 2012-03-12 | Disposition: A | Discharge status: Home / Self Care | Payer: Medicare Other | Attending: Emergency Medicine | Admitting: Emergency Medicine

## 2012-03-11 DIAGNOSIS — Z8673 Personal history of transient ischemic attack (TIA), and cerebral infarction without residual deficits: Secondary | ICD-10-CM | POA: Insufficient documentation

## 2012-03-11 DIAGNOSIS — I10 Essential (primary) hypertension: Secondary | ICD-10-CM | POA: Insufficient documentation

## 2012-03-11 DIAGNOSIS — Z79899 Other long term (current) drug therapy: Secondary | ICD-10-CM | POA: Insufficient documentation

## 2012-03-11 DIAGNOSIS — M542 Cervicalgia: Secondary | ICD-10-CM

## 2012-03-11 DIAGNOSIS — F341 Dysthymic disorder: Secondary | ICD-10-CM | POA: Insufficient documentation

## 2012-03-11 DIAGNOSIS — R209 Unspecified disturbances of skin sensation: Secondary | ICD-10-CM | POA: Insufficient documentation

## 2012-03-11 DIAGNOSIS — E119 Type 2 diabetes mellitus without complications: Secondary | ICD-10-CM | POA: Insufficient documentation

## 2012-03-11 DIAGNOSIS — R202 Paresthesia of skin: Secondary | ICD-10-CM

## 2012-03-11 DIAGNOSIS — Z8719 Personal history of other diseases of the digestive system: Secondary | ICD-10-CM | POA: Insufficient documentation

## 2012-03-11 DIAGNOSIS — E785 Hyperlipidemia, unspecified: Secondary | ICD-10-CM | POA: Insufficient documentation

## 2012-03-11 DIAGNOSIS — Z91199 Patient's noncompliance with other medical treatment and regimen due to unspecified reason: Secondary | ICD-10-CM | POA: Insufficient documentation

## 2012-03-11 DIAGNOSIS — Z8701 Personal history of pneumonia (recurrent): Secondary | ICD-10-CM | POA: Insufficient documentation

## 2012-03-11 DIAGNOSIS — Z9119 Patient's noncompliance with other medical treatment and regimen: Secondary | ICD-10-CM | POA: Insufficient documentation

## 2012-03-11 HISTORY — DX: Pneumonia, unspecified organism: J18.9

## 2012-03-11 LAB — COMPREHENSIVE METABOLIC PANEL
ALT: 10 U/L (ref 0–35)
CO2: 26 mEq/L (ref 19–32)
Calcium: 9.2 mg/dL (ref 8.4–10.5)
Creatinine, Ser: 0.67 mg/dL (ref 0.50–1.10)
GFR calc Af Amer: >90 mL/min (ref >90)
GFR calc non Af Amer: 82 mL/min — ABNORMAL LOW (ref >90)
Glucose, Bld: 144 mg/dL — ABNORMAL HIGH (ref 70–99)
Sodium: 138 mEq/L (ref 135–145)

## 2012-03-11 LAB — DIFFERENTIAL
Eosinophils Relative: 4 % (ref 0–5)
Lymphocytes Relative: 20 % (ref 12–46)
Lymphs Abs: 1.6 10*3/uL (ref 0.7–4.0)
Monocytes Absolute: 0.5 10*3/uL (ref 0.1–1.0)
Monocytes Relative: 7 % (ref 3–12)

## 2012-03-11 LAB — CBC
HCT: 35.7 % — ABNORMAL LOW (ref 36.0–46.0)
Hemoglobin: 12.6 g/dL (ref 12.0–15.0)
MCV: 89 fL (ref 78.0–100.0)
RBC: 4.01 MIL/uL (ref 3.87–5.11)
WBC: 7.9 10*3/uL (ref 4.0–10.5)

## 2012-03-11 LAB — POCT I-STAT TROPONIN I

## 2012-03-11 MED ORDER — HYDROCODONE-ACETAMINOPHEN 5-325 MG PO TABS
2.0000 | ORAL_TABLET | Freq: Once | ORAL | Status: AC
  Administered 2012-03-12: 2 via ORAL
  Filled 2012-03-11: qty 2
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
## 2012-03-11 NOTE — ED Notes (Signed)
Pt. Reports pain in right neck and right shoulder. States 4 days ago " I woke up and my entire right side of body was paralyzed". Reports hx of TIA, reports similar symptoms. Pt. NIH 0. Pt denies dizziness, HA, numbness/tingling. Pt. Right neck and right arm tender to touch. States "It feels like I'm having muscle cramps".

## 2012-03-11 NOTE — ED Notes (Addendum)
Pt states she had R sided weakness and R sided numbness that started when she woke up 5 days ago and she was unable to walk.  States she is still having numbness in R shoulder, R side of neck, and into R side of back.  Family reports pt has had confusion over the past 5 days and symptoms seem to be intermittent. Grips strong and equal, speech clear, no facial droop, no arm drift, no leg weakness noted on triage exam.  When pt's pain assessed she is also c/o pain in R shoulder and R side of neck where she reports numbness.

## 2012-03-12 ENCOUNTER — Emergency Department (HOSPITAL_COMMUNITY): Payer: Medicare Other

## 2012-03-12 MED ORDER — HYDROCODONE-ACETAMINOPHEN 5-325 MG PO TABS: 2.0000 | ORAL_TABLET | Freq: Four times a day (QID) | ORAL | Status: DC | PRN

## 2013-04-26 ENCOUNTER — Encounter (HOSPITAL_COMMUNITY): Payer: Self-pay | Admitting: Emergency Medicine

## 2013-04-26 ENCOUNTER — Emergency Department (HOSPITAL_COMMUNITY)
Admission: EM | Admit: 2013-04-26 | Discharge: 2013-04-27 | Disposition: A | Discharge status: Home / Self Care | Payer: No Typology Code available for payment source | Attending: Emergency Medicine | Admitting: Emergency Medicine

## 2013-04-26 DIAGNOSIS — S298XXA Other specified injuries of thorax, initial encounter: Secondary | ICD-10-CM | POA: Insufficient documentation

## 2013-04-26 DIAGNOSIS — S0993XA Unspecified injury of face, initial encounter: Secondary | ICD-10-CM | POA: Insufficient documentation

## 2013-04-26 DIAGNOSIS — Y92410 Unspecified street and highway as the place of occurrence of the external cause: Secondary | ICD-10-CM | POA: Insufficient documentation

## 2013-04-26 DIAGNOSIS — Y9389 Activity, other specified: Secondary | ICD-10-CM | POA: Insufficient documentation

## 2013-04-26 DIAGNOSIS — R928 Other abnormal and inconclusive findings on diagnostic imaging of breast: Secondary | ICD-10-CM | POA: Insufficient documentation

## 2013-04-26 DIAGNOSIS — N63 Unspecified lump in unspecified breast: Secondary | ICD-10-CM

## 2013-04-26 DIAGNOSIS — Z90710 Acquired absence of both cervix and uterus: Secondary | ICD-10-CM | POA: Insufficient documentation

## 2013-04-26 DIAGNOSIS — E119 Type 2 diabetes mellitus without complications: Secondary | ICD-10-CM | POA: Insufficient documentation

## 2013-04-26 DIAGNOSIS — Z96659 Presence of unspecified artificial knee joint: Secondary | ICD-10-CM | POA: Insufficient documentation

## 2013-04-26 DIAGNOSIS — Z91199 Patient's noncompliance with other medical treatment and regimen due to unspecified reason: Secondary | ICD-10-CM | POA: Insufficient documentation

## 2013-04-26 DIAGNOSIS — Z79899 Other long term (current) drug therapy: Secondary | ICD-10-CM | POA: Diagnosis not present

## 2013-04-26 DIAGNOSIS — Z791 Long term (current) use of non-steroidal anti-inflammatories (NSAID): Secondary | ICD-10-CM | POA: Insufficient documentation

## 2013-04-26 DIAGNOSIS — Z9889 Other specified postprocedural states: Secondary | ICD-10-CM | POA: Diagnosis not present

## 2013-04-26 DIAGNOSIS — S8990XA Unspecified injury of unspecified lower leg, initial encounter: Secondary | ICD-10-CM | POA: Insufficient documentation

## 2013-04-26 DIAGNOSIS — F3289 Other specified depressive episodes: Secondary | ICD-10-CM | POA: Diagnosis not present

## 2013-04-26 DIAGNOSIS — S99911A Unspecified injury of right ankle, initial encounter: Secondary | ICD-10-CM

## 2013-04-26 DIAGNOSIS — Z8701 Personal history of pneumonia (recurrent): Secondary | ICD-10-CM | POA: Insufficient documentation

## 2013-04-26 DIAGNOSIS — IMO0002 Reserved for concepts with insufficient information to code with codable children: Secondary | ICD-10-CM | POA: Insufficient documentation

## 2013-04-26 DIAGNOSIS — F329 Major depressive disorder, single episode, unspecified: Secondary | ICD-10-CM | POA: Insufficient documentation

## 2013-04-26 DIAGNOSIS — S199XXA Unspecified injury of neck, initial encounter: Secondary | ICD-10-CM | POA: Diagnosis not present

## 2013-04-26 DIAGNOSIS — E785 Hyperlipidemia, unspecified: Secondary | ICD-10-CM | POA: Insufficient documentation

## 2013-04-26 DIAGNOSIS — Z9119 Patient's noncompliance with other medical treatment and regimen: Secondary | ICD-10-CM | POA: Diagnosis not present

## 2013-04-26 DIAGNOSIS — R404 Transient alteration of awareness: Secondary | ICD-10-CM | POA: Diagnosis not present

## 2013-04-26 DIAGNOSIS — F411 Generalized anxiety disorder: Secondary | ICD-10-CM | POA: Diagnosis not present

## 2013-04-26 DIAGNOSIS — S99929A Unspecified injury of unspecified foot, initial encounter: Principal | ICD-10-CM

## 2013-04-26 DIAGNOSIS — Z8719 Personal history of other diseases of the digestive system: Secondary | ICD-10-CM | POA: Diagnosis not present

## 2013-04-26 DIAGNOSIS — S301XXA Contusion of abdominal wall, initial encounter: Secondary | ICD-10-CM | POA: Diagnosis not present

## 2013-04-26 DIAGNOSIS — S99919A Unspecified injury of unspecified ankle, initial encounter: Principal | ICD-10-CM

## 2013-04-26 DIAGNOSIS — I10 Essential (primary) hypertension: Secondary | ICD-10-CM | POA: Insufficient documentation

## 2013-04-26 LAB — CBC
HCT: 37.2 % (ref 36.0–46.0)
Hemoglobin: 13.1 g/dL (ref 12.0–15.0)
MCH: 31.2 pg (ref 26.0–34.0)
MCHC: 35.2 g/dL (ref 30.0–36.0)
MCV: 88.6 fL (ref 78.0–100.0)
PLATELETS: 249 10*3/uL (ref 150–400)
RBC: 4.2 MIL/uL (ref 3.87–5.11)
RDW: 12.6 % (ref 11.5–15.5)
WBC: 14.2 10*3/uL — AB (ref 4.0–10.5)

## 2013-04-26 MED ORDER — SODIUM CHLORIDE 0.9 % IV BOLUS (SEPSIS)
125.0000 mL | Freq: Once | INTRAVENOUS | Status: AC
  Administered 2013-04-26: 125 mL via INTRAVENOUS

## 2013-04-26 MED ORDER — FENTANYL CITRATE 0.05 MG/ML IJ SOLN
50.0000 ug | INTRAMUSCULAR | Status: DC | PRN
  Administered 2013-04-26 – 2013-04-27 (×2): 50 ug via INTRAVENOUS
  Filled 2013-04-26 (×2): qty 2
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
## 2013-04-27 ENCOUNTER — Emergency Department (HOSPITAL_COMMUNITY): Payer: No Typology Code available for payment source

## 2013-04-27 DIAGNOSIS — S8990XA Unspecified injury of unspecified lower leg, initial encounter: Secondary | ICD-10-CM | POA: Diagnosis not present

## 2013-04-27 DIAGNOSIS — S99919A Unspecified injury of unspecified ankle, initial encounter: Secondary | ICD-10-CM | POA: Diagnosis not present

## 2013-04-27 LAB — RAPID URINE DRUG SCREEN, HOSP PERFORMED
Amphetamines: NOT DETECTED
Barbiturates: NOT DETECTED
Benzodiazepines: POSITIVE — AB
COCAINE: NOT DETECTED
OPIATES: NOT DETECTED
Tetrahydrocannabinol: NOT DETECTED

## 2013-04-27 LAB — ETHANOL

## 2013-04-27 LAB — BASIC METABOLIC PANEL
BUN: 23 mg/dL (ref 6–23)
CHLORIDE: 104 meq/L (ref 96–112)
CO2: 22 meq/L (ref 19–32)
Calcium: 8.6 mg/dL (ref 8.4–10.5)
Creatinine, Ser: 0.83 mg/dL (ref 0.50–1.10)
GFR calc non Af Amer: 65 mL/min — ABNORMAL LOW (ref >90)
GFR, EST AFRICAN AMERICAN: 76 mL/min — AB (ref >90)
Glucose, Bld: 153 mg/dL — ABNORMAL HIGH (ref 70–99)
Potassium: 3.5 mEq/L — ABNORMAL LOW (ref 3.7–5.3)
SODIUM: 142 meq/L (ref 137–147)

## 2013-04-27 LAB — CK TOTAL AND CKMB (NOT AT ARMC)
CK, MB: 5.9 ng/mL — AB (ref 0.3–4.0)
Relative Index: 2.1 (ref 0.0–2.5)
Total CK: 285 U/L — ABNORMAL HIGH (ref 7–177)

## 2013-04-27 MED ORDER — IOHEXOL 300 MG/ML  SOLN
100.0000 mL | Freq: Once | INTRAMUSCULAR | Status: AC | PRN
  Administered 2013-04-27: 100 mL via INTRAVENOUS

## 2013-04-27 MED ORDER — HYDROCODONE-ACETAMINOPHEN 5-325 MG PO TABS: 1.0000 | ORAL_TABLET | Freq: Four times a day (QID) | ORAL | Status: DC | PRN

## 2013-04-27 NOTE — ED Notes (Signed)
Pt back from radiology 

## 2013-04-27 NOTE — ED Notes (Addendum)
Pt discharged home, son at bedside. Has no further questions at the time. Pt refused cam walker, states she has one at home.

## 2013-04-27 NOTE — ED Notes (Signed)
Attempted to ambulate pt with ACE wrap - pt unable to place weight on rt ankle. Pt refuses to use CAM walker because she has one at home. Pts son has been called and voicemail left that he needs to bring in CAM walker.
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
## 2013-04-27 NOTE — ED Notes (Signed)
Pt taken to radiology

## 2013-04-27 NOTE — ED Notes (Signed)
Ortho tech paged at this time

## 2013-04-27 NOTE — ED Notes (Signed)
Pt able to ambulate using walker. Pt to be discharged home.

## 2013-04-27 NOTE — Progress Notes (Signed)
Orthopedic Tech Progress Note Patient Details:  [name] [birth year] [id number]  Ortho Devices Type of Ortho Device: CAM walker   [name], [name] 04/27/2013, 5:54 AM

## 2013-04-27 NOTE — ED Notes (Addendum)
Pt spoke with family on phone, on the way to hospital at the time.

## 2013-05-09 ENCOUNTER — Other Ambulatory Visit: Payer: Self-pay | Admitting: Internal Medicine

## 2013-05-09 DIAGNOSIS — N63 Unspecified lump in unspecified breast: Secondary | ICD-10-CM

## 2013-05-17 ENCOUNTER — Inpatient Hospital Stay: Admission: RE | Admit: 2013-05-17 | Payer: Medicare Other | Source: Ambulatory Visit

## 2015-09-07 LAB — BASIC METABOLIC PANEL
BUN: 16 mg/dL (ref 4–21)
Creatinine: 0.9 mg/dL (ref <=1.1)
Glucose: 120 mg/dL
Potassium: 4.3 mmol/L (ref 3.4–5.3)
Sodium: 137 mmol/L (ref 137–147)

## 2015-09-07 LAB — CBC AND DIFFERENTIAL
HCT: 39 % (ref 36–46)
Hemoglobin: 13 g/dL (ref 12.0–16.0)
Platelets: 422 10*3/uL — AB (ref 150–399)
WBC: 9.9 10*3/mL

## 2015-09-07 LAB — HEPATIC FUNCTION PANEL
ALT: 9 U/L (ref 7–35)
AST: 14 U/L (ref 13–35)
Alkaline Phosphatase: 67 U/L (ref 25–125)
BILIRUBIN, TOTAL: 0.5 mg/dL

## 2015-09-07 LAB — TSH: TSH: 0.27 u[IU]/mL — AB (ref <=5.90)

## 2015-09-07 LAB — HM DIABETES FOOT EXAM: HM Diabetic Foot Exam: NORMAL

## 2015-09-07 LAB — HEMOGLOBIN A1C: HEMOGLOBIN A1C: 5.8

## 2015-09-07 LAB — MICROALBUMIN / CREATININE URINE RATIO: Microalb Creat Ratio: 45.5

## 2015-09-16 ENCOUNTER — Encounter: Payer: Self-pay | Admitting: Family Medicine

## 2015-09-25 ENCOUNTER — Ambulatory Visit: Payer: Medicare Other | Admitting: Family Medicine

## 2015-10-08 ENCOUNTER — Ambulatory Visit (INDEPENDENT_AMBULATORY_CARE_PROVIDER_SITE_OTHER): Payer: Medicare Other | Admitting: Family Medicine

## 2015-10-08 ENCOUNTER — Encounter: Payer: Self-pay | Admitting: Family Medicine

## 2015-10-08 VITALS — BP 147/88 | HR 87 | Ht 62.75 in | Wt 151.2 lb

## 2015-10-08 DIAGNOSIS — Z91199 Patient's noncompliance with other medical treatment and regimen due to unspecified reason: Secondary | ICD-10-CM

## 2015-10-08 DIAGNOSIS — E782 Mixed hyperlipidemia: Secondary | ICD-10-CM | POA: Diagnosis not present

## 2015-10-08 DIAGNOSIS — E038 Other specified hypothyroidism: Secondary | ICD-10-CM | POA: Diagnosis not present

## 2015-10-08 DIAGNOSIS — G479 Sleep disorder, unspecified: Secondary | ICD-10-CM

## 2015-10-08 DIAGNOSIS — M159 Polyosteoarthritis, unspecified: Secondary | ICD-10-CM

## 2015-10-08 DIAGNOSIS — Z9119 Patient's noncompliance with other medical treatment and regimen: Secondary | ICD-10-CM

## 2015-10-08 DIAGNOSIS — K5730 Diverticulosis of large intestine without perforation or abscess without bleeding: Secondary | ICD-10-CM | POA: Insufficient documentation

## 2015-10-08 DIAGNOSIS — I10 Essential (primary) hypertension: Secondary | ICD-10-CM | POA: Diagnosis not present

## 2015-10-08 DIAGNOSIS — E663 Overweight: Secondary | ICD-10-CM

## 2015-10-08 DIAGNOSIS — F419 Anxiety disorder, unspecified: Secondary | ICD-10-CM | POA: Insufficient documentation

## 2015-10-08 DIAGNOSIS — F411 Generalized anxiety disorder: Secondary | ICD-10-CM | POA: Insufficient documentation

## 2015-10-08 MED ORDER — AMLODIPINE BESY-BENAZEPRIL HCL 5-20 MG PO CAPS: 1.0000 | ORAL_CAPSULE | Freq: Every day | ORAL | 0 refills | Status: DC

## 2015-10-08 MED ORDER — MELOXICAM 15 MG PO TABS: 15.0000 mg | ORAL_TABLET | Freq: Every day | ORAL | 0 refills | Status: DC

## 2015-10-08 MED ORDER — ESCITALOPRAM OXALATE 5 MG PO TABS: 5.0000 mg | ORAL_TABLET | Freq: Every day | ORAL | 0 refills | Status: DC

## 2015-10-08 NOTE — Patient Instructions (Signed)
Guidelines for a Low Sodium Diet   Low Sodium Diet A main source of sodium is table salt. The average American eats five or more teaspoons of salt each day. This is about 20 times as much as the body needs. In fact, your body needs only 1/4 teaspoon of salt every day. Sodium is found naturally in foods, but a lot of it is added during processing and preparation. Many foods that do not taste salty may still be high in sodium. Large amounts of sodium can be hidden in canned, processed and convenience foods. And sodium can be found in many foods that are served at fast food restaurants.  Sodium controls fluid balance in our bodies and maintains blood volume and blood pressure. Eating too much sodium may raise blood pressure and cause fluid retention, which could lead to swelling of the legs and feet or other health issues.  When limiting sodium in your diet, a common target is to eat less than 2,000 milligrams of sodium per day.   General Guidelines for Cutting Down on Salt Eliminate salty foods from your diet and reduce the amount of salt used in cooking. Sea salt is no better than regular salt.  Choose low sodium foods. Many salt-free or reduced salt products are available. When reading food labels, low sodium is defined as 140 mg of sodium per serving.  Salt substitutes are sometimes made from potassium, so read the label. If you are on a low potassium diet, then check with your doctor before using those salt substitutes.  Be creative and season your foods with spices, herbs, lemon, garlic, ginger, vinegar and pepper. Remove the salt shaker from the table.  Read ingredient labels to identify foods high in sodium. Items with 400 mg or more of sodium are high in sodium. High sodium food additives include salt, brine, or other items that say sodium, such as monosodium glutamate.  Eat more home-cooked meals. Foods cooked from scratch are naturally lower in sodium than most instant and boxed  mixes.  Don't use softened water for cooking and drinking since it contains added salt.  Avoid medications which contain sodium such as Alka Seltzer and Bromo Seltzer.  For more information; food composition books are available which tell how much sodium is in food. Online sources such as www.calorieking.com also list amounts.     Meats, Poultry, Fish, Legumes, Eggs and Nuts  High-Sodium Foods: Smoked, cured, salted or canned meat, fish or poultry including bacon, cold cuts, ham, frankfurters, sausage, sardines, caviar and anchovies Frozen breaded meats and dinners, such as burritos and pizza Canned entrees, such as ravioli, spam and chili Salted nuts Beans canned with salt added  Low-Sodium Alternatives: Any fresh or frozen beef, lamb, pork, poultry and fish Eggs and egg substitutes Low-sodium peanut butter Dry peas and beans (not canned) Low-sodium canned fish Drained, water or oil packed canned fish or poultry   Dairy Products  High-Sodium Foods: Buttermilk Regular and processed cheese, cheese spreads and sauces Cottage cheese  Low-Sodium Alternatives: Milk, yogurt, ice cream and ice milk Low-sodium cheeses, cream cheese, ricotta cheese and mozzarella   Breads, Grains and Cereals  High-Sodium Foods: Bread and rolls with salted tops Quick breads, self-rising flour, biscuit, pancake and waffle mixes Pizza, croutons and salted crackers Prepackaged, processed mixes for potatoes, rice, pasta and stuffing  Low-Sodium Alternatives: Breads, bagels and rolls without salted tops Muffins and most ready-to-eat cereals All rice and pasta, but do not to add salt when cooking Low-sodium corn and   flour tortillas and noodles Low-sodium crackers and breadsticks Unsalted popcorn, chips and pretzels      Vegetables and Fruits  High-Sodium Foods: Regular canned vegetables and vegetable juices Olives, pickles, sauerkraut and other pickled vegetables Vegetables made with  ham, bacon or salted pork Packaged mixes, such as scalloped or au gratin potatoes, frozen hash browns and Tater Tots Commercially prepared pasta and tomato sauces and salsa  Low-Sodium Alternatives: Fresh and frozen vegetables without sauces Low-sodium canned vegetables, sauces and juices Fresh potatoes, frozen JamaicaFrench fries and instant mashed potatoes Low-salt tomato or V-8 juice. Most fresh, frozen and canned fruit Dried fruits   Soups  High-Sodium Foods: Regular canned and dehydrated soup, broth and bouillon Cup of noodles and seasoned ramen mixes  Low-Sodium Alternatives: Low-sodium canned and dehydrated soups, broth and bouillon Homemade soups without added salt   Fats, Desserts and Sweets  High-Sodium Foods: Soy sauce, seasoning salt, other sauces and marinades Bottled salad dressings, regular salad dressing with bacon bits Salted butter or margarine Instant pudding and cake Large portions of ketchup, mustard  Low-Sodium Alternatives: Vinegar, unsalted butter or margarine Vegetable oils and low sodium sauces and salad dressings Mayonnaise All desserts made without salt    Hypertension Hypertension, commonly called high blood pressure, is when the force of blood pumping through your arteries is too strong. Your arteries are the blood vessels that carry blood from your heart throughout your body. A blood pressure reading consists of a higher number over a lower number, such as 110/72. The higher number (systolic) is the pressure inside your arteries when your heart pumps. The lower number (diastolic) is the pressure inside your arteries when your heart relaxes. Ideally you want your blood pressure below 120/80. Hypertension forces your heart to work harder to pump blood. Your arteries may become narrow or stiff. Having untreated or uncontrolled hypertension can cause heart attack, stroke, kidney disease, and other problems. RISK FACTORS Some risk factors for high blood  pressure are controllable. Others are not.  Risk factors you cannot control include:   Race. You may be at higher risk if you are African American.  Age. Risk increases with age.  Gender. Men are at higher risk than women before age 80 years. After age 80, women are at higher risk than men. Risk factors you can control include:  Not getting enough exercise or physical activity.  Being overweight.  Getting too much fat, sugar, calories, or salt in your diet.  Drinking too much alcohol. SIGNS AND SYMPTOMS Hypertension does not usually cause signs or symptoms. Extremely high blood pressure (hypertensive crisis) may cause headache, anxiety, shortness of breath, and nosebleed. DIAGNOSIS To check if you have hypertension, your health care provider will measure your blood pressure while you are seated, with your arm held at the level of your heart. It should be measured at least twice using the same arm. Certain conditions can cause a difference in blood pressure between your right and left arms. A blood pressure reading that is higher than normal on one occasion does not mean that you need treatment. If it is not clear whether you have high blood pressure, you may be asked to return on a different day to have your blood pressure checked again. Or, you may be asked to monitor your blood pressure at home for 1 or more weeks. TREATMENT Treating high blood pressure includes making lifestyle changes and possibly taking medicine. Living a healthy lifestyle can help lower high blood pressure. You may need to change  some of your habits. Lifestyle changes may include:  Following the DASH diet. This diet is high in fruits, vegetables, and whole grains. It is low in salt, red meat, and added sugars.  Keep your sodium intake below 2,300 mg per day.  Getting at least 30-45 minutes of aerobic exercise at least 4 times per week.  Losing weight if necessary.  Not smoking.  Limiting alcoholic  beverages.  Learning ways to reduce stress. Your health care provider may prescribe medicine if lifestyle changes are not enough to get your blood pressure under control, and if one of the following is true:  You are 69-11 years of age and your systolic blood pressure is above 140.  You are 22 years of age or older, and your systolic blood pressure is above 150.  Your diastolic blood pressure is above 90.  You have diabetes, and your systolic blood pressure is over 140 or your diastolic blood pressure is over 90.  You have kidney disease and your blood pressure is above 140/90.  You have heart disease and your blood pressure is above 140/90. Your personal target blood pressure may vary depending on your medical conditions, your age, and other factors. HOME CARE INSTRUCTIONS  Have your blood pressure rechecked as directed by your health care provider.   Take medicines only as directed by your health care provider. Follow the directions carefully. Blood pressure medicines must be taken as prescribed. The medicine does not work as well when you skip doses. Skipping doses also puts you at risk for problems.  Do not smoke.   Monitor your blood pressure at home as directed by your health care provider. SEEK MEDICAL CARE IF:   You think you are having a reaction to medicines taken.  You have recurrent headaches or feel dizzy.  You have swelling in your ankles.  You have trouble with your vision. SEEK IMMEDIATE MEDICAL CARE IF:  You develop a severe headache or confusion.  You have unusual weakness, numbness, or feel faint.  You have severe chest or abdominal pain.  You vomit repeatedly.  You have trouble breathing. MAKE SURE YOU:   Understand these instructions.  Will watch your condition.  Will get help right away if you are not doing well or get worse.   This information is not intended to replace advice given to you by your health care provider. Make sure you  discuss any questions you have with your health care provider.   Document Released: 01/10/2005 Document Revised: 05/27/2014 Document Reviewed: 11/02/2012 Elsevier Interactive Patient Education [employer]

## 2015-10-08 NOTE — Progress Notes (Addendum)
New patient office visit note:  Impression and Recommendations:    1. History of noncompliance with medical treatment   2. Essential hypertension   3. Mixed hyperlipidemia   4. Other specified hypothyroidism   5. GAD (generalized anxiety disorder)   6. Sleeping difficulty   7. Generalized OA   8. Overweight (BMI 25.0-29.9)    - Issues with medical noncompliance discussed with patient.   - She is not taking any of her medications at all. She was lost to follow-up and does not recall when her last blood work was.  - Explained to patient that she will have to come in and see me on a regular basis- every 4 months possibly- and if she is looking for a doctor to be less hands-on, I recommend she go elsewhere. She declined.    Hypertension Noncompliant with medications. Has not been taking them.  Total patient to just start taking a half of her blood pressure pill for one week and if blood pressure is still elevated and she feels okay, go to 1 tab daily.  BP at home- checks it occasionally - "it runs ok sometimes."  Doesn't know number and I doubt she is checking it.    No prudent diet. Does exercise by walking daily.   Hyperlipidemia:.  - We'll check what fasting blood work is in near future. On no medications  Hypothyroidism: - Patient denies extreme fatigue but we will check TSH and free T4 in the near future  Anxiety:  - Explained to patient I do not treat anxiety with diazepam especially in somebody who is 82 years old. Explained if this is what she feels she absolutely needs, she will have to get it from another doctor, however I told her there are much better options and several others that we can try.  - She agrees to do a trial of Lexapro which was started today.  Follow-up 4-6 weeks   Sleep difficulties: -Advise melatonin 3-9 mg nightly half hour before bedtime.  - Importance of sleep hygiene discussed  Generalized OA:  -Mobic refilled. She understands  risks.   Overweight:  - Importance of prudent diet and regular moderate intensity aerobic activity discussed    Orders Placed This Encounter  Procedures  . CBC with Differential/Platelet  . COMPLETE METABOLIC PANEL WITH GFR  . Hemoglobin A1c  . Hepatitis C antibody  . Lipid panel  . T4, free  . TSH  . Vitamin B12  . VITAMIN D 25 Hydroxy (Vit-D Deficiency, Fractures)      New Prescriptions   ESCITALOPRAM (LEXAPRO) 5 MG TABLET    Take 1 tablet (5 mg total) by mouth at bedtime.    Modified Medications   Modified Medication Previous Medication   AMLODIPINE-BENAZEPRIL (LOTREL) 5-20 MG CAPSULE amLODipine-benazepril (LOTREL) 5-20 MG per capsule      Take 1 capsule by mouth daily.    Take 1 capsule by mouth 2 (two) times daily.   MELOXICAM (MOBIC) 15 MG TABLET meloxicam (MOBIC) 15 MG tablet      Take 1 tablet (15 mg total) by mouth daily.    Take 15 mg by mouth daily.    Discontinued Medications   No medications on file     The patient was counseled, risk factors were discussed, anticipatory guidance given.  Gross side effects, risk and benefits, and alternatives of medications discussed with patient.  Patient is aware that all medications have potential side effects and we are unable  to predict every side effect or drug-drug interaction that may occur.  Expresses verbal understanding and consents to current therapy plan and treatment regimen.  Return in about 4 weeks (around 11/05/2015) for Fasting blood work; follow-up start new BP med, lexapro and mobic.  Please see AVS handed out to patient at the end of our visit for further patient instructions/ counseling done pertaining to today's office visit.    Note: This document was prepared using Dragon voice recognition software and may include unintentional dictation errors.  ----------------------------------------------------------------------------------------------------------------------    Subjective:    Chief  Complaint  Patient presents with  . Establish Care    HPI: [name] is a pleasant 82 y.o. female who [facility] to review their medical history with me and establish care.    Pcp- was Dr Cathi RoanAlba- for many yrs but he had too many Obama pt's- she had to leave- she tells me she was also d/ced from practice for too many no-shows.    Widowed- 15 yrs ago. Lives with her dog- BoJo. Likes to walk him- an hour TID, goes to pool.    1) nervous- causing sleep difficulties- All my life.  Pt has never been on a medicine for anxiety in past.  Never tried sleep med but was told to go for sleep study but she did not.  Can't fall alseep nor stay asleep    2) BP- last time she took BP meds was back in April 3rd 2015 when she went to her PCP then Dr Felipa EthAvva.  Feels like her BP is up due to nerves and not sleeping. Patient denies headache any more than her usual. No visual changes. Has chronic chest pain which has been evaluated in the past and has not changed. No shortness of breath, no orthopnea or swelling in her feet., No heart palpitations unless she gets really nervous.    PHQ-2 negative  Wt Readings from Last 3 Encounters:  10/08/15 151 lb 3.2 oz (68.6 kg)  04/26/13 142 lb (64.4 kg)   BP Readings from Last 3 Encounters:  10/08/15 (!) 147/88  04/27/13 127/81  03/12/12 139/85   Pulse Readings from Last 3 Encounters:  10/08/15 87  04/27/13 85  03/12/12 86   BMI Readings from Last 3 Encounters:  10/08/15 27.00 kg/m  04/26/13 23.63 kg/m      Patient Active Problem List   Diagnosis Date Noted  . Hypothyroidism 10/10/2015  . Adjustment disorder with mixed anxiety and depressed mood 10/10/2015  . Sleeping difficulty 10/10/2015  . Mixed hyperlipidemia 10/10/2015  . Chronic Chest pain- unknwn etiology- noncardiogenic 10/10/2015  . Generalized OA 10/10/2015  . Overweight (BMI 25.0-29.9) 10/10/2015  . History of noncompliance with medical  treatment 10/10/2015  . Hypertension 10/08/2015  . Diverticulosis of colon (without mention of hemorrhage) 10/08/2015  . GAD (generalized anxiety disorder) 10/08/2015     Past Medical History:  Diagnosis Date  . Anxiety   . Anxiety and depression   . Depression   . Diverticulosis of colon (without mention of hemorrhage)   . Hiatal hernia   . Hyperlipidemia   . Hypertension   . Non compliance w medication regimen   . Other and unspecified noninfectious gastroenteritis and colitis(558.9)   . Pneumonia      Past Medical History:  Diagnosis Date  . Anxiety   . Anxiety and depression   . Depression   . Diverticulosis of colon (without mention of hemorrhage)   .  Hiatal hernia   . Hyperlipidemia   . Hypertension   . Non compliance w medication regimen   . Other and unspecified noninfectious gastroenteritis and colitis(558.9)   . Pneumonia      Past Surgical History:  Procedure Laterality Date  . ABDOMINAL HYSTERECTOMY    . ABDOMINAL HYSTERECTOMY    . BACK SURGERY    . INCONTINENCE SURGERY    . LUMBAR SPINE SURGERY  2009  . NECK SURGERY    . SHOULDER ARTHROSCOPY  2007   left  . SPINAL FIXATION SURGERY     Multiple  . TOTAL KNEE ARTHROPLASTY  2007   left  . VENTRAL HERNIA REPAIR  2003     Family History  Problem Relation Age of Onset  . Coronary artery disease Father 45  . Diabetes Father   . Coronary artery disease Mother   . Diabetes Mother   . Diabetes Sister   . Diabetes Sister      History  Drug Use No    History  Alcohol Use No    History  Smoking Status  . Never Smoker  Smokeless Tobacco  . Never Used     Patient's Medications  New Prescriptions   ESCITALOPRAM (LEXAPRO) 5 MG TABLET    Take 1 tablet (5 mg total) by mouth at bedtime.  Previous Medications   AMOXICILLIN (AMOXIL) 875 MG TABLET    Take 1 tablet by mouth 2 (two) times daily.   ATORVASTATIN (LIPITOR) 20 MG TABLET    Take 20 mg by mouth daily.   CYCLOSPORINE (RESTASIS OP)     Place 1 drop into both eyes 2 (two) times daily.    DIAZEPAM (VALIUM) 5 MG TABLET    Take 5 mg by mouth 2 (two) times daily.   HYDROCODONE-ACETAMINOPHEN (NORCO/VICODIN) 5-325 MG PER TABLET    Take 1 tablet by mouth every 6 (six) hours as needed for moderate pain.   HYDROCODONE-ACETAMINOPHEN (NORCO/VICODIN) 5-325 MG PER TABLET    Take 1 tablet by mouth every 6 (six) hours as needed.   LEVOTHYROXINE (SYNTHROID, LEVOTHROID) 50 MCG TABLET    Take 50 mcg by mouth daily before breakfast.  Modified Medications   Modified Medication Previous Medication   AMLODIPINE-BENAZEPRIL (LOTREL) 5-20 MG CAPSULE amLODipine-benazepril (LOTREL) 5-20 MG per capsule      Take 1 capsule by mouth daily.    Take 1 capsule by mouth 2 (two) times daily.   MELOXICAM (MOBIC) 15 MG TABLET meloxicam (MOBIC) 15 MG tablet      Take 1 tablet (15 mg total) by mouth daily.    Take 15 mg by mouth daily.  Discontinued Medications   No medications on file    Allergies: Review of patient's allergies indicates no known allergies.  Review of Systems  Constitutional: Negative.   HENT: Negative.   Eyes: Negative.   Respiratory: Negative.   Cardiovascular: Positive for chest pain.       Chronic problem x 6 months  Gastrointestinal: Negative.   Genitourinary: Negative.   Musculoskeletal: Positive for joint pain and myalgias.  Skin: Negative.   Neurological: Negative.   Endo/Heme/Allergies: Negative.   Psychiatric/Behavioral: The patient has insomnia.      Objective:    Blood pressure (!) 147/88, pulse 87, height 5' 2.75" (1.594 m), weight 151 lb 3.2 oz (68.6 kg). Body mass index is 27 kg/m. General: Well Developed, well nourished, and in no acute distress.  Neuro: Alert and oriented x3, extra-ocular muscles intact, sensation grossly intact.  HEENT: Normocephalic,  atraumatic, pupils equal round reactive to light, neck supple, no gross masses, no carotid bruits, no JVD apprec Skin: no gross suspicious lesions or rashes    Cardiac: Regular rate and rhythm, no murmurs rubs or gallops.  Respiratory: Essentially clear to auscultation bilaterally. Not using accessory muscles, speaking in full sentences.  Abdominal: Soft, not grossly distended Musculoskeletal: Ambulates w/o diff, FROM * 4 ext.  Vasc: less 2 sec cap RF, warm and pink  Psych:  No HI/SI, judgement and insight good, Euthymic mood. Full Affect.

## 2015-10-08 NOTE — Assessment & Plan Note (Addendum)
Noncompliant with medications. Has not been taking them.  Total patient to just start taking a half of her blood pressure pill for one week and if blood pressure is still elevated and she feels okay, go to 1 tab daily.  BP at home- checks it occasionally - "it runs ok sometimes."  Doesn't know number and I doubt she is checking it.    No prudent diet. Does exercise by walking daily.

## 2015-10-10 DIAGNOSIS — Z91199 Patient's noncompliance with other medical treatment and regimen due to unspecified reason: Secondary | ICD-10-CM | POA: Insufficient documentation

## 2015-10-10 DIAGNOSIS — R0789 Other chest pain: Secondary | ICD-10-CM

## 2015-10-10 DIAGNOSIS — Z9119 Patient's noncompliance with other medical treatment and regimen: Secondary | ICD-10-CM | POA: Insufficient documentation

## 2015-10-10 DIAGNOSIS — E663 Overweight: Secondary | ICD-10-CM | POA: Insufficient documentation

## 2015-10-10 DIAGNOSIS — F4323 Adjustment disorder with mixed anxiety and depressed mood: Secondary | ICD-10-CM | POA: Insufficient documentation

## 2015-10-10 DIAGNOSIS — G479 Sleep disorder, unspecified: Secondary | ICD-10-CM | POA: Insufficient documentation

## 2015-10-10 DIAGNOSIS — E039 Hypothyroidism, unspecified: Secondary | ICD-10-CM | POA: Insufficient documentation

## 2015-10-10 DIAGNOSIS — E782 Mixed hyperlipidemia: Secondary | ICD-10-CM | POA: Insufficient documentation

## 2015-10-10 DIAGNOSIS — M159 Polyosteoarthritis, unspecified: Secondary | ICD-10-CM | POA: Insufficient documentation

## 2015-10-10 DIAGNOSIS — R079 Chest pain, unspecified: Secondary | ICD-10-CM | POA: Insufficient documentation

## 2015-11-13 ENCOUNTER — Ambulatory Visit (INDEPENDENT_AMBULATORY_CARE_PROVIDER_SITE_OTHER): Payer: Medicare Other | Admitting: Family Medicine

## 2015-11-13 ENCOUNTER — Encounter: Payer: Self-pay | Admitting: Family Medicine

## 2015-11-13 VITALS — BP 95/63 | HR 89 | Ht 62.75 in | Wt 150.5 lb

## 2015-11-13 DIAGNOSIS — F411 Generalized anxiety disorder: Secondary | ICD-10-CM | POA: Diagnosis not present

## 2015-11-13 DIAGNOSIS — M159 Polyosteoarthritis, unspecified: Secondary | ICD-10-CM

## 2015-11-13 DIAGNOSIS — E663 Overweight: Secondary | ICD-10-CM

## 2015-11-13 DIAGNOSIS — Z9119 Patient's noncompliance with other medical treatment and regimen: Secondary | ICD-10-CM

## 2015-11-13 DIAGNOSIS — F4323 Adjustment disorder with mixed anxiety and depressed mood: Secondary | ICD-10-CM | POA: Diagnosis not present

## 2015-11-13 DIAGNOSIS — Z91199 Patient's noncompliance with other medical treatment and regimen due to unspecified reason: Secondary | ICD-10-CM

## 2015-11-13 DIAGNOSIS — E038 Other specified hypothyroidism: Secondary | ICD-10-CM | POA: Diagnosis not present

## 2015-11-13 DIAGNOSIS — I10 Essential (primary) hypertension: Secondary | ICD-10-CM | POA: Diagnosis not present

## 2015-11-13 DIAGNOSIS — G479 Sleep disorder, unspecified: Secondary | ICD-10-CM

## 2015-11-13 DIAGNOSIS — E782 Mixed hyperlipidemia: Secondary | ICD-10-CM

## 2015-11-13 MED ORDER — AMLODIPINE BESYLATE 5 MG PO TABS: 5.0000 mg | ORAL_TABLET | Freq: Every day | ORAL | 0 refills | Status: DC

## 2015-11-13 NOTE — Progress Notes (Deleted)
Impression and Recommendations:    No diagnosis found.   HTN: ***   Education and routine counseling performed. Handouts provided.   New Prescriptions   No medications on file    Modified Medications   No medications on file    Discontinued Medications   AMOXICILLIN (AMOXIL) 875 MG TABLET    Take 1 tablet by mouth 2 (two) times daily.   DIAZEPAM (VALIUM) 5 MG TABLET    Take 5 mg by mouth 2 (two) times daily.   ESCITALOPRAM (LEXAPRO) 5 MG TABLET    Take 1 tablet (5 mg total) by mouth at bedtime.   HYDROCODONE-ACETAMINOPHEN (NORCO/VICODIN) 5-325 MG PER TABLET    Take 1 tablet by mouth every 6 (six) hours as needed for moderate pain.   HYDROCODONE-ACETAMINOPHEN (NORCO/VICODIN) 5-325 MG PER TABLET    Take 1 tablet by mouth every 6 (six) hours as needed.   MELOXICAM (MOBIC) 15 MG TABLET    Take 1 tablet (15 mg total) by mouth daily.     No Follow-up on file.  The patient was counseled, risk factors were discussed, anticipatory guidance given.  Gross side effects, risk and benefits, and alternatives of medications discussed with patient.  Patient is aware that all medications have potential side effects and we are unable to predict every side effect or drug-drug interaction that may occur.  Expresses verbal understanding and consents to current therapy plan and treatment regimen.  Please see AVS handed out to patient at the end of our visit for further patient instructions/ counseling done pertaining to today's office visit.    Note: This document was prepared using Dragon voice recognition software and may include unintentional dictation errors.     Subjective:    Chief Complaint  Patient presents with  . Hypertension  . Depression  . Osteoarthritis    HPI: [name] is a 82 y.o. female who presents to [facility] today for follow up for HTN.     HTN: Home BP readings have been running in the ***.   Pt has been tolerating meds  well.  Taking as prescribed.  Denies HA, dizziness, CP, SOB, Visual changes, increasing pedal edema.    Preventitive Healthcare:  Exercise: {YES/NO/WILD ZOXWR:60454}   Diet Pattern: ***  Salt Restriction: ***    Patient Care Team    Relationship Specialty Notifications Start End  Thomasene Lot, DO PCP - General Family Medicine  09/03/15   Eldred Manges, MD Consulting Physician Orthopedic Surgery  10/08/15   Chilton Greathouse, MD Consulting Physician Internal Medicine  10/08/15    Comment: was PCP prior.   Kathryne Hitch, MD Consulting Physician Orthopedic Surgery  10/08/15      Wt Readings from Last 3 Encounters:  10/08/15 151 lb 3.2 oz (68.6 kg)  04/26/13 142 lb (64.4 kg)    BP Readings from Last 3 Encounters:  10/08/15 (!) 147/88  04/27/13 127/81  03/12/12 139/85    Pulse Readings from Last 3 Encounters:  10/08/15 87  04/27/13 85  03/12/12 86    BMI Readings from Last 3 Encounters:  10/08/15 27.00 kg/m  04/26/13 23.63 kg/m     Lab Results  Component Value Date   CREATININE 0.9 09/07/2015   BUN 16 09/07/2015   NA 137 09/07/2015   K 4.3 09/07/2015   CL 104 04/26/2013   CO2 22 04/26/2013    No results found for: CHOL  No results found for: HDL  No results found for: LDLCALC  No results found for: TRIG  No results found for: CHOLHDL  No results found for: LDLDIRECT ===================================================================  Patient Active Problem List   Diagnosis Date Noted  . Hypothyroidism 10/10/2015  . Adjustment disorder with mixed anxiety and depressed mood 10/10/2015  . Sleeping difficulty 10/10/2015  . Mixed hyperlipidemia 10/10/2015  . Chronic Chest pain- unknwn etiology- noncardiogenic 10/10/2015  . Generalized OA 10/10/2015  . Overweight (BMI 25.0-29.9) 10/10/2015  . History of noncompliance with medical treatment 10/10/2015  . Hypertension 10/08/2015  . Diverticulosis of colon (without mention of hemorrhage)  10/08/2015  . GAD (generalized anxiety disorder) 10/08/2015    Past Medical History:  Diagnosis Date  . Anxiety   . Anxiety and depression   . Depression   . Diverticulosis of colon (without mention of hemorrhage)   . Hiatal hernia   . Hyperlipidemia   . Hypertension   . Non compliance w medication regimen   . Other and unspecified noninfectious gastroenteritis and colitis(558.9)   . Pneumonia     Past Surgical History:  Procedure Laterality Date  . ABDOMINAL HYSTERECTOMY    . ABDOMINAL HYSTERECTOMY    . BACK SURGERY    . INCONTINENCE SURGERY    . LUMBAR SPINE SURGERY  2009  . NECK SURGERY    . SHOULDER ARTHROSCOPY  2007   left  . SPINAL FIXATION SURGERY     Multiple  . TOTAL KNEE ARTHROPLASTY  2007   left  . VENTRAL HERNIA REPAIR  2003    Family History  Problem Relation Age of Onset  . Coronary artery disease Father 38  . Diabetes Father   . Coronary artery disease Mother   . Diabetes Mother   . Diabetes Sister   . Diabetes Sister     History  Drug Use No  ,  History  Alcohol Use No  ,  History  Smoking Status  . Never Smoker  Smokeless Tobacco  . Never Used  ,    Current Outpatient Prescriptions on File Prior to Visit  Medication Sig Dispense Refill  . amLODipine-benazepril (LOTREL) 5-20 MG capsule Take 1 capsule by mouth daily. 90 capsule 0  . atorvastatin (LIPITOR) 20 MG tablet Take 20 mg by mouth daily.    . CycloSPORINE (RESTASIS OP) Place 1 drop into both eyes 2 (two) times daily.     Marland Kitchen levothyroxine (SYNTHROID, LEVOTHROID) 50 MCG tablet Take 50 mcg by mouth daily before breakfast.     No current facility-administered medications on file prior to visit.     Allergies  Allergen Reactions  . Escitalopram Diarrhea  . Meloxicam Other (See Comments)    insomnia     Review of Systems  Constitutional: Positive for fever. Negative for chills, diaphoresis, malaise/fatigue and weight loss.  HENT: Positive for congestion and sore throat.  Negative for tinnitus.   Eyes: Negative.  Negative for blurred vision, double vision and photophobia.  Respiratory: Positive for cough and wheezing.   Cardiovascular: Negative.  Negative for chest pain and palpitations.  Gastrointestinal: Positive for diarrhea. Negative for blood in stool, nausea and vomiting.  Genitourinary: Negative.  Negative for dysuria, frequency and urgency.  Musculoskeletal: Positive for joint pain. Negative for myalgias.       Right shoulder  Skin: Negative.  Negative for itching and rash.  Neurological: Positive for headaches. Negative for dizziness, focal weakness and weakness.  Endo/Heme/Allergies: Positive for environmental allergies. Negative for polydipsia. Does not bruise/bleed easily.  Psychiatric/Behavioral: Negative.  Negative  for depression and memory loss. The patient is not nervous/anxious and does not have insomnia.    Review of Systems:  General:  Denies fever, chills, appetite changes, unexplained weight loss.  Respiratory: Denies SOB, DOE, cough, wheezing.  Cardiovascular: Denies chest pain, palpitations.  Gastrointestinal: Denies nausea, vomiting, diarrhea, abdominal pain.  Genitourinary: Denies dysuria, increased frequency, flank pain. Endocrine: Denies hot or cold intolerance, polyuria, polydipsia. Musculoskeletal: Denies myalgias, back pain, joint swelling, arthralgias, gait problems.  Skin: Denies pallor, rash, suspicious lesions.  Neurological: Denies dizziness, seizures, syncope, unexplained weakness, lightheadedness, numbness and headaches.  Psychiatric/Behavioral: Denies mood changes, suicidal or homicidal ideations, hallucinations, sleep disturbances.    Objective:    There were no vitals taken for this visit.  There is no height or weight on file to calculate BMI.  General: Well Developed, well nourished, and in no acute distress.  HEENT: Normocephalic, atraumatic, pupils equal round reactive to light, neck supple, No carotid  bruits, no JVD Skin: Warm and dry, cap RF less 2 sec Cardiac: Regular rate and rhythm, S1, S2 WNL's, no murmurs rubs or gallops Respiratory: ECTA B/L, Not using accessory muscles, speaking in full sentences. NeuroM-Sk: Ambulates w/o assistance, moves ext * 4 w/o difficulty, sensation grossly intact.  Ext: *** edema b/l lower ext Psych: No HI/SI, judgement and insight good, Euthymic mood. Full Affect.

## 2015-11-13 NOTE — Progress Notes (Signed)
Impression and Recommendations:    1. Other specified hypothyroidism   2. Essential hypertension   3. GAD (generalized anxiety disorder)   4. Adjustment disorder with mixed anxiety and depressed mood   5. Sleeping difficulty   6. Mixed hyperlipidemia   7. Generalized OA   8. History of noncompliance with medical treatment   9. Overweight (BMI 25.0-29.9)     GAD (generalized anxiety disorder) DC Lexapro  Advise going to a counselor\psychologist for CBT.  Importance of Diet and lifestyle modifications including regular exercise stressed   Hypertension Will switch from combination pill down to just the one.  Monitor blood pressure at home  Call me immediately with any questions or if you develop any symptoms  Generalized OA D/C mobic due to intolerance  Proper hydration and exercise  Prudent diet with anti-inflammatories   Overweight (BMI 25.0-29.9) Explained to patient what BMI refers to, and what it means medically.    Told patient to think about it as a "medical risk stratification measurement" and how increasing BMI is associated with increasing risk/ or worsening state of various diseases such as hypertension, hyperlipidemia, diabetes, premature OA, depression etc.  American Heart Association guidelines for healthy diet, basically Mediterranean diet, and exercise guidelines of 30 minutes 5 days per week or more discussed in detail.  Health counseling performed.  All questions answered.   History of noncompliance with medical treatment Importance of compliance stressed to patient.   New Prescriptions   AMLODIPINE (NORVASC) 5 MG TABLET    Take 1 tablet (5 mg total) by mouth daily.    Modified Medications   No medications on file    Discontinued Medications   AMLODIPINE-BENAZEPRIL (LOTREL) 5-20 MG CAPSULE    Take 1 capsule by mouth daily.   AMOXICILLIN (AMOXIL) 875 MG TABLET    Take 1 tablet by mouth 2 (two) times daily.   DIAZEPAM (VALIUM) 5 MG  TABLET    Take 5 mg by mouth 2 (two) times daily.   ESCITALOPRAM (LEXAPRO) 5 MG TABLET    Take 1 tablet (5 mg total) by mouth at bedtime.   HYDROCODONE-ACETAMINOPHEN (NORCO/VICODIN) 5-325 MG PER TABLET    Take 1 tablet by mouth every 6 (six) hours as needed for moderate pain.   HYDROCODONE-ACETAMINOPHEN (NORCO/VICODIN) 5-325 MG PER TABLET    Take 1 tablet by mouth every 6 (six) hours as needed.   MELOXICAM (MOBIC) 15 MG TABLET    Take 1 tablet (15 mg total) by mouth daily.    The patient was counseled, risk factors were discussed, anticipatory guidance given.  Gross side effects, risk and benefits, and alternatives of medications and treatment plan in general discussed with patient.  Patient is aware that all medications have potential side effects and we are unable to predict every side effect or drug-drug interaction that may occur.   Patient will call with any questions prior to using medication if they have concerns.  Expresses verbal understanding and consents to current therapy and treatment regimen.  No barriers to understanding were identified.  Red flag symptoms and signs discussed in detail.  Patient expressed understanding regarding what to do in case of emergency\urgent symptoms  Return in about 4 weeks (around 12/11/2015) for recheck blood pressure.  Please see AVS handed out to patient at the end of our visit for further patient instructions/ counseling done pertaining to today's office visit.    Note: This document was prepared using Dragon voice recognition software and may include unintentional  dictation errors.   --------------------------------------------------------------------------------------------------------------------------------------------------------------------------------------------------------------------------------------------    Subjective:    CC:  Chief Complaint  Patient presents with  . Hypertension  . Depression  . Osteoarthritis    HPI: [name] is a 82 y.o. female who presents to [facility] at [facility] today for issues as discussed below.   Anxiety:  lexapro-  "did not tolerate well"- mouth dry, couldn't sleep, read s-e and doesn';t want to take them.   HTN:   No dizziness, lightheadness etc. Taking BP med daily.  No prudent diet or exercise.  Arthritis:  Mobic- "I just can't take it"- dry mouth, can't sleep.  She takes an OTC arthritis pill - ? Name, can;t tolerate anything else.   Lives alone- no falls. Originally from [place],  Son passed- July 4th- 2016,  Husband passed 2003.  Daughter is here with her in town- [name] Readings from Last 3 Encounters:  11/13/15 150 lb 8 oz (68.3 kg)  10/08/15 151 lb 3.2 oz (68.6 kg)  04/26/13 142 lb (64.4 kg)   BP Readings from Last 3 Encounters:  11/13/15 95/63  10/08/15 (!) 147/88  04/27/13 127/81   Pulse Readings from Last 3 Encounters:  11/13/15 89  10/08/15 87  04/27/13 85   BMI Readings from Last 3 Encounters:  11/13/15 26.87 kg/m  10/08/15 27.00 kg/m  04/26/13 23.63 kg/m     Patient Care Team    Relationship Specialty Notifications Start End  Thomasene Lot, DO PCP - General Family Medicine  09/03/15   Eldred Manges, MD Consulting Physician Orthopedic Surgery  10/08/15   Chilton Greathouse, MD Consulting Physician Internal Medicine  10/08/15    Comment: was PCP prior.   Kathryne Hitch, MD Consulting Physician Orthopedic Surgery  10/08/15      Patient Active Problem List   Diagnosis Date Noted  . Hypothyroidism 10/10/2015  . Adjustment disorder with mixed anxiety and depressed mood 10/10/2015  . Sleeping difficulty 10/10/2015  . Mixed hyperlipidemia 10/10/2015  . Chronic Chest pain- unknwn etiology- noncardiogenic 10/10/2015  . Generalized OA 10/10/2015  . Overweight (BMI 25.0-29.9) 10/10/2015  . History of noncompliance with medical treatment 10/10/2015  . Hypertension 10/08/2015  . Diverticulosis of colon  (without mention of hemorrhage) 10/08/2015  . GAD (generalized anxiety disorder) 10/08/2015    Past Medical history, Surgical history, Family history, Social history, Allergies and Medications have been entered into the medical record, reviewed and changed as needed.   Allergies:  Allergies  Allergen Reactions  . Escitalopram Diarrhea  . Meloxicam Other (See Comments)    insomnia    Review of Systems  Constitutional: Negative.  Negative for chills, diaphoresis, fever and weight loss.  HENT: Negative for tinnitus.   Eyes: Negative for blurred vision, double vision and photophobia.  Respiratory: Positive for wheezing. Negative for cough and shortness of breath.   Cardiovascular: Negative for chest pain, palpitations, orthopnea, claudication, leg swelling and PND.  Gastrointestinal: Negative for diarrhea, nausea and vomiting.  Genitourinary: Negative for frequency and urgency.  Musculoskeletal: Negative for joint pain and myalgias.  Skin: Negative for itching and rash.  Neurological: Negative for dizziness and headaches.  Endo/Heme/Allergies: Negative for polydipsia.  Psychiatric/Behavioral: Negative for depression, memory loss and suicidal ideas. The patient does not have insomnia.      Objective:   Blood pressure 95/63, pulse 89, height 5' 2.75" (1.594 m), weight 150 lb 8 oz (68.3 kg). Body mass index is 26.87 kg/m. General:  Well Developed, well nourished, appropriate for stated age.  Neuro: Alert and oriented x3, extra-ocular muscles intact, sensation grossly intact.  HEENT: Normocephalic, atraumatic, neck supple   Skin: Warm and dry, no gross rash. Cardiac: Distant but RRR, S1 S2,  no murmurs rubs or gallops.  Respiratory: Coarse breath sounds throughout; not using accessory muscles, speaking in full sentences-unlabored. Vascular:  No gross lower ext edema, cap RF less 2 sec. Psych: No HI/SI, judgement and insight good, Euthymic mood. Full Affect.

## 2015-11-13 NOTE — Patient Instructions (Addendum)
Delsym prn cough    Mediterranean Diet  Why follow it? Research shows. . Those who follow the Mediterranean diet have a reduced risk of heart disease  . The diet is associated with a reduced incidence of Parkinson's and Alzheimer's diseases . People following the diet may have longer life expectancies and lower rates of chronic diseases  . The Dietary Guidelines for Americans recommends the Mediterranean diet as an eating plan to promote health and prevent disease  What Is the Mediterranean Diet?  . Healthy eating plan based on typical foods and recipes of Mediterranean-style cooking . The diet is primarily a plant based diet; these foods should make up a majority of meals   Starches - Plant based foods should make up a majority of meals - They are an important sources of vitamins, minerals, energy, antioxidants, and fiber - Choose whole grains, foods high in fiber and minimally processed items  - Typical grain sources include wheat, oats, barley, corn, brown rice, bulgar, farro, millet, polenta, couscous  - Various types of beans include chickpeas, lentils, fava beans, black beans, white beans   Fruits  Veggies - Large quantities of antioxidant rich fruits & veggies; 6 or more servings  - Vegetables can be eaten raw or lightly drizzled with oil and cooked  - Vegetables common to the traditional Mediterranean Diet include: artichokes, arugula, beets, broccoli, brussel sprouts, cabbage, carrots, celery, collard greens, cucumbers, eggplant, kale, leeks, lemons, lettuce, mushrooms, okra, onions, peas, peppers, potatoes, pumpkin, radishes, rutabaga, shallots, spinach, sweet potatoes, turnips, zucchini - Fruits common to the Mediterranean Diet include: apples, apricots, avocados, cherries, clementines, dates, figs, grapefruits, grapes, melons, nectarines, oranges, peaches, pears, pomegranates, strawberries, tangerines  Fats - Replace butter and margarine with healthy oils, such as olive  oil, canola oil, and tahini  - Limit nuts to no more than a handful a day  - Nuts include walnuts, almonds, pecans, pistachios, pine nuts  - Limit or avoid candied, honey roasted or heavily salted nuts - Olives are central to the [employer]iterranean diet - can be eaten whole or used in a variety of dishes   Meats Protein - Limiting red meat: no more than a few times a month - When eating red meat: choose lean cuts and keep the portion to the size of deck of cards - Eggs: approx. 0 to 4 times a week  - Fish and lean poultry: at least 2 a week  - Healthy protein sources include, chicken, [place], lean beef, lamb - Increase intake of seafood such as tuna, salmon, trout, mackerel, shrimp, scallops - Avoid or limit high fat processed meats such as sausage and bacon  Dairy - Include moderate amounts of low fat dairy products  - Focus on healthy dairy such as fat free yogurt, skim milk, low or reduced fat cheese - Limit dairy products higher in fat such as whole or 2% milk, cheese, ice cream  Alcohol - Moderate amounts of red wine is ok  - No more than 5 oz daily for women (all ages) and men older than age 80  - No more than 10 oz of wine daily for men younger than 6565  Other - Limit sweets and other desserts  - Use herbs and spices instead of salt to flavor foods  - Herbs and spices common to the traditional Mediterranean Diet include: basil, bay leaves, chives, cloves, cumin, fennel, garlic, lavender, marjoram, mint, oregano, parsley, pepper, rosemary, sage, savory, sumac, tarragon, thyme   It's not just a diet,  it's a lifestyle:  . The Mediterranean diet includes lifestyle factors typical of those in the region  . Foods, drinks and meals are best eaten with others and savored . Daily physical activity is important for overall good health . This could be strenuous exercise like running and aerobics . This could also be more leisurely activities such as walking, housework, yard-work, or taking the  stairs . Moderation is the key; a balanced and healthy diet accommodates most foods and drinks . Consider portion sizes and frequency of consumption of certain foods   Meal Ideas & Options:  . Breakfast:  o Whole wheat toast or whole wheat English muffins with peanut butter & hard boiled egg o Steel cut oats topped with apples & cinnamon and skim milk  o Fresh fruit: banana, strawberries, melon, berries, peaches  o Smoothies: strawberries, bananas, greek yogurt, peanut butter o Low fat greek yogurt with blueberries and granola  o Egg white omelet with spinach and mushrooms o Breakfast couscous: whole wheat couscous, apricots, skim milk, cranberries  . Sandwiches:  o Hummus and grilled vegetables (peppers, zucchini, squash) on whole wheat bread   o Grilled chicken on whole wheat pita with lettuce, tomatoes, cucumbers or tzatziki  o Tuna salad on whole wheat bread: tuna salad made with greek yogurt, olives, red peppers, capers, green onions o Garlic rosemary lamb pita: lamb sauted with garlic, rosemary, salt & pepper; add lettuce, cucumber, greek yogurt to pita - flavor with lemon juice and black pepper  . Seafood:  o Mediterranean grilled salmon, seasoned with garlic, basil, parsley, lemon juice and black pepper o Shrimp, lemon, and spinach whole-grain pasta salad made with low fat greek yogurt  o Seared scallops with lemon orzo  o Seared tuna steaks seasoned salt, pepper, coriander topped with tomato mixture of olives, tomatoes, olive oil, minced garlic, parsley, green onions and cappers  . Meats:  o Herbed greek chicken salad with kalamata olives, cucumber, feta  o Red bell peppers stuffed with spinach, bulgur, lean ground beef (or lentils) & topped with feta   o Kebabs: skewers of chicken, tomatoes, onions, zucchini, squash  o [place] burgers: made with red onions, mint, dill, lemon juice, feta cheese topped with roasted red peppers . Vegetarian o Cucumber salad: cucumbers, artichoke  hearts, celery, red onion, feta cheese, tossed in olive oil & lemon juice  o Hummus and whole grain pita points with a greek salad (lettuce, tomato, feta, olives, cucumbers, red onion) o Lentil soup with celery, carrots made with vegetable broth, garlic, salt and pepper  o Tabouli salad: parsley, bulgur, mint, scallions, cucumbers, tomato, radishes, lemon juice, olive oil, salt and pepper.

## 2015-11-13 NOTE — Progress Notes (Signed)
      Review of Systems  Constitutional: Negative.  Negative for chills, diaphoresis, fever and weight loss.  HENT: Negative for tinnitus.   Eyes: Negative for blurred vision, double vision and photophobia.  Respiratory: Negative for cough, shortness of breath and wheezing.   Cardiovascular: Negative for chest pain, palpitations, orthopnea, claudication, leg swelling and PND.  Gastrointestinal: Negative for diarrhea, nausea and vomiting.  Genitourinary: Negative for frequency and urgency.  Musculoskeletal: Negative for joint pain and myalgias.  Skin: Negative for itching and rash.  Neurological: Negative for dizziness and headaches.  Endo/Heme/Allergies: Negative for polydipsia.  Psychiatric/Behavioral: Negative for depression, memory loss and suicidal ideas. The patient does not have insomnia.      

## 2015-11-14 LAB — LIPID PANEL
CHOLESTEROL: 220 mg/dL — AB (ref 125–200)
HDL: 53 mg/dL (ref >=46)
LDL CALC: 142 mg/dL — AB (ref <130)
Total CHOL/HDL Ratio: 4.2 Ratio (ref <=5.0)
Triglycerides: 124 mg/dL (ref <150)
VLDL: 25 mg/dL (ref <30)

## 2015-11-14 LAB — T3, FREE: T3 FREE: 2.8 pg/mL (ref 2.3–4.2)

## 2015-11-14 LAB — VITAMIN B12: Vitamin B-12: 479 pg/mL (ref 200–1100)

## 2015-11-14 LAB — TSH: TSH: 1.78 mIU/L

## 2015-11-14 LAB — VITAMIN D 25 HYDROXY (VIT D DEFICIENCY, FRACTURES): Vit D, 25-Hydroxy: 29 ng/mL — ABNORMAL LOW (ref 30–100)

## 2015-11-14 LAB — T4, FREE: FREE T4: 1 ng/dL (ref 0.8–1.8)

## 2015-11-29 NOTE — Assessment & Plan Note (Signed)
D/C mobic due to intolerance  Proper hydration and exercise  Prudent diet with anti-inflammatories

## 2015-11-29 NOTE — Assessment & Plan Note (Signed)
Will switch from combination pill down to just the one.  Monitor blood pressure at home  Call me immediately with any questions or if you develop any symptoms

## 2015-11-29 NOTE — Assessment & Plan Note (Signed)

## 2015-11-29 NOTE — Assessment & Plan Note (Signed)
DC Lexapro  Advise going to a counselor\psychologist for CBT.  Importance of Diet and lifestyle modifications including regular exercise stressed

## 2015-11-29 NOTE — Assessment & Plan Note (Signed)
Importance of compliance stressed to patient.

## 2015-11-30 NOTE — Progress Notes (Deleted)
      Review of Systems  Constitutional: Negative.  Negative for chills, diaphoresis, fever and weight loss.  HENT: Negative for tinnitus.   Eyes: Negative for blurred vision, double vision and photophobia.  Respiratory: Negative for cough, shortness of breath and wheezing.   Cardiovascular: Negative for chest pain, palpitations, orthopnea, claudication, leg swelling and PND.  Gastrointestinal: Negative for diarrhea, nausea and vomiting.  Genitourinary: Negative for frequency and urgency.  Musculoskeletal: Negative for joint pain and myalgias.  Skin: Negative for itching and rash.  Neurological: Negative for dizziness and headaches.  Endo/Heme/Allergies: Negative for polydipsia.  Psychiatric/Behavioral: Negative for depression, memory loss and suicidal ideas. The patient does not have insomnia.

## 2015-12-08 ENCOUNTER — Ambulatory Visit: Payer: Medicare Other | Admitting: Family Medicine

## 2015-12-25 ENCOUNTER — Emergency Department (HOSPITAL_COMMUNITY)
Admission: EM | Admit: 2015-12-25 | Discharge: 2015-12-25 | Disposition: A | Discharge status: Home / Self Care | Payer: Medicare Other | Attending: Emergency Medicine | Admitting: Emergency Medicine

## 2015-12-25 ENCOUNTER — Encounter (HOSPITAL_COMMUNITY): Payer: Self-pay

## 2015-12-25 ENCOUNTER — Emergency Department (HOSPITAL_COMMUNITY): Payer: Medicare Other

## 2015-12-25 DIAGNOSIS — Z79899 Other long term (current) drug therapy: Secondary | ICD-10-CM | POA: Diagnosis not present

## 2015-12-25 DIAGNOSIS — K921 Melena: Secondary | ICD-10-CM | POA: Insufficient documentation

## 2015-12-25 DIAGNOSIS — I10 Essential (primary) hypertension: Secondary | ICD-10-CM | POA: Insufficient documentation

## 2015-12-25 DIAGNOSIS — R1030 Lower abdominal pain, unspecified: Secondary | ICD-10-CM | POA: Diagnosis not present

## 2015-12-25 DIAGNOSIS — E039 Hypothyroidism, unspecified: Secondary | ICD-10-CM | POA: Diagnosis not present

## 2015-12-25 DIAGNOSIS — Z96652 Presence of left artificial knee joint: Secondary | ICD-10-CM | POA: Insufficient documentation

## 2015-12-25 LAB — CBC
HCT: 38.2 % (ref 36.0–46.0)
Hemoglobin: 13.1 g/dL (ref 12.0–15.0)
MCH: 31.2 pg (ref 26.0–34.0)
MCHC: 34.3 g/dL (ref 30.0–36.0)
MCV: 91 fL (ref 78.0–100.0)
PLATELETS: 312 10*3/uL (ref 150–400)
RBC: 4.2 MIL/uL (ref 3.87–5.11)
RDW: 12.9 % (ref 11.5–15.5)
WBC: 7.8 10*3/uL (ref 4.0–10.5)

## 2015-12-25 LAB — COMPREHENSIVE METABOLIC PANEL
ALBUMIN: 3.9 g/dL (ref 3.5–5.0)
ALK PHOS: 57 U/L (ref 38–126)
ALT: 12 U/L — AB (ref 14–54)
AST: 18 U/L (ref 15–41)
Anion gap: 12 (ref 5–15)
BILIRUBIN TOTAL: 0.4 mg/dL (ref 0.3–1.2)
BUN: 11 mg/dL (ref 6–20)
CALCIUM: 9.3 mg/dL (ref 8.9–10.3)
CO2: 23 mmol/L (ref 22–32)
CREATININE: 0.7 mg/dL (ref 0.44–1.00)
Chloride: 100 mmol/L — ABNORMAL LOW (ref 101–111)
GFR calc Af Amer: >60 mL/min (ref >60)
GFR calc non Af Amer: >60 mL/min (ref >60)
GLUCOSE: 122 mg/dL — AB (ref 65–99)
Potassium: 3.7 mmol/L (ref 3.5–5.1)
SODIUM: 135 mmol/L (ref 135–145)
TOTAL PROTEIN: 6.8 g/dL (ref 6.5–8.1)

## 2015-12-25 LAB — TYPE AND SCREEN
ABO/RH(D): A POS
Antibody Screen: NEGATIVE

## 2015-12-25 MED ORDER — IOPAMIDOL (ISOVUE-300) INJECTION 61%
INTRAVENOUS | Status: AC
  Administered 2015-12-25: 100 mL
  Filled 2015-12-25: qty 100

## 2015-12-25 NOTE — ED Provider Notes (Signed)
[facility]-EMERGENCY DEPT Provider Note   CSN: [id number] Arrival date & time: 12/25/15  1735     History   Chief Complaint Chief Complaint  Patient presents with  . Abdominal Pain  . Rectal Bleeding    HPI [name] is a 82 y.o. female.  The history is provided by the patient. No language interpreter was used.  Abdominal Pain   Associated symptoms include hematochezia.  Rectal Bleeding  Associated symptoms: abdominal pain     Alexa HintJoy S Ausley is a 82 y.o. female who presents to the Emergency Department complaining of rectal bleeding.  She presents for evaluation of rectal bleeding that started today. She reports one large bloody bowel movement that started at 2:00 pm with ongoing rectal bleeding. The bleeding is described as bright red. She also endorses lower abdominal pain that has been intermittent over the last year. No fevers, chest pain. She does have chronic intermittent shortness of breath, unchanged from baseline. She does not take any blood thinners. She denies any history of rectal bleeding in the past.  Past Medical History:  Diagnosis Date  . Anxiety   . Anxiety and depression   . Depression   . Diverticulosis of colon (without mention of hemorrhage)   . Hiatal hernia   . Hyperlipidemia   . Hypertension   . Non compliance w medication regimen   . Other and unspecified noninfectious gastroenteritis and colitis(558.9)   . Pneumonia     Patient Active Problem List   Diagnosis Date Noted  . Hypothyroidism 10/10/2015  . Adjustment disorder with mixed anxiety and depressed mood 10/10/2015  . Sleeping difficulty 10/10/2015  . Mixed hyperlipidemia 10/10/2015  . Chronic Chest pain- unknwn etiology- noncardiogenic 10/10/2015  . Generalized OA 10/10/2015  . Overweight (BMI 25.0-29.9) 10/10/2015  . History of noncompliance with medical treatment 10/10/2015  . Hypertension 10/08/2015  . Diverticulosis of colon (without mention of hemorrhage) 10/08/2015  . GAD  (generalized anxiety disorder) 10/08/2015    Past Surgical History:  Procedure Laterality Date  . ABDOMINAL HYSTERECTOMY    . ABDOMINAL HYSTERECTOMY    . BACK SURGERY    . INCONTINENCE SURGERY    . LUMBAR SPINE SURGERY  2009  . NECK SURGERY    . SHOULDER ARTHROSCOPY  2007   left  . SPINAL FIXATION SURGERY     Multiple  . TOTAL KNEE ARTHROPLASTY  2007   left  . VENTRAL HERNIA REPAIR  2003    OB History    No data available       Home Medications    Prior to Admission medications   Medication Sig Start Date End Date Taking? Authorizing Provider  acetaminophen (TYLENOL) 650 MG CR tablet Take 650 mg by mouth daily.   Yes Historical Provider, MD  albuterol (PROVENTIL) (2.5 MG/3ML) 0.083% nebulizer solution Take 2.5 mg by nebulization every 6 (six) hours as needed for wheezing or shortness of breath.   Yes Historical Provider, MD  amLODipine-benazepril (LOTREL) 5-20 MG capsule Take 1 capsule by mouth daily. 10/08/15  Yes Historical Provider, MD  cycloSPORINE (RESTASIS) 0.05 % ophthalmic emulsion Place 1 drop into both eyes 2 (two) times daily.   Yes Historical Provider, MD  amLODipine (NORVASC) 5 MG tablet Take 1 tablet (5 mg total) by mouth daily. Patient not taking: Reported on 12/25/2015 11/13/15   Thomasene Loteborah Opalski, DO    Family History Family History  Problem Relation Age of Onset  . Coronary artery disease Father 473  . Diabetes Father   .  Coronary artery disease Mother   . Diabetes Mother   . Diabetes Sister   . Diabetes Sister     Social History Social History  Substance Use Topics  . Smoking status: Never Smoker  . Smokeless tobacco: Never Used  . Alcohol use No     Allergies   Escitalopram and Meloxicam   Review of Systems Review of Systems  Gastrointestinal: Positive for abdominal pain and hematochezia.  All other systems reviewed and are negative.    Physical Exam Updated Vital Signs BP 112/67   Pulse 90   Temp 97.9 F (36.6 C) (Oral)   Resp  16   Ht 5\' 4"  (1.626 m)   Wt 145 lb (65.8 kg)   SpO2 97%   BMI 24.89 kg/m   Physical Exam  Constitutional: She is oriented to person, place, and time. She appears well-developed and well-nourished.  HENT:  Head: Normocephalic and atraumatic.  Cardiovascular: Normal rate and regular rhythm.   No murmur heard. Pulmonary/Chest: Effort normal and breath sounds normal. No respiratory distress.  Abdominal: Soft. There is no rebound and no guarding.  Moderate lower abdominal tenderness without guarding or rebound  Genitourinary:  Genitourinary Comments: Rectal exam with external hemorrhoids, no gross blood  Musculoskeletal: She exhibits no edema or tenderness.  Neurological: She is alert and oriented to person, place, and time.  Skin: Skin is warm and dry.  Psychiatric: She has a normal mood and affect. Her behavior is normal.  Nursing note and vitals reviewed.    ED Treatments / Results  Labs (all labs ordered are listed, but only abnormal results are displayed) Labs Reviewed  COMPREHENSIVE METABOLIC PANEL - Abnormal; Notable for the following:       Result Value   Chloride 100 (*)    Glucose, Bld 122 (*)    ALT 12 (*)    All other components within normal limits  CBC  URINALYSIS, ROUTINE W REFLEX MICROSCOPIC (NOT AT [facility])  POC OCCULT BLOOD, ED  TYPE AND SCREEN    EKG  EKG Interpretation  Date/Time:  Friday December 25 2015 18:12:08 EST Ventricular Rate:  96 PR Interval:  156 QRS Duration: 76 QT Interval:  384 QTC Calculation: 485 R Axis:   28 Text Interpretation:  Sinus rhythm with Premature supraventricular complexes Cannot rule out Anterior infarct , age undetermined Abnormal ECG Confirmed by Lincoln Brigham [phone]) on 12/25/2015 7:50:03 PM       Radiology Ct Abdomen Pelvis W Contrast  Result Date: 12/25/2015 CLINICAL DATA:  Bloody stools beginning at 2 p.m. today. Lower abdominal pain. EXAM: CT ABDOMEN AND PELVIS WITH CONTRAST TECHNIQUE: Multidetector CT imaging of  the abdomen and pelvis was performed using the standard protocol following bolus administration of intravenous contrast. CONTRAST:  ISOVUE-300 IOPAMIDOL (ISOVUE-300) INJECTION 61% COMPARISON:  CT abdomen and pelvis April 27, 2013 FINDINGS: LOWER CHEST: Mild heterogeneous lung attenuation associated with small airway disease. Bibasilar atelectasis. The heart size is mildly enlarged. Mitral annular calcifications. No pericardial effusion. Included heart size is normal. No pericardial effusion. HEPATOBILIARY: Distended gallbladder with layering tiny stones versus sludge. No pericholecystic inflammation. Normal liver. PANCREAS: Normal. SPLEEN: Normal. ADRENALS/URINARY TRACT: Kidneys are orthotopic, demonstrating symmetric enhancement. 3 mm nonobstructing LEFT lower pole nephrolithiasis. 2.5 cm benign-appearing LEFT lower pole cyst. Multifocal cortical scarring bilaterally. No hydronephrosis or solid renal masses. The unopacified ureters are normal in course and caliber. Delayed imaging through the kidneys demonstrates symmetric prompt contrast excretion within the proximal urinary collecting system. Urinary bladder is well  distended and unremarkable. Normal adrenal glands. STOMACH/BOWEL: Small hiatal hernia. The stomach, small and large bowel are normal in course and caliber without inflammatory changes. Small amount of small bowel feces compatible with chronic stasis . No significant diverticulosis. VASCULAR/LYMPHATIC: Aortoiliac vessels are normal in course and caliber, moderate calcific atherosclerosis. No lymphadenopathy by CT size criteria. REPRODUCTIVE: Status post hysterectomy. OTHER: No intraperitoneal free fluid or free air. MUSCULOSKELETAL: Nonacute. Status post L4-5 and L5-S1 laminectomies. IMPRESSION: No acute intra-abdominal or pelvic process. 3 mm nonobstructing LEFT lower pole nephrolithiasis. Gallbladder distension and sludge versus cholelithiasis without CT findings of acute cholecystitis.  Electronically Signed   By: Awilda Metroourtnay  Bloomer M.D.   On: 12/25/2015 22:10    Procedures Procedures (including critical care time)  Medications Ordered in ED Medications  iopamidol (ISOVUE-300) 61 % injection (100 mLs  Contrast Given 12/25/15 2117)     Initial Impression / Assessment and Plan / ED Course  I have reviewed the triage vital signs and the nursing notes.  Pertinent labs & imaging results that were available during my care of the patient were reviewed by me and considered in my medical decision making (see chart for details).  Clinical Course     Patient here with hematochezia prior to ED arrival. She has no gross blood on rectal examination with normal hemoglobin. She does have some mild lower abdominal tenderness on examination, CT abdomen is negative for acute diverticulitis. She does have gallstones and gallbladder with possible sludge. She has no upper abdominal tenderness and normal LFTs, current clinical picture is not consistent with cholecystitis. Discussed with patient PCP follow-up for hematochezia as well as close return precautions as well as GI follow-up.  Final Clinical Impressions(s) / ED Diagnoses   Final diagnoses:  Hematochezia  Lower abdominal pain    New Prescriptions Discharge Medication List as of 12/25/2015 10:33 PM       Tilden FossaElizabeth Dewarren Ledbetter, MD 12/25/15 2353

## 2015-12-25 NOTE — ED Notes (Signed)
Pt assisted to restroom and back without difficulty.

## 2015-12-25 NOTE — Discharge Instructions (Signed)
Your CT scan showed gallstones in your gallbladder - please follow up with your family doctor or gastroenterologist for recheck.  Get rechecked immediately if you have any new or concerning symptoms.

## 2015-12-25 NOTE — ED Notes (Signed)
Pt stable, understands discharge instructions, and reasons for return.   

## 2015-12-25 NOTE — ED Notes (Signed)
Patient transported to CT 
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
## 2016-01-01 ENCOUNTER — Encounter: Payer: Self-pay | Admitting: Gastroenterology

## 2016-01-03 ENCOUNTER — Other Ambulatory Visit: Payer: Self-pay | Admitting: Family Medicine

## 2016-02-19 ENCOUNTER — Ambulatory Visit: Payer: Medicare Other | Admitting: Gastroenterology

## 2016-03-28 ENCOUNTER — Telehealth: Payer: Self-pay

## 2016-03-28 ENCOUNTER — Ambulatory Visit: Payer: Medicare Other | Admitting: Gastroenterology

## 2016-03-28 NOTE — Telephone Encounter (Signed)
OK this time.

## 2016-03-28 NOTE — Telephone Encounter (Signed)
Do you want to charge? 

## 2016-05-17 ENCOUNTER — Ambulatory Visit: Payer: Medicare Other | Admitting: Gastroenterology

## 2016-11-15 ENCOUNTER — Other Ambulatory Visit: Payer: Self-pay | Admitting: Ophthalmology

## 2017-04-24 HISTORY — PX: MOLE REMOVAL: SHX2046

## 2017-05-03 ENCOUNTER — Emergency Department (HOSPITAL_COMMUNITY): Payer: Medicare Other

## 2017-05-03 ENCOUNTER — Inpatient Hospital Stay (HOSPITAL_COMMUNITY)
Admission: EM | Admit: 2017-05-03 | Discharge: 2017-05-07 | DRG: 394 | Disposition: A | Discharge status: Home / Self Care | Payer: Medicare Other | Attending: Internal Medicine | Admitting: Internal Medicine

## 2017-05-03 DIAGNOSIS — E860 Dehydration: Secondary | ICD-10-CM | POA: Diagnosis present

## 2017-05-03 DIAGNOSIS — R300 Dysuria: Secondary | ICD-10-CM | POA: Diagnosis present

## 2017-05-03 DIAGNOSIS — E785 Hyperlipidemia, unspecified: Secondary | ICD-10-CM | POA: Diagnosis present

## 2017-05-03 DIAGNOSIS — R1084 Generalized abdominal pain: Secondary | ICD-10-CM

## 2017-05-03 DIAGNOSIS — I159 Secondary hypertension, unspecified: Secondary | ICD-10-CM | POA: Diagnosis not present

## 2017-05-03 DIAGNOSIS — Z96652 Presence of left artificial knee joint: Secondary | ICD-10-CM | POA: Diagnosis present

## 2017-05-03 DIAGNOSIS — R197 Diarrhea, unspecified: Secondary | ICD-10-CM

## 2017-05-03 DIAGNOSIS — I10 Essential (primary) hypertension: Secondary | ICD-10-CM | POA: Diagnosis present

## 2017-05-03 DIAGNOSIS — R55 Syncope and collapse: Secondary | ICD-10-CM | POA: Diagnosis present

## 2017-05-03 DIAGNOSIS — K922 Gastrointestinal hemorrhage, unspecified: Secondary | ICD-10-CM | POA: Diagnosis not present

## 2017-05-03 DIAGNOSIS — R112 Nausea with vomiting, unspecified: Secondary | ICD-10-CM

## 2017-05-03 DIAGNOSIS — K921 Melena: Secondary | ICD-10-CM

## 2017-05-03 DIAGNOSIS — K529 Noninfective gastroenteritis and colitis, unspecified: Secondary | ICD-10-CM

## 2017-05-03 DIAGNOSIS — G8929 Other chronic pain: Secondary | ICD-10-CM | POA: Diagnosis present

## 2017-05-03 DIAGNOSIS — Z833 Family history of diabetes mellitus: Secondary | ICD-10-CM

## 2017-05-03 DIAGNOSIS — F329 Major depressive disorder, single episode, unspecified: Secondary | ICD-10-CM | POA: Diagnosis present

## 2017-05-03 DIAGNOSIS — Z8249 Family history of ischemic heart disease and other diseases of the circulatory system: Secondary | ICD-10-CM

## 2017-05-03 DIAGNOSIS — E876 Hypokalemia: Secondary | ICD-10-CM | POA: Diagnosis present

## 2017-05-03 DIAGNOSIS — F419 Anxiety disorder, unspecified: Secondary | ICD-10-CM | POA: Diagnosis present

## 2017-05-03 DIAGNOSIS — K55039 Acute (reversible) ischemia of large intestine, extent unspecified: Principal | ICD-10-CM | POA: Diagnosis present

## 2017-05-03 DIAGNOSIS — M549 Dorsalgia, unspecified: Secondary | ICD-10-CM | POA: Diagnosis present

## 2017-05-03 DIAGNOSIS — R350 Frequency of micturition: Secondary | ICD-10-CM | POA: Diagnosis present

## 2017-05-03 HISTORY — DX: Dorsalgia, unspecified: M54.9

## 2017-05-03 HISTORY — DX: Noninfective gastroenteritis and colitis, unspecified: K52.9

## 2017-05-03 HISTORY — DX: Personal history of other medical treatment: Z92.89

## 2017-05-03 HISTORY — DX: Other chronic pain: G89.29

## 2017-05-03 HISTORY — DX: Unspecified osteoarthritis, unspecified site: M19.90

## 2017-05-03 LAB — CBC WITH DIFFERENTIAL/PLATELET
BASOS ABS: 0 10*3/uL (ref 0.0–0.1)
BASOS PCT: 1 %
EOS PCT: 5 %
Eosinophils Absolute: 0.3 10*3/uL (ref 0.0–0.7)
HCT: 36.1 % (ref 36.0–46.0)
Hemoglobin: 12.3 g/dL (ref 12.0–15.0)
Lymphocytes Relative: 34 %
Lymphs Abs: 2 10*3/uL (ref 0.7–4.0)
MCH: 30.8 pg (ref 26.0–34.0)
MCHC: 34.1 g/dL (ref 30.0–36.0)
MCV: 90.3 fL (ref 78.0–100.0)
MONO ABS: 0.4 10*3/uL (ref 0.1–1.0)
MONOS PCT: 6 %
Neutro Abs: 3.2 10*3/uL (ref 1.7–7.7)
Neutrophils Relative %: 54 %
PLATELETS: 314 10*3/uL (ref 150–400)
RBC: 4 MIL/uL (ref 3.87–5.11)
RDW: 12.4 % (ref 11.5–15.5)
WBC: 5.9 10*3/uL (ref 4.0–10.5)

## 2017-05-03 LAB — COMPREHENSIVE METABOLIC PANEL
ALBUMIN: 3.8 g/dL (ref 3.5–5.0)
ALT: 11 U/L — ABNORMAL LOW (ref 14–54)
ANION GAP: 9 (ref 5–15)
AST: 15 U/L (ref 15–41)
Alkaline Phosphatase: 45 U/L (ref 38–126)
BILIRUBIN TOTAL: 0.3 mg/dL (ref 0.3–1.2)
BUN: 19 mg/dL (ref 6–20)
CHLORIDE: 101 mmol/L (ref 101–111)
CO2: 25 mmol/L (ref 22–32)
Calcium: 9.3 mg/dL (ref 8.9–10.3)
Creatinine, Ser: 0.76 mg/dL (ref 0.44–1.00)
GFR calc Af Amer: >60 mL/min (ref >60)
GLUCOSE: 106 mg/dL — AB (ref 65–99)
POTASSIUM: 3 mmol/L — AB (ref 3.5–5.1)
Sodium: 135 mmol/L (ref 135–145)
TOTAL PROTEIN: 7 g/dL (ref 6.5–8.1)

## 2017-05-03 LAB — I-STAT CG4 LACTIC ACID, ED: Lactic Acid, Venous: 0.81 mmol/L (ref 0.5–1.9)

## 2017-05-03 LAB — URINALYSIS, ROUTINE W REFLEX MICROSCOPIC
BILIRUBIN URINE: NEGATIVE
Glucose, UA: NEGATIVE mg/dL
Hgb urine dipstick: NEGATIVE
KETONES UR: NEGATIVE mg/dL
LEUKOCYTES UA: NEGATIVE
NITRITE: NEGATIVE
PH: 6 (ref 5.0–8.0)
Protein, ur: NEGATIVE mg/dL
Specific Gravity, Urine: 1.021 (ref 1.005–1.030)

## 2017-05-03 LAB — I-STAT TROPONIN, ED: TROPONIN I, POC: 0 ng/mL (ref 0.00–0.08)

## 2017-05-03 LAB — LIPASE, BLOOD: LIPASE: 31 U/L (ref 11–51)

## 2017-05-03 LAB — POC OCCULT BLOOD, ED: FECAL OCCULT BLD: POSITIVE — AB

## 2017-05-03 MED ORDER — IOPAMIDOL (ISOVUE-300) INJECTION 61%
INTRAVENOUS | Status: AC
  Filled 2017-05-03: qty 100

## 2017-05-03 MED ORDER — PIPERACILLIN-TAZOBACTAM 3.375 G IVPB
3.3750 g | Freq: Three times a day (TID) | INTRAVENOUS | Status: DC
  Administered 2017-05-04 – 2017-05-05 (×3): 3.375 g via INTRAVENOUS
  Filled 2017-05-03 (×5): qty 50

## 2017-05-03 MED ORDER — ALBUTEROL SULFATE (2.5 MG/3ML) 0.083% IN NEBU: 2.5000 mg | INHALATION_SOLUTION | Freq: Four times a day (QID) | RESPIRATORY_TRACT | Status: DC | PRN

## 2017-05-03 MED ORDER — PIPERACILLIN-TAZOBACTAM 3.375 G IVPB 30 MIN: 3.3750 g | Freq: Three times a day (TID) | INTRAVENOUS | Status: DC

## 2017-05-03 MED ORDER — IOPAMIDOL (ISOVUE-300) INJECTION 61%
100.0000 mL | Freq: Once | INTRAVENOUS | Status: AC | PRN
  Administered 2017-05-03: 100 mL via INTRAVENOUS

## 2017-05-03 MED ORDER — CYCLOSPORINE 0.05 % OP EMUL
1.0000 [drp] | Freq: Two times a day (BID) | OPHTHALMIC | Status: DC
  Administered 2017-05-04 – 2017-05-07 (×7): 1 [drp] via OPHTHALMIC
  Filled 2017-05-03 (×9): qty 1

## 2017-05-03 MED ORDER — FENTANYL CITRATE (PF) 100 MCG/2ML IJ SOLN
25.0000 ug | INTRAMUSCULAR | Status: DC | PRN
  Administered 2017-05-04 (×3): 25 ug via INTRAVENOUS
  Filled 2017-05-03 (×3): qty 2

## 2017-05-03 MED ORDER — SODIUM CHLORIDE 0.9 % IV SOLN
1000.0000 mL | INTRAVENOUS | Status: DC
  Administered 2017-05-04 – 2017-05-07 (×5): 1000 mL via INTRAVENOUS

## 2017-05-03 MED ORDER — PIPERACILLIN-TAZOBACTAM 3.375 G IVPB 30 MIN
3.3750 g | Freq: Once | INTRAVENOUS | Status: AC
  Administered 2017-05-04: 3.375 g via INTRAVENOUS
  Filled 2017-05-03: qty 50

## 2017-05-03 MED ORDER — POTASSIUM CHLORIDE 10 MEQ/100ML IV SOLN
10.0000 meq | INTRAVENOUS | Status: AC
  Administered 2017-05-04 (×4): 10 meq via INTRAVENOUS
  Filled 2017-05-03 (×4): qty 100

## 2017-05-03 MED ORDER — SODIUM CHLORIDE 0.9 % IV BOLUS (SEPSIS)
1000.0000 mL | Freq: Once | INTRAVENOUS | Status: AC
  Administered 2017-05-03: 1000 mL via INTRAVENOUS

## 2017-05-03 MED ORDER — ONDANSETRON HCL 4 MG PO TABS: 4.0000 mg | ORAL_TABLET | Freq: Four times a day (QID) | ORAL | Status: DC | PRN

## 2017-05-03 MED ORDER — FENTANYL CITRATE (PF) 100 MCG/2ML IJ SOLN
50.0000 ug | Freq: Once | INTRAMUSCULAR | Status: AC
  Administered 2017-05-04: 50 ug via INTRAVENOUS
  Filled 2017-05-03: qty 2

## 2017-05-03 MED ORDER — ACETAMINOPHEN 325 MG PO TABS: 650.0000 mg | ORAL_TABLET | Freq: Four times a day (QID) | ORAL | Status: DC | PRN

## 2017-05-03 MED ORDER — ONDANSETRON HCL 4 MG/2ML IJ SOLN
4.0000 mg | Freq: Once | INTRAMUSCULAR | Status: AC
  Administered 2017-05-04: 4 mg via INTRAVENOUS
  Filled 2017-05-03: qty 2

## 2017-05-03 MED ORDER — ONDANSETRON HCL 4 MG/2ML IJ SOLN: 4.0000 mg | Freq: Four times a day (QID) | INTRAMUSCULAR | Status: DC | PRN

## 2017-05-03 MED ORDER — ACETAMINOPHEN 650 MG RE SUPP: 650.0000 mg | Freq: Four times a day (QID) | RECTAL | Status: DC | PRN

## 2017-05-03 MED ORDER — POTASSIUM CHLORIDE 10 MEQ/100ML IV SOLN
10.0000 meq | Freq: Once | INTRAVENOUS | Status: AC
  Administered 2017-05-03: 10 meq via INTRAVENOUS
  Filled 2017-05-03: qty 100

## 2017-05-03 NOTE — H&P (Signed)
History and Physical    [name]:[id number] DOB: 1932-10-10 DOA: 05/03/2017  Referring MD/NP/PA: Elpidio Anis, PA-C PCP: Thomasene Lot, DO  Patient coming from: Home  Chief Complaint: Abdominal pain  I have personally briefly reviewed patient's old medical records in [facility]   HPI: [name] is a 84 y.o. female with medical history significant of HTN, HLD, diverticulosis, and anxiety/depression; who presents with complaints of lower abdominal pain over the last 6 days.  Patient states that the pain is constant, severe, and worse on the left lower quadrant. Associated symptoms included fever, chills,  generalized weakness, nausea, vomiting, and diarrhea.  Diarrhea was initially noted to have bright red blood present in it, but she reports that that is since improved.  Patient initially thought symptoms were related to possible food poisoning.  She reports that she had not been eating or drinking very much due to the symptoms. She reports passing out at some point prior to arrival.  Denies having any chest pain, shortness of breath, cough, dysuria, or recent antibiotic use.  Family have been urging her to come in but she had declined until today as she was not getting better.    ED Course: Upon admission into the emergency department patient was seen to be afebrile, blood pressure 121/82-162/95, and all other vital signs relatively within normal limits.  Labs revealed potassium of 3 and was otherwise appeared within normal limits.  CT scan showed a 7 cm segment of descending colon thickening suggestive of ischemic colitis. Patient was started on Zosyn.  Dr. Lindie Spruce of general surgery was consulted.  Review of Systems  Constitutional: Positive for chills, diaphoresis, fever and malaise/fatigue.  HENT: Negative for ear discharge and nosebleeds.   Eyes: Negative for double vision and photophobia.  Respiratory: Negative for cough and shortness of breath.   Cardiovascular:  Negative for chest pain and leg swelling.  Gastrointestinal: Positive for abdominal pain, blood in stool, diarrhea, nausea and vomiting.  Genitourinary: Negative for dysuria and frequency.  Musculoskeletal: Negative for myalgias and neck pain.  Skin: Negative for itching and rash.  Neurological: Positive for loss of consciousness. Negative for focal weakness.  Psychiatric/Behavioral: Negative for memory loss and substance abuse.    Past Medical History:  Diagnosis Date  . Anxiety   . Anxiety and depression   . Depression   . Diverticulosis of colon (without mention of hemorrhage)   . Hiatal hernia   . Hyperlipidemia   . Hypertension   . Non compliance w medication regimen   . Other and unspecified noninfectious gastroenteritis and colitis(558.9)   . Pneumonia     Past Surgical History:  Procedure Laterality Date  . ABDOMINAL HYSTERECTOMY    . ABDOMINAL HYSTERECTOMY    . BACK SURGERY    . INCONTINENCE SURGERY    . LUMBAR SPINE SURGERY  2009  . NECK SURGERY    . SHOULDER ARTHROSCOPY  2007   left  . SPINAL FIXATION SURGERY     Multiple  . TOTAL KNEE ARTHROPLASTY  2007   left  . VENTRAL HERNIA REPAIR  2003     reports that she has never smoked. She has never used smokeless tobacco. She reports that she does not drink alcohol or use drugs.  Allergies  Allergen Reactions  . Escitalopram Diarrhea  . Meloxicam Other (See Comments)    insomnia    Family History  Problem Relation Age of Onset  . Coronary artery disease Father 42  . Diabetes Father   .  Coronary artery disease Mother   . Diabetes Mother   . Diabetes Sister   . Diabetes Sister     Prior to Admission medications   Medication Sig Start Date End Date Taking? Authorizing Provider  HYDROcodone-acetaminophen (NORCO/VICODIN) 5-325 MG tablet Take 1 tablet by mouth 3 (three) times daily as needed. 04/22/17  Yes [provider]  acetaminophen (TYLENOL) 650 MG CR tablet Take 650 mg by mouth daily.     [provider]  albuterol (PROVENTIL) (2.5 MG/3ML) 0.083% nebulizer solution Take 2.5 mg by nebulization every 6 (six) hours as needed for wheezing or shortness of breath.    [provider]  amLODipine (NORVASC) 5 MG tablet Take 1 tablet (5 mg total) by mouth daily. Patient not taking: Reported on 12/25/2015 11/13/15   Thomasene Lot, DO  amLODipine-benazepril (LOTREL) 5-20 MG capsule Take 1 capsule by mouth daily. 10/08/15   [provider]  cycloSPORINE (RESTASIS) 0.05 % ophthalmic emulsion Place 1 drop into both eyes 2 (two) times daily.    [provider]    Physical Exam:  Constitutional: Sick appearing elderly female who is able to follow commands Vitals:   05/03/17 1834 05/03/17 2122 05/03/17 2215  BP: 121/82 (!) 142/88 (!) 162/95  Pulse: 82 71 76  Resp: 18 16 11   Temp: 99 F (37.2 C)    TempSrc: Oral    SpO2: 98% 98% 98%   Eyes: PERRL, lids and conjunctivae normal ENMT: Mucous membranes are dry. posterior pharynx clear of any exudate or lesions.Normal dentition.  Neck: normal, supple, no masses, no thyromegaly Respiratory: clear to auscultation bilaterally, no wheezing, no crackles. Normal respiratory effort. No accessory muscle use.  Cardiovascular: Regular rate and rhythm, no murmurs / rubs / gallops. No extremity edema. 2+ pedal pulses. No carotid bruits.  Abdomen: Generalized tenderness to palpation noted but most significant in the left lower quadrant, no masses palpated. No hepatosplenomegaly. Bowel sounds positive.  Musculoskeletal: no clubbing / cyanosis. No joint deformity upper and lower extremities. Good ROM, no contractures. Normal muscle tone.  Skin: no rashes, lesions, ulcers. No induration Neurologic: CN 2-12 grossly intact. Sensation intact, DTR normal. Strength 5/5 in all 4.  Psychiatric: Normal judgment and insight. Alert and oriented x 3. Normal mood.     Labs on Admission: I have personally reviewed following labs and  imaging studies  CBC: Recent Labs  Lab 05/03/17 1848  WBC 5.9  NEUTROABS 3.2  HGB 12.3  HCT 36.1  MCV 90.3  PLT 314   Basic Metabolic Panel: Recent Labs  Lab 05/03/17 1848  NA 135  K 3.0*  CL 101  CO2 25  GLUCOSE 106*  BUN 19  CREATININE 0.76  CALCIUM 9.3   GFR: CrCl cannot be calculated (Unknown ideal weight.). Liver Function Tests: Recent Labs  Lab 05/03/17 1848  AST 15  ALT 11*  ALKPHOS 45  BILITOT 0.3  PROT 7.0  ALBUMIN 3.8   Recent Labs  Lab 05/03/17 1848  LIPASE 31   No results for input(s): AMMONIA in the last 168 hours. Coagulation Profile: No results for input(s): INR, PROTIME in the last 168 hours. Cardiac Enzymes: No results for input(s): CKTOTAL, CKMB, CKMBINDEX, TROPONINI in the last 168 hours. BNP (last 3 results) No results for input(s): PROBNP in the last 8760 hours. HbA1C: No results for input(s): HGBA1C in the last 72 hours. CBG: No results for input(s): GLUCAP in the last 168 hours. Lipid Profile: No results for input(s): CHOL, HDL, LDLCALC, TRIG, CHOLHDL, LDLDIRECT  in the last 72 hours. Thyroid Function Tests: No results for input(s): TSH, T4TOTAL, FREET4, T3FREE, THYROIDAB in the last 72 hours. Anemia Panel: No results for input(s): VITAMINB12, FOLATE, FERRITIN, TIBC, IRON, RETICCTPCT in the last 72 hours. Urine analysis:    Component Value Date/Time   COLORURINE AMBER BIOCHEMICALS MAY BE AFFECTED BY COLOR (A) 12/26/2007 0308   APPEARANCEUR CLOUDY (A) 12/26/2007 0308   LABSPEC 1.023 12/26/2007 0308   PHURINE 6.0 12/26/2007 0308   GLUCOSEU NEGATIVE 12/26/2007 0308   HGBUR NEGATIVE 12/26/2007 0308   BILIRUBINUR SMALL (A) 12/26/2007 0308   KETONESUR NEGATIVE 12/26/2007 0308   PROTEINUR 30 (A) 12/26/2007 0308   UROBILINOGEN 1.0 12/26/2007 0308   NITRITE NEGATIVE 12/26/2007 0308   LEUKOCYTESUR MODERATE (A) 12/26/2007 0308   Sepsis Labs: No results found for this or any previous visit (from the past 240 hour(s)).    Radiological Exams on Admission: Ct Abdomen Pelvis W Contrast  Result Date: 05/03/2017 CLINICAL DATA:  Left-sided abdominal pain with nausea, vomiting and diarrhea over the last week. EXAM: CT ABDOMEN AND PELVIS WITH CONTRAST TECHNIQUE: Multidetector CT imaging of the abdomen and pelvis was performed using the standard protocol following bolus administration of intravenous contrast. CONTRAST:  ISOVUE-300 IOPAMIDOL (ISOVUE-300) INJECTION 61% COMPARISON:  12/25/2015 FINDINGS: Lower chest: Negative Hepatobiliary: Liver parenchyma appears normal except for a benign cluster of calcifications in the right lobe. Gallbladder shows some layering stones and sludge. No CT evidence of cholecystitis or obstruction. Pancreas: Normal Spleen: Normal Adrenals/Urinary Tract: Adrenal glands are normal. Simple cysts of the kidneys. No acute renal finding. No obstruction. Bladder appears normal. Stomach/Bowel: No sign of obstruction or mass lesion. Patient does not have diverticulosis. The appendix is normal. However, there is a segment of the midportion of the descending colon that appears to show wall thickening. This extends over about 7 cm. This looks like a localized segment of colitis. Most likely etiology would be localized ischemic colitis. Minimal surrounding edema in the fat. Vascular/Lymphatic: Advanced aortic atherosclerosis with calcified and soft plaque. No aneurysm. Celiac and superior mesenteric artery are patent. I cannot definitely identify patent inferior mesenteric artery. Reproductive: No pelvic mass.  Previous hysterectomy. Other: No free fluid or air. Musculoskeletal: Extensive chronic degenerative changes of the spine. IMPRESSION: Short segment of wall thickening of the mid descending colon extending over about 7 cm. In this patient without any visible diverticula an with advanced atherosclerotic disease of the aorta and nonvisualization of flow in the IMA, this probably represents a short segment of  ischemic colitis. No advanced finding. There is only mild stranding in the surrounding fat. Gallstones and sludge dependent in the gallbladder but no CT evidence of cholecystitis or obstruction. Electronically Signed   By: Paulina Fusi M.D.   On: 05/03/2017 20:58    EKG: Independently reviewed. NSR at 82 bpm  Assessment/Plan Abdominal pain  2/2 colitis with melena: Acute.  Patient presents with 1 week of abdominal pain distention, nausea, vomiting, and diarrhea.  Hemoglobin appears stable at 12.3.  Guaiac was noted to be positive for signs of blood.  CT scan showing signs suggestive of possible ischemic colitis.  Patient was started on Zosyn.  Dr. Lindie Spruce of general surgery was consulted.   - Admit to telemetry - N.p.o. - Monitor intake and output - IV fluids as tolerated - Pain control - Continue to monitor hemoglobin - Empiric antibiotics of Zosyn was started, de-escalate when medically appropriate - Appreciate general surgery consultative services, will follow-up for further recommendations  Syncope:  Acute.  Suspect likely vasovagal as occurred while patient using restroom. - Follow-up telemetry overnight  Nausea vomiting and diarrhea: Acute.  Suspect secondary to above. - Monitor intake and output  Hypokalemia: Acute.  Initial potassium noted to be 3.  Patient was given 10 mg of potassium chloride IV on the ED. - Magnesium - Continue 40 mEq of potassium chloride IV  Essential hypertension - Restart home medications when medically  DVT prophylaxis: SCDs Code Status: Full Family Communication: None Disposition Plan: Likely discharge home once medically stable Consults called: General surgery Admission status: Inpatient  Clydie Braunondell A Smith MD [facility] Pager [phone]   If 7PM-7AM, please contact night-coverage [email or web address] Password [email or web address]  05/03/2017, 11:13 PM

## 2017-05-03 NOTE — ED Provider Notes (Signed)
Patient placed in Quick Look pathway, seen and evaluated   Chief Complaint: abdominal pain  HPI:   Pain in left abdomen and flank pain for 6 days. Associated nausea and vomiting, diarrhea. Pt reports syncopal episode today in the bathroom. Reports associated weakness, headache. Reports blood in stool, describes it as "clots." also admists to urinary frequency, urgency, dysuria. Reports subjective fevers and chills  ROS: Positive for abdominal pain, nausea, vomiting, diarrhea, dysuria, hematuria, rectal bleeding, urinary frequency and urgency, weakness, dizziness, syncopal episode, headache  Physical Exam:   Gen: No distress  Neuro: Awake and Alert  Skin: Warm    Focused Exam: Ill-appearing, lungs clear to auscultation bilaterally, regular heart rate and rhythm.  Abdomen appears to be distended, diffusely tender.  No guarding.  Patient with abdominal pain, nausea, vomiting, diarrhea, syncopal episode today in the bathroom.  Will get labs, get CT abdomen pelvis for further evaluation.  Urinalysis ordered as well.  Patient received a "shot of nausea medicine" at doctor's office and feels slightly better.  Vital signs are normal.     Initiation of care has begun. The patient has been counseled on the process, plan, and necessity for staying for the completion/evaluation, and the remainder of the medical screening examination    Jaynie CrumbleKirichenko, Bohdi Leeds, PA-C 05/03/17 1939    Melene PlanFloyd, Dan, DO 05/03/17 2300

## 2017-05-03 NOTE — ED Triage Notes (Signed)
Pt to ER for evaluation of abdominal pain onset last week. Abdomen is distended, reports large amounts of diarrhea. A/o x4.

## 2017-05-03 NOTE — ED Provider Notes (Signed)
[facility] EM[facility]GENCY [facility] Provider Note   CSN: [id number] Arrival date & time: 05/03/17  1738     History   Chief Complaint Chief Complaint  Patient presents with  . Abdominal Pain    HPI [name] is a 84 y.o. female.  Patient presents for evaluation of abdominal pain, diarrhea, nausea and vomiting x 1 week. She reports fever on and off through the week. Her pain started in the LLQ and is now diffuse, still worst in the LLQ, and she feels her stomach is hard and swollen. She has persistent nausea with vomiting as well as diarrhea. She states that in the first day of her illness she saw BRB in her stool but this has improved. It is not unusual for her stool to appear intermittently normal and 'black' and this hasn't changed since she became ill. She denies previous colonoscopy. She is not anticoagulated and reports history of HTN, HLD only. No urinary symptoms. She states that she feels she is getting progressively weaker and passed out today while having a bowel movement. No injury during the episode. No CP, SOB, cough.  The history is provided by the patient. No language interpreter was used.    Past Medical History:  Diagnosis Date  . Anxiety   . Anxiety and depression   . Depression   . Diverticulosis of colon (without mention of hemorrhage)   . Hiatal hernia   . Hyperlipidemia   . Hypertension   . Non compliance w medication regimen   . Other and unspecified noninfectious gastroenteritis and colitis(558.9)   . Pneumonia     Patient Active Problem List   Diagnosis Date Noted  . Hypothyroidism 10/10/2015  . Adjustment disorder with mixed anxiety and depressed mood 10/10/2015  . Sleeping difficulty 10/10/2015  . Mixed hyperlipidemia 10/10/2015  . Chronic Chest pain- unknwn etiology- noncardiogenic 10/10/2015  . Generalized OA 10/10/2015  . Overweight (BMI 25.0-29.9) 10/10/2015  . History of noncompliance with medical treatment 10/10/2015    . Hypertension 10/08/2015  . Diverticulosis of colon (without mention of hemorrhage) 10/08/2015  . GAD (generalized anxiety disorder) 10/08/2015    Past Surgical History:  Procedure Laterality Date  . ABDOMINAL HYSTERECTOMY    . ABDOMINAL HYSTERECTOMY    . BACK SURGERY    . INCONTINENCE SURGERY    . LUMBAR SPINE SURGERY  2009  . NECK SURGERY    . SHOULDER ARTHROSCOPY  2007   left  . SPINAL FIXATION SURGERY     Multiple  . TOTAL KNEE ARTHROPLASTY  2007   left  . VENTRAL HERNIA REPAIR  2003     OB History   None      Home Medications    Prior to Admission medications   Medication Sig Start Date End Date Taking? Authorizing Provider  HYDROcodone-acetaminophen (NORCO/VICODIN) 5-325 MG tablet Take 1 tablet by mouth 3 (three) times daily as needed. 04/22/17  Yes [provider]  acetaminophen (TYLENOL) 650 MG CR tablet Take 650 mg by mouth daily.    [provider]  albuterol (PROVENTIL) (2.5 MG/3ML) 0.083% nebulizer solution Take 2.5 mg by nebulization every 6 (six) hours as needed for wheezing or shortness of breath.    [provider]  amLODipine (NORVASC) 5 MG tablet Take 1 tablet (5 mg total) by mouth daily. Patient not taking: Reported on 12/25/2015 11/13/15   Thomasene Lotpalski, Deborah, DO  amLODipine-benazepril (LOTREL) 5-20 MG capsule Take 1 capsule by mouth daily. 10/08/15   [provider]  cycloSPORINE (RESTASIS) 0.05 % ophthalmic emulsion Place 1 drop into both eyes 2 (two) times daily.    [provider]    Family History Family History  Problem Relation Age of Onset  . Coronary artery disease Father 2  . Diabetes Father   . Coronary artery disease Mother   . Diabetes Mother   . Diabetes Sister   . Diabetes Sister     Social History Social History   Tobacco Use  . Smoking status: Never Smoker  . Smokeless tobacco: Never Used  Substance Use Topics  . Alcohol use: No  . Drug use: No     Allergies   Escitalopram  and Meloxicam   Review of Systems Review of Systems  Constitutional: Positive for chills, fever and unexpected weight change (She thinks about 8 pounds in the last one week.).  HENT: Negative.   Respiratory: Negative.   Cardiovascular: Negative.   Gastrointestinal: Positive for abdominal distention, abdominal pain, blood in stool, diarrhea, nausea and vomiting.  Genitourinary: Negative.  Negative for dysuria and frequency.  Musculoskeletal: Negative.  Negative for back pain.  Skin: Negative.  Negative for pallor.  Neurological: Positive for syncope and weakness.     Physical Exam Updated Vital Signs BP (!) 162/95   Pulse 76   Temp 99 F (37.2 C) (Oral)   Resp 11   SpO2 98%   Physical Exam  Constitutional: She appears well-developed and well-nourished.  HENT:  Head: Normocephalic.  Mouth/Throat: Oropharynx is clear and moist.  Eyes:  No conjunctival pallor.   Neck: Normal range of motion. Neck supple.  Cardiovascular: Normal rate and regular rhythm.  No murmur heard. Pulmonary/Chest: Effort normal and breath sounds normal. She has no rhonchi. She has no rales.  Abdominal: Bowel sounds are normal. She exhibits distension. There is generalized tenderness (Greatest tenderness in the LLQ). There is no rebound and no guarding.  Genitourinary: Rectal exam shows guaiac positive stool. Rectal exam shows no tenderness.  Musculoskeletal: Normal range of motion.  Neurological: She is alert. No cranial nerve deficit.  Skin: Skin is warm and dry. No rash noted.  Psychiatric: She has a normal mood and affect.     ED Treatments / Results  Labs (all labs ordered are listed, but only abnormal results are displayed) Labs Reviewed  COMPREHENSIVE METABOLIC PANEL - Abnormal; Notable for the following components:      Result Value   Potassium 3.0 (*)    Glucose, Bld 106 (*)    ALT 11 (*)    All other components within normal limits  POC OCCULT BLOOD, ED - Abnormal; Notable for the  following components:   Fecal Occult Bld POSITIVE (*)    All other components within normal limits  CBC WITH DIFFERENTIAL/PLATELET  LIPASE, BLOOD  URINALYSIS, ROUTINE W REFLEX MICROSCOPIC  I-STAT CG4 LACTIC ACID, ED  I-STAT TROPONIN, ED  I-STAT CG4 LACTIC ACID, ED   Results for orders placed or performed during the hospital encounter of 05/03/17  CBC with Differential  Result Value Ref Range   WBC 5.9 4.0 - 10.5 K/uL   RBC 4.00 3.87 - 5.11 MIL/uL   Hemoglobin 12.3 12.0 - 15.0 g/dL   HCT 45.4 09.8 - 11.9 %   MCV 90.3 78.0 - 100.0 fL   MCH 30.8 26.0 - 34.0 pg   MCHC 34.1 30.0 - 36.0 g/dL   RDW 14.7 82.9 - 56.2 %   Platelets 314 150 - 400 K/uL   Neutrophils Relative % 54 %  Neutro Abs 3.2 1.7 - 7.7 K/uL   Lymphocytes Relative 34 %   Lymphs Abs 2.0 0.7 - 4.0 K/uL   Monocytes Relative 6 %   Monocytes Absolute 0.4 0.1 - 1.0 K/uL   Eosinophils Relative 5 %   Eosinophils Absolute 0.3 0.0 - 0.7 K/uL   Basophils Relative 1 %   Basophils Absolute 0.0 0.0 - 0.1 K/uL  Comprehensive metabolic panel  Result Value Ref Range   Sodium 135 135 - 145 mmol/L   Potassium 3.0 (L) 3.5 - 5.1 mmol/L   Chloride 101 101 - 111 mmol/L   CO2 25 22 - 32 mmol/L   Glucose, Bld 106 (H) 65 - 99 mg/dL   BUN 19 6 - 20 mg/dL   Creatinine, Ser 0.98 0.44 - 1.00 mg/dL   Calcium 9.3 8.9 - 11.9 mg/dL   Total Protein 7.0 6.5 - 8.1 g/dL   Albumin 3.8 3.5 - 5.0 g/dL   AST 15 15 - 41 U/L   ALT 11 (L) 14 - 54 U/L   Alkaline Phosphatase 45 38 - 126 U/L   Total Bilirubin 0.3 0.3 - 1.2 mg/dL   GFR calc non Af Amer >60 >60 mL/min   GFR calc Af Amer >60 >60 mL/min   Anion gap 9 5 - 15  Lipase, blood  Result Value Ref Range   Lipase 31 11 - 51 U/L  I-Stat CG4 Lactic Acid, ED  Result Value Ref Range   Lactic Acid, Venous 0.81 0.5 - 1.9 mmol/L  I-Stat Troponin, ED (not at [facility])  Result Value Ref Range   Troponin i, poc 0.00 0.00 - 0.08 ng/mL   Comment 3          POC occult blood, ED  Result Value Ref Range    Fecal Occult Bld POSITIVE (A) NEGATIVE    EKG None  Radiology Ct Abdomen Pelvis W Contrast  Result Date: 05/03/2017 CLINICAL DATA:  Left-sided abdominal pain with nausea, vomiting and diarrhea over the last week. EXAM: CT ABDOMEN AND PELVIS WITH CONTRAST TECHNIQUE: Multidetector CT imaging of the abdomen and pelvis was performed using the standard protocol following bolus administration of intravenous contrast. CONTRAST:  ISOVUE-300 IOPAMIDOL (ISOVUE-300) INJECTION 61% COMPARISON:  12/25/2015 FINDINGS: Lower chest: Negative Hepatobiliary: Liver parenchyma appears normal except for a benign cluster of calcifications in the right lobe. Gallbladder shows some layering stones and sludge. No CT evidence of cholecystitis or obstruction. Pancreas: Normal Spleen: Normal Adrenals/Urinary Tract: Adrenal glands are normal. Simple cysts of the kidneys. No acute renal finding. No obstruction. Bladder appears normal. Stomach/Bowel: No sign of obstruction or mass lesion. Patient does not have diverticulosis. The appendix is normal. However, there is a segment of the midportion of the descending colon that appears to show wall thickening. This extends over about 7 cm. This looks like a localized segment of colitis. Most likely etiology would be localized ischemic colitis. Minimal surrounding edema in the fat. Vascular/Lymphatic: Advanced aortic atherosclerosis with calcified and soft plaque. No aneurysm. Celiac and superior mesenteric artery are patent. I cannot definitely identify patent inferior mesenteric artery. Reproductive: No pelvic mass.  Previous hysterectomy. Other: No free fluid or air. Musculoskeletal: Extensive chronic degenerative changes of the spine. IMPRESSION: Short segment of wall thickening of the mid descending colon extending over about 7 cm. In this patient without any visible diverticula an with advanced atherosclerotic disease of the aorta and nonvisualization of flow in the IMA, this  probably represents a short segment of ischemic colitis. No  advanced finding. There is only mild stranding in the surrounding fat. Gallstones and sludge dependent in the gallbladder but no CT evidence of cholecystitis or obstruction. Electronically Signed   By: Paulina Fusi M.D.   On: 05/03/2017 20:58    Procedures Procedures (including critical care time)  Medications Ordered in ED Medications  iopamidol (ISOVUE-300) 61 % injection (has no administration in time range)  potassium chloride 10 mEq in 100 mL IVPB (has no administration in time range)  sodium chloride 0.9 % bolus 1,000 mL (has no administration in time range)    Followed by  0.9 %  sodium chloride infusion (has no administration in time range)  iopamidol (ISOVUE-300) 61 % injection 100 mL (100 mLs Intravenous Contrast Given 05/03/17 2033)     Initial Impression / Assessment and Plan / ED Course  I have reviewed the triage vital signs and the nursing notes.  Pertinent labs & imaging results that were available during my care of the patient were reviewed by me and considered in my medical decision making (see chart for details).     Patient here with one week of N, V, D and abdominal pain, weight loss and, today, syncopal episode.  She is hemodynamically stable. Mild hypokalemia - being repleted now. No leukocytosis or anemia. CT scan raises concern for ischemic colitis. Normal lactic acidosis.   Zosyn started. Discussed with Dr. Lindie Spruce who will provide consultation and advises admit to medicine. Discussed with Dr. Katrinka Blazing, [facility], who accepts the patient on to his service.   Final Clinical Impressions(s) / ED Diagnoses   Final diagnoses:  None   1. Abdominal pain 2. Lower GI bleeding 3. Abnormal CT abdomen 4. Hypokalemia   ED Discharge Orders    None       Elpidio Anis, PA-C 05/03/17 2321    Melene Plan, DO 05/03/17 2326

## 2017-05-03 NOTE — ED Notes (Signed)
[phone] Lucius ConnGlenda Bunch - Daughter requesting to be called when pt ready

## 2017-05-04 ENCOUNTER — Other Ambulatory Visit: Payer: Self-pay

## 2017-05-04 ENCOUNTER — Encounter (HOSPITAL_COMMUNITY): Payer: Self-pay | Admitting: General Practice

## 2017-05-04 DIAGNOSIS — I159 Secondary hypertension, unspecified: Secondary | ICD-10-CM

## 2017-05-04 DIAGNOSIS — R197 Diarrhea, unspecified: Secondary | ICD-10-CM

## 2017-05-04 DIAGNOSIS — R1084 Generalized abdominal pain: Secondary | ICD-10-CM

## 2017-05-04 DIAGNOSIS — R112 Nausea with vomiting, unspecified: Secondary | ICD-10-CM | POA: Diagnosis present

## 2017-05-04 DIAGNOSIS — R55 Syncope and collapse: Secondary | ICD-10-CM | POA: Diagnosis present

## 2017-05-04 DIAGNOSIS — K529 Noninfective gastroenteritis and colitis, unspecified: Secondary | ICD-10-CM

## 2017-05-04 DIAGNOSIS — K921 Melena: Secondary | ICD-10-CM | POA: Diagnosis present

## 2017-05-04 DIAGNOSIS — K922 Gastrointestinal hemorrhage, unspecified: Secondary | ICD-10-CM

## 2017-05-04 DIAGNOSIS — E876 Hypokalemia: Secondary | ICD-10-CM | POA: Diagnosis present

## 2017-05-04 LAB — CBC
HCT: 35.2 % — ABNORMAL LOW (ref 36.0–46.0)
HEMOGLOBIN: 11.8 g/dL — AB (ref 12.0–15.0)
MCH: 30.3 pg (ref 26.0–34.0)
MCHC: 33.5 g/dL (ref 30.0–36.0)
MCV: 90.3 fL (ref 78.0–100.0)
PLATELETS: 294 10*3/uL (ref 150–400)
RBC: 3.9 MIL/uL (ref 3.87–5.11)
RDW: 12.6 % (ref 11.5–15.5)
WBC: 6.4 10*3/uL (ref 4.0–10.5)

## 2017-05-04 LAB — BASIC METABOLIC PANEL
Anion gap: 7 (ref 5–15)
BUN: 13 mg/dL (ref 6–20)
CALCIUM: 8.7 mg/dL — AB (ref 8.9–10.3)
CHLORIDE: 103 mmol/L (ref 101–111)
CO2: 28 mmol/L (ref 22–32)
CREATININE: 0.73 mg/dL (ref 0.44–1.00)
GFR calc non Af Amer: >60 mL/min (ref >60)
Glucose, Bld: 96 mg/dL (ref 65–99)
Potassium: 4.6 mmol/L (ref 3.5–5.1)
SODIUM: 138 mmol/L (ref 135–145)

## 2017-05-04 LAB — MAGNESIUM: Magnesium: 1.8 mg/dL (ref 1.7–2.4)

## 2017-05-04 MED ORDER — OXYCODONE HCL 5 MG PO TABS
5.0000 mg | ORAL_TABLET | ORAL | Status: DC | PRN
  Administered 2017-05-04 – 2017-05-06 (×4): 5 mg via ORAL
  Filled 2017-05-04 (×4): qty 1

## 2017-05-04 MED ORDER — HYDRALAZINE HCL 20 MG/ML IJ SOLN: 10.0000 mg | INTRAMUSCULAR | Status: DC | PRN

## 2017-05-04 MED ORDER — HYDRALAZINE HCL 20 MG/ML IJ SOLN
10.0000 mg | INTRAMUSCULAR | Status: DC | PRN
  Administered 2017-05-04: 10 mg via INTRAVENOUS
  Filled 2017-05-04: qty 1

## 2017-05-04 NOTE — Consult Note (Signed)
[facility] Surgery Consult Note  [name] August 26, 1932  [id number].    Requesting MD: Fuller Plan, MD Chief Complaint/Reason for Consult: colitis  HPI:  [name] is a 84 y/o F with a PMH of hypertension, hyperlipidemia, diverticulosis, chronic back pain on hydrocodone, and anxiety/depression who presented to [facility] emergency department with a chief complaint of abdominal pain.  The patient was told by her physician to come to the emergency department.  Patient states the pain started about 6 days ago on 04/28/17.  She describes the pain as sharp lower abdominal pain that is worse over her left lower quadrant.  Pain is nonradiating.  The patient denies similar pain in the past. Patient states the pain became even worse on 4/6 after she ate a fish sandwich from [employer].  She also endorses having a loose bowel movement on 4/6 that contained bright red blood as well as some blood clots.  She denies any further frank blood in her stool since this time and states that her last bowel movement was on 04/30/17.  At baseline she has 1 bowel movement daily, or every other day.  Other associated symptoms include nausea and vomiting.  Patient has been unable to keep down solid food but states she can keep down some liquids and has been drinking Pepsi.  Patient denies a history of tobacco use.  Denies alcohol or illicit drug use.  Today she denies drug allergies however her chart lists Ecitalopram and meloxicam as allergies.  She denies the use of blood thinners.  Past abdominal surgeries include abdominal hysterectomy, bladder surgery for incontinence, and ventral hernia repair.  Patient is unsure of when her last colonoscopy was but denies a history of colon cancer.  States in the past she has had polyps removed that were benign. Last colonoscopy documented in epic was in 2001 and report showed a normal cecum to transverse colon, diverticulosis of descending and sigmoid colon, and rectal colitis with  probable rectal prolapse.  Today the patient denies fever but endorses weakness/fatigue.  She denies urinary symptoms, chest pain, shortness of breath.  In the emergency department her vital signs were stable aside from mild hypertension, she was found to be hypokalemic with potassium of 3, CBC and LFTs were within normal limits, and a CT scan of the abdomen and pelvis showed a 7 cm segment of colitis in the descending colon with concerns for ischemic colitis.  Lactic acid was within normal limits at 0.81.  Occult blood was positive.  ROS: Review of Systems  Constitutional: Positive for malaise/fatigue. Negative for chills and fever.  Respiratory: Negative.   Cardiovascular: Negative.   Gastrointestinal: Positive for abdominal pain, blood in stool, constipation, diarrhea, nausea and vomiting. Negative for melena.  Genitourinary: Negative.   Musculoskeletal: Positive for back pain.  Neurological: Positive for loss of consciousness.  All other systems reviewed and are negative.  Family History  Problem Relation Age of Onset  . Coronary artery disease Father 106  . Diabetes Father   . Coronary artery disease Mother   . Diabetes Mother   . Diabetes Sister   . Diabetes Sister     Past Medical History:  Diagnosis Date  . Anxiety   . Anxiety and depression   . Depression   . Diverticulosis of colon (without mention of hemorrhage)   . Hiatal hernia   . Hyperlipidemia   . Hypertension   . Non compliance w medication regimen   . Other and unspecified noninfectious gastroenteritis and colitis(558.9)   .  Pneumonia     Past Surgical History:  Procedure Laterality Date  . ABDOMINAL HYSTERECTOMY    . ABDOMINAL HYSTERECTOMY    . BACK SURGERY    . INCONTINENCE SURGERY    . LUMBAR SPINE SURGERY  2009  . NECK SURGERY    . SHOULDER ARTHROSCOPY  2007   left  . SPINAL FIXATION SURGERY     Multiple  . TOTAL KNEE ARTHROPLASTY  2007   left  . VENTRAL HERNIA REPAIR  2003    Social  History:  reports that she has never smoked. She has never used smokeless tobacco. She reports that she does not drink alcohol or use drugs.  Allergies:  Allergies  Allergen Reactions  . Escitalopram Diarrhea  . Meloxicam Other (See Comments)    insomnia     (Not in a hospital admission)  Blood pressure 127/73, pulse 72, temperature 99 F (37.2 C), temperature source Oral, resp. rate 17, SpO2 91 %. Physical Exam: Physical Exam  Constitutional: She is oriented to person, place, and time. She appears well-developed and well-nourished.  Non-toxic appearance. She does not appear ill. No distress.  HENT:  Head: Normocephalic and atraumatic.  Mouth/Throat: Oropharynx is clear and moist.  Eyes: Pupils are equal, round, and reactive to light. EOM are normal. No scleral icterus.  Cardiovascular: Normal rate, regular rhythm, normal heart sounds and intact distal pulses.  Pulmonary/Chest: Effort normal and breath sounds normal. No stridor. She has no rhonchi. She has no rales. She exhibits no tenderness.  Abdominal: Normal appearance and bowel sounds are normal. She exhibits distension (mild). There is tenderness in the suprapubic area and left lower quadrant. There is no rigidity and no guarding. A hernia (small umbilical ) is present.  Tender to light palpation LLQ and suprapubic region without peritonitis.  Neurological: She is alert and oriented to person, place, and time.  Skin: Skin is warm and dry. No rash noted.  Psychiatric: She has a normal mood and affect. Her behavior is normal.    Results for orders placed or performed during the hospital encounter of 05/03/17 (from the past 48 hour(s))  CBC with Differential     Status: None   Collection Time: 05/03/17  6:48 PM  Result Value Ref Range   WBC 5.9 4.0 - 10.5 K/uL   RBC 4.00 3.87 - 5.11 MIL/uL   Hemoglobin 12.3 12.0 - 15.0 g/dL   HCT 36.1 36.0 - 46.0 %   MCV 90.3 78.0 - 100.0 fL   MCH 30.8 26.0 - 34.0 pg   MCHC 34.1 30.0 -  36.0 g/dL   RDW 12.4 11.5 - 15.5 %   Platelets 314 150 - 400 K/uL   Neutrophils Relative % 54 %   Neutro Abs 3.2 1.7 - 7.7 K/uL   Lymphocytes Relative 34 %   Lymphs Abs 2.0 0.7 - 4.0 K/uL   Monocytes Relative 6 %   Monocytes Absolute 0.4 0.1 - 1.0 K/uL   Eosinophils Relative 5 %   Eosinophils Absolute 0.3 0.0 - 0.7 K/uL   Basophils Relative 1 %   Basophils Absolute 0.0 0.0 - 0.1 K/uL    Comment: Performed at [facility], [address], [place], [place] [address]  Comprehensive metabolic panel     Status: Abnormal   Collection Time: 05/03/17  6:48 PM  Result Value Ref Range   Sodium 135 135 - 145 mmol/L   Potassium 3.0 (L) 3.5 - 5.1 mmol/L   Chloride 101 101 - 111 mmol/L  CO2 25 22 - 32 mmol/L   Glucose, Bld 106 (H) 65 - 99 mg/dL   BUN 19 6 - 20 mg/dL   Creatinine, Ser 0.76 0.44 - 1.00 mg/dL   Calcium 9.3 8.9 - 10.3 mg/dL   Total Protein 7.0 6.5 - 8.1 g/dL   Albumin 3.8 3.5 - 5.0 g/dL   AST 15 15 - 41 U/L   ALT 11 (L) 14 - 54 U/L   Alkaline Phosphatase 45 38 - 126 U/L   Total Bilirubin 0.3 0.3 - 1.2 mg/dL   GFR calc non Af Amer >60 >60 mL/min   GFR calc Af Amer >60 >60 mL/min    Comment: (NOTE) The eGFR has been calculated using the CKD EPI equation. This calculation has not been validated in all clinical situations. eGFR's persistently <60 mL/min signify possible Chronic Kidney Disease.    Anion gap 9 5 - 15    Comment: Performed at [facility] [address], [place], [place] [address]  Lipase, blood     Status: None   Collection Time: 05/03/17  6:48 PM  Result Value Ref Range   Lipase 31 11 - 51 U/L    Comment: Performed at [facility] [address], [place], [place] [address]  I-Stat Troponin, ED (not at [facility])     Status: None   Collection Time: 05/03/17  7:00 PM  Result Value Ref Range   Troponin i, poc 0.00 0.00 - 0.08 ng/mL   Comment 3            Comment: Due to the release kinetics of cTnI, a negative result within the first  hours of the onset of symptoms does not rule out myocardial infarction with certainty. If myocardial infarction is still suspected, repeat the test at appropriate intervals.   I-Stat CG4 Lactic Acid, ED     Status: None   Collection Time: 05/03/17  7:02 PM  Result Value Ref Range   Lactic Acid, Venous 0.81 0.5 - 1.9 mmol/L  POC occult blood, ED     Status: Abnormal   Collection Time: 05/03/17 10:41 PM  Result Value Ref Range   Fecal Occult Bld POSITIVE (A) NEGATIVE  Urinalysis, Routine w reflex microscopic     Status: Abnormal   Collection Time: 05/03/17 10:54 PM  Result Value Ref Range   Color, Urine STRAW (A) YELLOW   APPearance CLEAR CLEAR   Specific Gravity, Urine 1.021 1.005 - 1.030   pH 6.0 5.0 - 8.0   Glucose, UA NEGATIVE NEGATIVE mg/dL   Hgb urine dipstick NEGATIVE NEGATIVE   Bilirubin Urine NEGATIVE NEGATIVE   Ketones, ur NEGATIVE NEGATIVE mg/dL   Protein, ur NEGATIVE NEGATIVE mg/dL   Nitrite NEGATIVE NEGATIVE   Leukocytes, UA NEGATIVE NEGATIVE    Comment: Performed at [facility], [address] [place], [place] [address]  Magnesium     Status: None   Collection Time: 05/04/17 12:54 AM  Result Value Ref Range   Magnesium 1.8 1.7 - 2.4 mg/dL    Comment: Performed at [facility] [address], [place] [address]  CBC     Status: Abnormal   Collection Time: 05/04/17  5:00 AM  Result Value Ref Range   WBC 6.4 4.0 - 10.5 K/uL   RBC 3.90 3.87 - 5.11 MIL/uL   Hemoglobin 11.8 (L) 12.0 - 15.0 g/dL   HCT 35.2 (L) 36.0 - 46.0 %   MCV 90.3 78.0 - 100.0 fL  MCH 30.3 26.0 - 34.0 pg   MCHC 33.5 30.0 - 36.0 g/dL   RDW 12.6 11.5 - 15.5 %   Platelets 294 150 - 400 K/uL    Comment: Performed at [facility], [place] [address] [place], [place] [address]  Basic metabolic panel     Status: Abnormal   Collection Time: 05/04/17  5:00 AM  Result Value Ref Range   Sodium 138 135 - 145 mmol/L   Potassium 4.6 3.5 - 5.1 mmol/L   Chloride 103 101 - 111  mmol/L   CO2 28 22 - 32 mmol/L   Glucose, Bld 96 65 - 99 mg/dL   BUN 13 6 - 20 mg/dL   Creatinine, Ser 0.73 0.44 - 1.00 mg/dL   Calcium 8.7 (L) 8.9 - 10.3 mg/dL   GFR calc non Af Amer >60 >60 mL/min   GFR calc Af Amer >60 >60 mL/min    Comment: (NOTE) The eGFR has been calculated using the CKD EPI equation. This calculation has not been validated in all clinical situations. eGFR's persistently <60 mL/min signify possible Chronic Kidney Disease.    Anion gap 7 5 - 15    Comment: Performed at [facility] [address], [place], [place] [address]   Ct Abdomen Pelvis W Contrast  Result Date: 05/03/2017 CLINICAL DATA:  Left-sided abdominal pain with nausea, vomiting and diarrhea over the last week. EXAM: CT ABDOMEN AND PELVIS WITH CONTRAST TECHNIQUE: Multidetector CT imaging of the abdomen and pelvis was performed using the standard protocol following bolus administration of intravenous contrast. CONTRAST:  173m ISOVUE-300 IOPAMIDOL (ISOVUE-300) INJECTION 61% COMPARISON:  12/25/2015 FINDINGS: Lower chest: Negative Hepatobiliary: Liver parenchyma appears normal except for a benign cluster of calcifications in the right lobe. Gallbladder shows some layering stones and sludge. No CT evidence of cholecystitis or obstruction. Pancreas: Normal Spleen: Normal Adrenals/Urinary Tract: Adrenal glands are normal. Simple cysts of the kidneys. No acute renal finding. No obstruction. Bladder appears normal. Stomach/Bowel: No sign of obstruction or mass lesion. Patient does not have diverticulosis. The appendix is normal. However, there is a segment of the midportion of the descending colon that appears to show wall thickening. This extends over about 7 cm. This looks like a localized segment of colitis. Most likely etiology would be localized ischemic colitis. Minimal surrounding edema in the fat. Vascular/Lymphatic: Advanced aortic atherosclerosis with calcified and soft plaque. No aneurysm. Celiac and  superior mesenteric artery are patent. I cannot definitely identify patent inferior mesenteric artery. Reproductive: No pelvic mass.  Previous hysterectomy. Other: No free fluid or air. Musculoskeletal: Extensive chronic degenerative changes of the spine. IMPRESSION: Short segment of wall thickening of the mid descending colon extending over about 7 cm. In this patient without any visible diverticula an with advanced atherosclerotic disease of the aorta and nonvisualization of flow in the IMA, this probably represents a short segment of ischemic colitis. No advanced finding. There is only mild stranding in the surrounding fat. Gallstones and sludge dependent in the gallbladder but no CT evidence of cholecystitis or obstruction. Electronically Signed   By: MNelson ChimesM.D.   On: 05/03/2017 20:58   Assessment/Plan HTN HLD  Diverticulosis  Anxiety/depression Back pain  Colitis of descending colon  - possibly ischemic in origin. No acute surgical needs - vitals stable, hemodynamically stable, no leukocytosis, lactate is WNL, no peritonitis on exam.  - agree with NPO and supportive treatment with IVF, electrolyte repletion, empiric abx, and observation.  - recommend GI consult  for further recommendations, including if a follow up colonoscopy will be necessary.    Jill Alexanders, [facility] 05/04/2017, 8:10 AM Pager: [phone] Consults: [phone] Mon-Fri 7:00 am-4:30 pm Sat-Sun 7:00 am-11:30 am

## 2017-05-04 NOTE — Progress Notes (Signed)
MD notified pts BP 150/100 manually pt asymptomatic will continue to monitor

## 2017-05-04 NOTE — ED Notes (Signed)
Smith, MD at bedside.  

## 2017-05-04 NOTE — Progress Notes (Signed)
[name] is a 84 y.o. female patient admitted from ED awake, alert - oriented  X 4 - no acute distress noted.  VSS - Blood pressure (!) 150/100, pulse 86, temperature 98.2 F (36.8 C), resp. rate 20, SpO2 98 %.    IV in place, occlusive dsg intact without redness.  Orientation to room, and floor completed with information packet given to patient/family.  Patient declined safety video at this time.  Admission INP armband ID verified with patient/family, and in place.   SR up x 2, fall assessment complete, with patient and family able to verbalize understanding of risk associated with falls, and verbalized understanding to call nsg before up out of bed.  Call light within reach, patient able to voice, and demonstrate understanding.  Skin, clean-dry- intact without evidence of bruising, or skin tears.   No evidence of skin break down noted on exam.     Will cont to eval and treat per MD orders.  Eligah EastErin M Chayden Garrelts, RN 05/04/2017 6:30 PM

## 2017-05-04 NOTE — ED Notes (Signed)
Pharmacy messaged about unverified meds 

## 2017-05-04 NOTE — ED Notes (Signed)
Bedside toilet in room, pt using toilet.

## 2017-05-04 NOTE — Progress Notes (Addendum)
[facility]                                                                              Patient Demographics  [name], is a 84 y.o. female, DOB - [birth year], ZOX:[id number]  Admit date - 05/03/2017   Admitting Physician No admitting provider for patient encounter.  Outpatient Primary MD for the patient is Thomasene Lot, DO  Outpatient specialists:   LOS - 1  days   Medical records reviewed and are as summarized below:    Chief Complaint  Patient presents with  . Abdominal Pain       Brief summary   Patient is a 84 year old female with hypertension, hyperlipidemia, diverticulosis, anxiety/depression presented with lower abdominal pain over the last 6 days, constant, severe in the left lower quadrant. Also reported fever, chills, generalized weakness, nausea, vomiting and diarrhea. Initially BM had BRB but has now improved. She reported that her symptoms started after eating out last week.  CT abdomen showed 7 cm segment of descending colon thickening suggestive of ischemic colitis.   Assessment & Plan    Principal Problem:  Acute Colitis: Ischemic versus infectious. Presented with nausea, vomiting, abdominal pain, diarrhea - Continue IV fluid hydration, NPO, antiemetics, pain control - Obtain GI pathogen panel, continue IV Zosyn  - Will likely need outpatient colonoscopy in 4-6 weeks, seen by surgery, no acute surgical needs  Active Problems: Lower GI bleed - Likely due to #1, H&H currently stable, will need outpatient colonoscopy once acute colitis is resolved    Hypertension - BP slightly elevated, placed on IV hydralazine with parameters    Hypokalemia - replaced     Syncope, vasovagal - Likely vasovagal due to #1, troponin negative, no EKG changes  Code Status: Full CODE STATUS DVT Prophylaxis:   SCD's Family Communication: Discussed in detail with the patient, all imaging results, lab results explained to the patient   Disposition  Plan:  Time Spent in minutes  25 minutes  Procedures:  CT abd   Consultants:   Surgery   Antimicrobials:   IV Zosyn  4/10   Medications  Scheduled Meds: . cycloSPORINE  1 drop Both Eyes BID   Continuous Infusions: . sodium chloride 1,000 mL (05/04/17 0027)  . piperacillin-tazobactam (ZOSYN)  IV Stopped (05/04/17 1025)   PRN Meds:.acetaminophen **OR** acetaminophen, albuterol, fentaNYL (SUBLIMAZE) injection, hydrALAZINE, ondansetron **OR** ondansetron (ZOFRAN) IV   Antibiotics   Anti-infectives (From admission, onward)   Start     Dose/Rate Route Frequency Ordered Stop   05/04/17 0600  piperacillin-tazobactam (ZOSYN) IVPB 3.375 g  Status:  Discontinued     3.375 g 100 mL/hr over 30 Minutes Intravenous Every 8 hours 05/03/17 2328 05/03/17 2338   05/04/17 0600  piperacillin-tazobactam (ZOSYN) IVPB 3.375 g     3.375 g 12.5 mL/hr over 240 Minutes Intravenous Every 8 hours 05/03/17 2338     05/03/17 2300  piperacillin-tazobactam (ZOSYN) IVPB 3.375 g     3.375 g 100 mL/hr over 30 Minutes Intravenous  Once 05/03/17 2255 05/04/17 0108        Subjective:   [name] was seen and examined today.  Feeling  somewhat better today, no vomiting, still has abd pain, improving. Patient denies dizziness, chest pain, shortness of breath, new weakness, numbess, tingling. No acute events overnight.    Objective:   Vitals:   05/04/17 0830 05/04/17 0900 05/04/17 0915 05/04/17 1000  BP: (!) 102/51 107/67 111/61 135/72  Pulse: 66 73 70 72  Resp: 16 12 13 17   Temp:      TempSrc:      SpO2: 92% 93% 94% 96%    Intake/Output Summary (Last 24 hours) at 05/04/2017 1049 Last data filed at 05/04/2017 1025 Gross per 24 hour  Intake 1600 ml  Output 300 ml  Net 1300 ml     Wt Readings from Last 3 Encounters:  12/25/15 65.8 kg (145 lb)  11/13/15 68.3 kg (150 lb 8 oz)  10/08/15 68.6 kg (151 lb 3.2 oz)     Exam  General: Alert and oriented x 3, NAD  Eyes:   HEENT:   Atraumatic, normocephalic,   Cardiovascular: S1 S2 auscultated. Regular rate and rhythm.  Respiratory: Clear to auscultation bilaterally, no wheezing, rales or rhonchi  Gastrointestinal: Soft, tender diffusely, worse in LLQ, nondistended, + bowel sounds  Ext: no pedal edema bilaterally  Neuro: AAOx3, Cr N's II- XII. Strength 5/5 upper and lower extremities bilaterally, Musculoskeletal: No digital cyanosis, clubbing  Skin: No rashes  Psych: Normal affect and demeanor, alert and oriented x3    Data Reviewed:  I have personally reviewed following labs and imaging studies  Micro Results No results found for this or any previous visit (from the past 240 hour(s)).  Radiology Reports Ct Abdomen Pelvis W Contrast  Result Date: 05/03/2017 CLINICAL DATA:  Left-sided abdominal pain with nausea, vomiting and diarrhea over the last week. EXAM: CT ABDOMEN AND PELVIS WITH CONTRAST TECHNIQUE: Multidetector CT imaging of the abdomen and pelvis was performed using the standard protocol following bolus administration of intravenous contrast. CONTRAST:  ISOVUE-300 IOPAMIDOL (ISOVUE-300) INJECTION 61% COMPARISON:  12/25/2015 FINDINGS: Lower chest: Negative Hepatobiliary: Liver parenchyma appears normal except for a benign cluster of calcifications in the right lobe. Gallbladder shows some layering stones and sludge. No CT evidence of cholecystitis or obstruction. Pancreas: Normal Spleen: Normal Adrenals/Urinary Tract: Adrenal glands are normal. Simple cysts of the kidneys. No acute renal finding. No obstruction. Bladder appears normal. Stomach/Bowel: No sign of obstruction or mass lesion. Patient does not have diverticulosis. The appendix is normal. However, there is a segment of the midportion of the descending colon that appears to show wall thickening. This extends over about 7 cm. This looks like a localized segment of colitis. Most likely etiology would be localized ischemic colitis. Minimal  surrounding edema in the fat. Vascular/Lymphatic: Advanced aortic atherosclerosis with calcified and soft plaque. No aneurysm. Celiac and superior mesenteric artery are patent. I cannot definitely identify patent inferior mesenteric artery. Reproductive: No pelvic mass.  Previous hysterectomy. Other: No free fluid or air. Musculoskeletal: Extensive chronic degenerative changes of the spine. IMPRESSION: Short segment of wall thickening of the mid descending colon extending over about 7 cm. In this patient without any visible diverticula an with advanced atherosclerotic disease of the aorta and nonvisualization of flow in the IMA, this probably represents a short segment of ischemic colitis. No advanced finding. There is only mild stranding in the surrounding fat. Gallstones and sludge dependent in the gallbladder but no CT evidence of cholecystitis or obstruction. Electronically Signed   By: Paulina Fusi M.D.   On: 05/03/2017 20:58    Lab Data:  CBC: Recent Labs  Lab 05/03/17 1848 05/04/17 0500  WBC 5.9 6.4  NEUTROABS 3.2  --   HGB 12.3 11.8*  HCT 36.1 35.2*  MCV 90.3 90.3  PLT 314 294   Basic Metabolic Panel: Recent Labs  Lab 05/03/17 1848 05/04/17 0054 05/04/17 0500  NA 135  --  138  K 3.0*  --  4.6  CL 101  --  103  CO2 25  --  28  GLUCOSE 106*  --  96  BUN 19  --  13  CREATININE 0.76  --  0.73  CALCIUM 9.3  --  8.7*  MG  --  1.8  --    GFR: CrCl cannot be calculated (Unknown ideal weight.). Liver Function Tests: Recent Labs  Lab 05/03/17 1848  AST 15  ALT 11*  ALKPHOS 45  BILITOT 0.3  PROT 7.0  ALBUMIN 3.8   Recent Labs  Lab 05/03/17 1848  LIPASE 31   No results for input(s): AMMONIA in the last 168 hours. Coagulation Profile: No results for input(s): INR, PROTIME in the last 168 hours. Cardiac Enzymes: No results for input(s): CKTOTAL, CKMB, CKMBINDEX, TROPONINI in the last 168 hours. BNP (last 3 results) No results for input(s): PROBNP in the last 8760  hours. HbA1C: No results for input(s): HGBA1C in the last 72 hours. CBG: No results for input(s): GLUCAP in the last 168 hours. Lipid Profile: No results for input(s): CHOL, HDL, LDLCALC, TRIG, CHOLHDL, LDLDIRECT in the last 72 hours. Thyroid Function Tests: No results for input(s): TSH, T4TOTAL, FREET4, T3FREE, THYROIDAB in the last 72 hours. Anemia Panel: No results for input(s): VITAMINB12, FOLATE, FERRITIN, TIBC, IRON, RETICCTPCT in the last 72 hours. Urine analysis:    Component Value Date/Time   COLORURINE STRAW (A) 05/03/2017 2254   APPEARANCEUR CLEAR 05/03/2017 2254   LABSPEC 1.021 05/03/2017 2254   PHURINE 6.0 05/03/2017 2254   GLUCOSEU NEGATIVE 05/03/2017 2254   HGBUR NEGATIVE 05/03/2017 2254   BILIRUBINUR NEGATIVE 05/03/2017 2254   KETONESUR NEGATIVE 05/03/2017 2254   PROTEINUR NEGATIVE 05/03/2017 2254   UROBILINOGEN 1.0 12/26/2007 0308   NITRITE NEGATIVE 05/03/2017 2254   LEUKOCYTESUR NEGATIVE 05/03/2017 2254     Jiali Linney M.D. [facility] 05/04/2017, 10:49 AM  Pager: [phone] Between 7am to 7pm - call Pager - [phone]  After 7pm go to [email or web address] - password [email or web address]  Call night coverage person covering after 7pm

## 2017-05-04 NOTE — ED Notes (Signed)
Downtime charting completed for downtime for 0100 to 0350.

## 2017-05-04 NOTE — ED Notes (Signed)
Pt ambulated to and from restroom with this RN.  

## 2017-05-05 DIAGNOSIS — K922 Gastrointestinal hemorrhage, unspecified: Secondary | ICD-10-CM

## 2017-05-05 LAB — COMPREHENSIVE METABOLIC PANEL
ALK PHOS: 41 U/L (ref 38–126)
ALT: 11 U/L — AB (ref 14–54)
AST: 15 U/L (ref 15–41)
Albumin: 3.4 g/dL — ABNORMAL LOW (ref 3.5–5.0)
Anion gap: 10 (ref 5–15)
BILIRUBIN TOTAL: 0.9 mg/dL (ref 0.3–1.2)
BUN: 9 mg/dL (ref 6–20)
CALCIUM: 8.6 mg/dL — AB (ref 8.9–10.3)
CO2: 23 mmol/L (ref 22–32)
CREATININE: 0.59 mg/dL (ref 0.44–1.00)
Chloride: 103 mmol/L (ref 101–111)
GFR calc non Af Amer: >60 mL/min (ref >60)
GLUCOSE: 86 mg/dL (ref 65–99)
Potassium: 3.2 mmol/L — ABNORMAL LOW (ref 3.5–5.1)
Sodium: 136 mmol/L (ref 135–145)
TOTAL PROTEIN: 5.9 g/dL — AB (ref 6.5–8.1)

## 2017-05-05 LAB — CBC
HCT: 35.4 % — ABNORMAL LOW (ref 36.0–46.0)
Hemoglobin: 12 g/dL (ref 12.0–15.0)
MCH: 30.6 pg (ref 26.0–34.0)
MCHC: 33.9 g/dL (ref 30.0–36.0)
MCV: 90.3 fL (ref 78.0–100.0)
Platelets: 301 10*3/uL (ref 150–400)
RBC: 3.92 MIL/uL (ref 3.87–5.11)
RDW: 12.6 % (ref 11.5–15.5)
WBC: 6.7 10*3/uL (ref 4.0–10.5)

## 2017-05-05 MED ORDER — PIPERACILLIN-TAZOBACTAM 3.375 G IVPB
3.3750 g | Freq: Three times a day (TID) | INTRAVENOUS | Status: DC
  Administered 2017-05-05 – 2017-05-07 (×6): 3.375 g via INTRAVENOUS
  Filled 2017-05-05 (×7): qty 50

## 2017-05-05 MED ORDER — POTASSIUM CHLORIDE CRYS ER 20 MEQ PO TBCR
20.0000 meq | EXTENDED_RELEASE_TABLET | Freq: Three times a day (TID) | ORAL | Status: AC
  Administered 2017-05-05 – 2017-05-06 (×4): 20 meq via ORAL
  Filled 2017-05-05 (×4): qty 1

## 2017-05-05 NOTE — Progress Notes (Addendum)
[facility] Progress Note     Subjective: CC: wants to eat Patient states she feels hungry and wants some soup or some coffee. She reports that she thinks she had some seafood that was bad prior to all these symptoms. Abdominal pain is improving. Reports she has not had a BM but is passing flatus. Denies n/v.  VSS.   Objective: Vital signs in last 24 hours: Temp:  [98 F (36.7 C)-98.2 F (36.8 C)] 98 F (36.7 C) (04/12 0537) Pulse Rate:  [71-86] 82 (04/12 0537) Resp:  [13-20] 20 (04/12 0537) BP: (107-179)/(67-110) 138/74 (04/12 0537) SpO2:  [95 %-100 %] 97 % (04/12 0537) Weight:  [66.7 kg (147 lb 0.8 oz)] 66.7 kg (147 lb 0.8 oz) (04/11 2156)    Intake/Output from previous day: 04/11 0701 - 04/12 0700 In: 3418.8 [I.V.:3318.8; IV Piggyback:100] Out: 850 [Urine:850] Intake/Output this shift: No intake/output data recorded.  PE: Gen:  Alert, NAD, pleasant Card:  Regular rate and rhythm, pedal pulses 2+ BL Pulm:  Normal effort, clear to auscultation bilaterally Abd: Soft, minimally TPP in LLQ, non-distended, bowel sounds present, no HSM Skin: warm and dry, no rashes  Psych: A&Ox3   Lab Results:  Recent Labs    05/04/17 0500 05/05/17 0626  WBC 6.4 6.7  HGB 11.8* 12.0  HCT 35.2* 35.4*  PLT 294 301   BMET Recent Labs    05/04/17 0500 05/05/17 0626  NA 138 136  K 4.6 3.2*  CL 103 103  CO2 28 23  GLUCOSE 96 86  BUN 13 9  CREATININE 0.73 0.59  CALCIUM 8.7* 8.6*   PT/INR No results for input(s): LABPROT, INR in the last 72 hours. CMP     Component Value Date/Time   NA 136 05/05/2017 0626   NA 137 09/07/2015   K 3.2 (L) 05/05/2017 0626   CL 103 05/05/2017 0626   CO2 23 05/05/2017 0626   GLUCOSE 86 05/05/2017 0626   BUN 9 05/05/2017 0626   BUN 16 09/07/2015   CREATININE 0.59 05/05/2017 0626   CALCIUM 8.6 (L) 05/05/2017 0626   PROT 5.9 (L) 05/05/2017 0626   ALBUMIN 3.4 (L) 05/05/2017 0626   AST 15 05/05/2017 0626   ALT 11 (L) 05/05/2017  0626   ALKPHOS 41 05/05/2017 0626   BILITOT 0.9 05/05/2017 0626   GFRNONAA >60 05/05/2017 0626   GFRAA >60 05/05/2017 0626   Lipase     Component Value Date/Time   LIPASE 31 05/03/2017 1848       Studies/Results: Ct Abdomen Pelvis W Contrast  Result Date: 05/03/2017 CLINICAL DATA:  Left-sided abdominal pain with nausea, vomiting and diarrhea over the last week. EXAM: CT ABDOMEN AND PELVIS WITH CONTRAST TECHNIQUE: Multidetector CT imaging of the abdomen and pelvis was performed using the standard protocol following bolus administration of intravenous contrast. CONTRAST:  100mL ISOVUE-300 IOPAMIDOL (ISOVUE-300) INJECTION 61% COMPARISON:  12/25/2015 FINDINGS: Lower chest: Negative Hepatobiliary: Liver parenchyma appears normal except for a benign cluster of calcifications in the right lobe. Gallbladder shows some layering stones and sludge. No CT evidence of cholecystitis or obstruction. Pancreas: Normal Spleen: Normal Adrenals/Urinary Tract: Adrenal glands are normal. Simple cysts of the kidneys. No acute renal finding. No obstruction. Bladder appears normal. Stomach/Bowel: No sign of obstruction or mass lesion. Patient does not have diverticulosis. The appendix is normal. However, there is a segment of the midportion of the descending colon that appears to show wall thickening. This extends over about 7 cm. This looks like a localized  segment of colitis. Most likely etiology would be localized ischemic colitis. Minimal surrounding edema in the fat. Vascular/Lymphatic: Advanced aortic atherosclerosis with calcified and soft plaque. No aneurysm. Celiac and superior mesenteric artery are patent. I cannot definitely identify patent inferior mesenteric artery. Reproductive: No pelvic mass.  Previous hysterectomy. Other: No free fluid or air. Musculoskeletal: Extensive chronic degenerative changes of the spine. IMPRESSION: Short segment of wall thickening of the mid descending colon extending over about 7  cm. In this patient without any visible diverticula an with advanced atherosclerotic disease of the aorta and nonvisualization of flow in the IMA, this probably represents a short segment of ischemic colitis. No advanced finding. There is only mild stranding in the surrounding fat. Gallstones and sludge dependent in the gallbladder but no CT evidence of cholecystitis or obstruction. Electronically Signed   By: Paulina Fusi M.D.   On: 05/03/2017 20:58    Anti-infectives: Anti-infectives (From admission, onward)   Start     Dose/Rate Route Frequency Ordered Stop   05/04/17 0600  piperacillin-tazobactam (ZOSYN) IVPB 3.375 g  Status:  Discontinued     3.375 g 100 mL/hr over 30 Minutes Intravenous Every 8 hours 05/03/17 2328 05/03/17 2338   05/04/17 0600  piperacillin-tazobactam (ZOSYN) IVPB 3.375 g     3.375 g 12.5 mL/hr over 240 Minutes Intravenous Every 8 hours 05/03/17 2338     05/03/17 2300  piperacillin-tazobactam (ZOSYN) IVPB 3.375 g     3.375 g 100 mL/hr over 30 Minutes Intravenous  Once 05/03/17 2255 05/04/17 0108       Assessment/Plan HTN HLD  Diverticulosis  Anxiety/depression Back pain  Colitis of descending colon  - possibly ischemic in origin. No acute surgical needs - vitals stable, hemodynamically stable, no leukocytosis, lactate is WNL, no peritonitis on exam.  - IVF, empiric abx, supportive therapy - may start CLD and ADAT - GI panel ordered but not collected - recommend GI consult for further recommendations, including if a follow up colonoscopy will be necessary.  Hypokalemia - 3.2 this AM - replace K PRN  FEN: CLD then ADAT. Replace K PO  VTE: SCDs ID: IV Zosyn 4/10>>  No surgical needs at this time. Abdominal exam is benign. Start CLD and advance as tolerated. Recommend GI consult for recommendations on medical management of colitis if needed. We will sign off, please call with questions or concerns or if new issues arise.    LOS: 2 days    Wells Guiles ,  [facility] 05/05/2017, 9:16 AM Pager: [phone] Consults: [phone] Mon-Fri 7:00 am-4:30 pm Sat-Sun 7:00 am-11:30 am

## 2017-05-05 NOTE — Progress Notes (Signed)
Per Dr. Selena BattenKim, ok to shower and monitor BP

## 2017-05-05 NOTE — Plan of Care (Signed)
Pt is free from pain and loose stools. Bed alarm activated. Pt is free from falls.

## 2017-05-05 NOTE — Discharge Instructions (Signed)
Colitis Colitis is inflammation of the colon. Colitis may last a short time (acute) or it may last a long time (chronic). What are the causes? This condition may be caused by:  Viruses.  Bacteria.  Reactions to medicine.  Certain autoimmune diseases, such as Crohn disease or ulcerative colitis.  What are the signs or symptoms? Symptoms of this condition include:  Diarrhea.  Passing bloody or tarry stool.  Pain.  Fever.  Vomiting.  Tiredness (fatigue).  Weight loss.  Bloating.  Sudden increase in abdominal pain.  Having fewer bowel movements than usual.  How is this diagnosed? This condition is diagnosed with a stool test or a blood test. You may also have other tests, including X-rays, a CT scan, or a colonoscopy. How is this treated? Treatment may include:  Resting the bowel. This involves not eating or drinking for a period of time.  Fluids that are given through an IV tube.  Medicine for pain and diarrhea.  Antibiotic medicines.  Cortisone medicines.  Surgery.  Follow these instructions at home: Eating and drinking  Follow instructions from your health care provider about eating or drinking restrictions.  Drink enough fluid to keep your urine clear or pale yellow.  Work with a dietitian to determine which foods cause your condition to flare up.  Avoid foods that cause flare-ups.  Eat a well-balanced diet. Medicines  Take over-the-counter and prescription medicines only as told by your health care provider.  If you were prescribed an antibiotic medicine, take it as told by your health care provider. Do not stop taking the antibiotic even if you start to feel better. General instructions  Keep all follow-up visits as told by your health care provider. This is important. Contact a health care provider if:  Your symptoms do not go away.  You develop new symptoms. Get help right away if:  You have a fever that does not go away with  treatment.  You develop chills.  You have extreme weakness, fainting, or dehydration.  You have repeated vomiting.  You develop severe pain in your abdomen.  You pass bloody or tarry stool. This information is not intended to replace advice given to you by your health care provider. Make sure you discuss any questions you have with your health care provider. Document Released: 02/18/2004 Document Revised: 06/18/2015 Document Reviewed: 05/05/2014 Elsevier Interactive Patient Education  2018 [employer] Inc.  

## 2017-05-05 NOTE — Progress Notes (Signed)
Patient ID: [name], female   DOB: 31-Jul-1932, 84 y.o.   MRN: [id number]                                                                PROGRESS NOTE                                                                                                                                                                                                             Patient Demographics:    [name], is a 84 y.o. female, DOB - May 07, 1932, ONG:[id number]  Admit date - 05/03/2017   Admitting Physician Clydie Braun, MD  Outpatient Primary MD for the patient is Aida Puffer, MD  LOS - 2  Outpatient Specialists:     Chief Complaint  Patient presents with  . Abdominal Pain       Brief Narrative  84 y.o. female with medical history significant of HTN, HLD, diverticulosis, and anxiety/depression; who presents with complaints of lower abdominal pain over the last 6 days.  Patient states that the pain is constant, severe, and worse on the left lower quadrant. Associated symptoms included fever, chills,  generalized weakness, nausea, vomiting, and diarrhea.  Diarrhea was initially noted to have bright red blood present in it, but she reports that that is since improved.  Patient initially thought symptoms were related to possible food poisoning.  She reports that she had not been eating or drinking very much due to the symptoms. She reports passing out at some point prior to arrival.  Denies having any chest pain, shortness of breath, cough, dysuria, or recent antibiotic use.  Family have been urging her to come in but she had declined until today as she was not getting better.    ED Course: Upon admission into the emergency department patient was seen to be afebrile, blood pressure 121/82-162/95, and all other vital signs relatively within normal limits.  Labs revealed potassium of 3 and was otherwise appeared within normal limits.  CT scan showed a 7 cm segment of descending colon thickening suggestive of  ischemic colitis. Patient was started on Zosyn.  Dr. Lindie Spruce of general surgery was consulted.     Subjective:    [name] today still has some abdominal pain, mostly right lower per pt. But this has  improved since admission.  Denies fever, chills, n/v, diarrhea, brbpr. Pt still NPO   No headache, No chest pain,  No new weakness tingling or numbness, No Cough - SOB.    Assessment  & Plan :    Principal Problem:   Colitis Active Problems:   Hypertension   Hypokalemia   Syncope, vasovagal   Nausea, vomiting, and diarrhea   Melena   Acute Colitis: Ischemic versus infectious. Presented with nausea, vomiting, abdominal pain, diarrhea - Continue IV fluid hydration, NPO, antiemetics, pain control - Obtain GI pathogen panel (pending), continue IV Zosyn  - Will likely need outpatient colonoscopy in 4-6 weeks, seen by surgery, no acute surgical needs - defer to surgery regarding advancing diet   Lower GI bleed - Likely due to #1, H&H currently stable, will need outpatient colonoscopy once acute colitis is resolved    Hypertension - BP slightly elevated, placed on IV hydralazine with parameters    Hypokalemia - replaced     Syncope, vasovagal - Likely vasovagal due to #1, troponin negative, no EKG changes  Code Status: Full CODE STATUS DVT Prophylaxis:   SCD's Family Communication: Discussed in detail with the patient, all imaging results, lab results explained to the patient   Disposition Plan:  Home      Lab Results  Component Value Date   PLT 294 05/04/2017    Antibiotics  :  Zosyn iv 4/10=>  Anti-infectives (From admission, onward)   Start     Dose/Rate Route Frequency Ordered Stop   05/04/17 0600  piperacillin-tazobactam (ZOSYN) IVPB 3.375 g  Status:  Discontinued     3.375 g 100 mL/hr over 30 Minutes Intravenous Every 8 hours 05/03/17 2328 05/03/17 2338   05/04/17 0600  piperacillin-tazobactam (ZOSYN) IVPB 3.375 g     3.375 g 12.5 mL/hr over 240  Minutes Intravenous Every 8 hours 05/03/17 2338     05/03/17 2300  piperacillin-tazobactam (ZOSYN) IVPB 3.375 g     3.375 g 100 mL/hr over 30 Minutes Intravenous  Once 05/03/17 2255 05/04/17 0108        Objective:   Vitals:   05/04/17 2156 05/04/17 2202 05/05/17 0006 05/05/17 0537  BP:  (!) 179/102 124/82 138/74  Pulse:  84 85 82  Resp:  20  20  Temp:  98.2 F (36.8 C)  98 F (36.7 C)  TempSrc:  Oral    SpO2:  100%  97%  Weight: 66.7 kg (147 lb 0.8 oz)     Height: 5\' 3"  (1.6 m)       Wt Readings from Last 3 Encounters:  05/04/17 66.7 kg (147 lb 0.8 oz)  12/25/15 65.8 kg (145 lb)  11/13/15 68.3 kg (150 lb 8 oz)     Intake/Output Summary (Last 24 hours) at 05/05/2017 0705 Last data filed at 05/05/2017 0412 Gross per 24 hour  Intake 3418.75 ml  Output 850 ml  Net 2568.75 ml     Physical Exam  Awake Alert, Oriented X 3, No new F.N deficits, Normal affect [place].AT,PERRAL Supple Neck,No JVD, No cervical lymphadenopathy appriciated.  Symmetrical Chest wall movement, Good air movement bilaterally, CTAB RRR,No Gallops,Rubs or new Murmurs, No Parasternal Heave +ve B.Sounds, Abd Soft, No tenderness, No organomegaly appriciated, No rebound - guarding or rigidity. No Cyanosis, Clubbing or edema, No new Rash or bruise      Data Review:    CBC Recent Labs  Lab 05/03/17 1848 05/04/17 0500  WBC 5.9 6.4  HGB 12.3 11.8*  HCT 36.1 35.2*  PLT 314 294  MCV 90.3 90.3  MCH 30.8 30.3  MCHC 34.1 33.5  RDW 12.4 12.6  LYMPHSABS 2.0  --   MONOABS 0.4  --   EOSABS 0.3  --   BASOSABS 0.0  --     Chemistries  Recent Labs  Lab 05/03/17 1848 05/04/17 0054 05/04/17 0500  NA 135  --  138  K 3.0*  --  4.6  CL 101  --  103  CO2 25  --  28  GLUCOSE 106*  --  96  BUN 19  --  13  CREATININE 0.76  --  0.73  CALCIUM 9.3  --  8.7*  MG  --  1.8  --   AST 15  --   --   ALT 11*  --   --   ALKPHOS 45  --   --   BILITOT 0.3  --   --     ------------------------------------------------------------------------------------------------------------------ No results for input(s): CHOL, HDL, LDLCALC, TRIG, CHOLHDL, LDLDIRECT in the last 72 hours.  Lab Results  Component Value Date   HGBA1C 5.8 09/07/2015   ------------------------------------------------------------------------------------------------------------------ No results for input(s): TSH, T4TOTAL, T3FREE, THYROIDAB in the last 72 hours.  Invalid input(s): FREET3 ------------------------------------------------------------------------------------------------------------------ No results for input(s): VITAMINB12, FOLATE, FERRITIN, TIBC, IRON, RETICCTPCT in the last 72 hours.  Coagulation profile No results for input(s): INR, PROTIME in the last 168 hours.  No results for input(s): DDIMER in the last 72 hours.  Cardiac Enzymes No results for input(s): CKMB, TROPONINI, MYOGLOBIN in the last 168 hours.  Invalid input(s): CK ------------------------------------------------------------------------------------------------------------------ No results found for: BNP  Inpatient Medications  Scheduled Meds: . cycloSPORINE  1 drop Both Eyes BID   Continuous Infusions: . sodium chloride 1,000 mL (05/04/17 0027)  . piperacillin-tazobactam (ZOSYN)  IV 3.375 g (05/05/17 0513)   PRN Meds:.acetaminophen **OR** acetaminophen, albuterol, fentaNYL (SUBLIMAZE) injection, hydrALAZINE, ondansetron **OR** ondansetron (ZOFRAN) IV, oxyCODONE  Micro Results No results found for this or any previous visit (from the past 240 hour(s)).  Radiology Reports Ct Abdomen Pelvis W Contrast  Result Date: 05/03/2017 CLINICAL DATA:  Left-sided abdominal pain with nausea, vomiting and diarrhea over the last week. EXAM: CT ABDOMEN AND PELVIS WITH CONTRAST TECHNIQUE: Multidetector CT imaging of the abdomen and pelvis was performed using the standard protocol following bolus administration of  intravenous contrast. CONTRAST:  ISOVUE-300 IOPAMIDOL (ISOVUE-300) INJECTION 61% COMPARISON:  12/25/2015 FINDINGS: Lower chest: Negative Hepatobiliary: Liver parenchyma appears normal except for a benign cluster of calcifications in the right lobe. Gallbladder shows some layering stones and sludge. No CT evidence of cholecystitis or obstruction. Pancreas: Normal Spleen: Normal Adrenals/Urinary Tract: Adrenal glands are normal. Simple cysts of the kidneys. No acute renal finding. No obstruction. Bladder appears normal. Stomach/Bowel: No sign of obstruction or mass lesion. Patient does not have diverticulosis. The appendix is normal. However, there is a segment of the midportion of the descending colon that appears to show wall thickening. This extends over about 7 cm. This looks like a localized segment of colitis. Most likely etiology would be localized ischemic colitis. Minimal surrounding edema in the fat. Vascular/Lymphatic: Advanced aortic atherosclerosis with calcified and soft plaque. No aneurysm. Celiac and superior mesenteric artery are patent. I cannot definitely identify patent inferior mesenteric artery. Reproductive: No pelvic mass.  Previous hysterectomy. Other: No free fluid or air. Musculoskeletal: Extensive chronic degenerative changes of the spine. IMPRESSION: Short segment of wall thickening of the mid descending colon extending over about 7 cm. In this patient without  any visible diverticula an with advanced atherosclerotic disease of the aorta and nonvisualization of flow in the IMA, this probably represents a short segment of ischemic colitis. No advanced finding. There is only mild stranding in the surrounding fat. Gallstones and sludge dependent in the gallbladder but no CT evidence of cholecystitis or obstruction. Electronically Signed   By: Paulina Fusi M.D.   On: 05/03/2017 20:58    Time Spent in minutes  30   Pearson Grippe M.D on 05/05/2017 at 7:05 AM  Between 7am to 7pm - Pager -  [phone]    After 7pm go to [email or web address] - password [facility]  [facility] -  Office  [phone]

## 2017-05-06 LAB — COMPREHENSIVE METABOLIC PANEL
ALBUMIN: 3.3 g/dL — AB (ref 3.5–5.0)
ALT: 13 U/L — ABNORMAL LOW (ref 14–54)
ANION GAP: 8 (ref 5–15)
AST: 16 U/L (ref 15–41)
Alkaline Phosphatase: 40 U/L (ref 38–126)
BUN: 7 mg/dL (ref 6–20)
CHLORIDE: 107 mmol/L (ref 101–111)
CO2: 22 mmol/L (ref 22–32)
Calcium: 8.8 mg/dL — ABNORMAL LOW (ref 8.9–10.3)
Creatinine, Ser: 0.67 mg/dL (ref 0.44–1.00)
GFR calc non Af Amer: >60 mL/min (ref >60)
GLUCOSE: 109 mg/dL — AB (ref 65–99)
Potassium: 3.9 mmol/L (ref 3.5–5.1)
SODIUM: 137 mmol/L (ref 135–145)
Total Bilirubin: 0.8 mg/dL (ref 0.3–1.2)
Total Protein: 5.9 g/dL — ABNORMAL LOW (ref 6.5–8.1)

## 2017-05-06 LAB — CBC
HCT: 35 % — ABNORMAL LOW (ref 36.0–46.0)
Hemoglobin: 12.1 g/dL (ref 12.0–15.0)
MCH: 30.6 pg (ref 26.0–34.0)
MCHC: 34.6 g/dL (ref 30.0–36.0)
MCV: 88.4 fL (ref 78.0–100.0)
PLATELETS: 287 10*3/uL (ref 150–400)
RBC: 3.96 MIL/uL (ref 3.87–5.11)
RDW: 12.3 % (ref 11.5–15.5)
WBC: 6.1 10*3/uL (ref 4.0–10.5)

## 2017-05-06 MED ORDER — LOSARTAN POTASSIUM 50 MG PO TABS
50.0000 mg | ORAL_TABLET | Freq: Every day | ORAL | Status: DC
  Administered 2017-05-06 – 2017-05-07 (×2): 50 mg via ORAL
  Filled 2017-05-06 (×2): qty 1

## 2017-05-06 MED ORDER — CLONAZEPAM 0.125 MG PO TBDP
0.2500 mg | ORAL_TABLET | Freq: Two times a day (BID) | ORAL | Status: DC
  Administered 2017-05-06 – 2017-05-07 (×3): 0.25 mg via ORAL
  Filled 2017-05-06 (×3): qty 2

## 2017-05-06 MED ORDER — AMLODIPINE BESYLATE 5 MG PO TABS
5.0000 mg | ORAL_TABLET | Freq: Every day | ORAL | Status: DC
  Administered 2017-05-06 – 2017-05-07 (×2): 5 mg via ORAL
  Filled 2017-05-06 (×2): qty 1

## 2017-05-06 MED ORDER — SERTRALINE HCL 50 MG PO TABS
50.0000 mg | ORAL_TABLET | Freq: Every day | ORAL | Status: DC
  Administered 2017-05-06 – 2017-05-07 (×2): 50 mg via ORAL
  Filled 2017-05-06 (×2): qty 1

## 2017-05-06 NOTE — Progress Notes (Signed)
PROGRESS NOTE    [name]  YNW:[id number] DOB: 1932-10-15 DOA: 05/03/2017 PCP: Aida Puffer, MD   Brief Narrative:84 y.o.femalewith medical history significant ofHTN, HLD, diverticulosis, and anxiety/depression;who presents with complaints of lower abdominal pain over the last 6 days. Patient states that the pain is constant, severe, and worse on the left lower quadrant. Associated symptoms included fever, chills, generalized weakness,nausea, vomiting, and diarrhea. Diarrhea was initially noted to have bright red blood present in it, but she reports that that is since improved. Patient initially thought symptoms were related to possible food poisoning. She reports that she had not been eating or drinking very much due to the symptoms.She reports passing out at some point prior to arrival. Denies having any chest pain, shortness of breath, cough, dysuria, or recent antibiotic use. Family have been urging her to come in but she had declined until today as she was not getting better.   ED Course:Upon admission into the emergency department patient was seen to be afebrile, blood pressure 121/82-162/95, and all other vital signs relatively within normal limits. Labs revealed potassium of 3 and was otherwise appeared within normal limits. CT scan showed a 7 cm segment of descending colon thickening suggestive of ischemic colitis. Patient was started on Zosyn. Dr. Sandre Kitty general surgery was consulted.  4/13- No vomiting.has abdominal pain.passing flatus.    Assessment & Plan:   Principal Problem:   Colitis Active Problems:   Hypertension   Hypokalemia   Syncope, vasovagal   Nausea, vomiting, and diarrhea   Melena   Lower GI bleed  1]Ischemic colitis-advance diet as tolerated.she has not had any vomiting since last pm.she ate last night.does have some abdominal discomfort.i do not see gi pathogen send.dc zosyn.  2]s/p vasovagal syncope resolved  3]HTN restart home  meds.  4]Hypokalemia-resolved  5]anxiety takes klonopin bid.restart.    DVT prophylaxis:scd Code Status full Family Communication:none Disposition Plan: Once able to tolerate po intake.will order PT. Consultants:   Procedures:  Antimicrobials:  Subjective:   Objective: Vitals:   05/05/17 0537 05/05/17 1408 05/05/17 2141 05/06/17 0618  BP: 138/74 (!) 161/91 140/82 (!) 153/84  Pulse: 82 95 81 89  Resp: 20  18 18   Temp: 98 F (36.7 C) 98.2 F (36.8 C) 98.2 F (36.8 C) (!) 97.3 F (36.3 C)  TempSrc:  Oral Oral Oral  SpO2: 97% 98% 100% 97%  Weight:      Height:        Intake/Output Summary (Last 24 hours) at 05/06/2017 1300 Last data filed at 05/06/2017 1138 Gross per 24 hour  Intake 3668.75 ml  Output 1450 ml  Net 2218.75 ml   Filed Weights   05/04/17 2156  Weight: 66.7 kg (147 lb 0.8 oz)    Examination:  General exam: Appears calm and comfortable  Respiratory system: Clear to auscultation. Respiratory effort normal. Cardiovascular system: S1 & S2 heard, RRR. No JVD, murmurs, rubs, gallops or clicks. No pedal edema. Gastrointestinal system: Abdomen is nondistended, soft and nontender. No organomegaly or masses felt. Normal bowel sounds heard. Central nervous system: Alert and oriented. No focal neurological deficits. Extremities: Symmetric 5 x 5 power. Skin: No rashes, lesions or ulcers Psychiatry: Judgement and insight appear normal. Mood & affect appropriate.     Data Reviewed: I have personally reviewed following labs and imaging studies  CBC: Recent Labs  Lab 05/03/17 1848 05/04/17 0500 05/05/17 0626 05/06/17 0636  WBC 5.9 6.4 6.7 6.1  NEUTROABS 3.2  --   --   --  HGB 12.3 11.8* 12.0 12.1  HCT 36.1 35.2* 35.4* 35.0*  MCV 90.3 90.3 90.3 88.4  PLT 314 294 301 287   Basic Metabolic Panel: Recent Labs  Lab 05/03/17 1848 05/04/17 0054 05/04/17 0500 05/05/17 0626 05/06/17 0636  NA 135  --  138 136 137  K 3.0*  --  4.6 3.2* 3.9  CL 101   --  103 103 107  CO2 25  --  28 23 22   GLUCOSE 106*  --  96 86 109*  BUN 19  --  13 9 7   CREATININE 0.76  --  0.73 0.59 0.67  CALCIUM 9.3  --  8.7* 8.6* 8.8*  MG  --  1.8  --   --   --    GFR: Estimated Creatinine Clearance: 48.9 mL/min (by C-G formula based on SCr of 0.67 mg/dL). Liver Function Tests: Recent Labs  Lab 05/03/17 1848 05/05/17 0626 05/06/17 0636  AST 15 15 16   ALT 11* 11* 13*  ALKPHOS 45 41 40  BILITOT 0.3 0.9 0.8  PROT 7.0 5.9* 5.9*  ALBUMIN 3.8 3.4* 3.3*   Recent Labs  Lab 05/03/17 1848  LIPASE 31   No results for input(s): AMMONIA in the last 168 hours. Coagulation Profile: No results for input(s): INR, PROTIME in the last 168 hours. Cardiac Enzymes: No results for input(s): CKTOTAL, CKMB, CKMBINDEX, TROPONINI in the last 168 hours. BNP (last 3 results) No results for input(s): PROBNP in the last 8760 hours. HbA1C: No results for input(s): HGBA1C in the last 72 hours. CBG: No results for input(s): GLUCAP in the last 168 hours. Lipid Profile: No results for input(s): CHOL, HDL, LDLCALC, TRIG, CHOLHDL, LDLDIRECT in the last 72 hours. Thyroid Function Tests: No results for input(s): TSH, T4TOTAL, FREET4, T3FREE, THYROIDAB in the last 72 hours. Anemia Panel: No results for input(s): VITAMINB12, FOLATE, FERRITIN, TIBC, IRON, RETICCTPCT in the last 72 hours. Sepsis Labs: Recent Labs  Lab 05/03/17 1902  LATICACIDVEN 0.81    No results found for this or any previous visit (from the past 240 hour(s)).       Radiology Studies: No results found.      Scheduled Meds: . cycloSPORINE  1 drop Both Eyes BID   Continuous Infusions: . sodium chloride 1,000 mL (05/05/17 2353)  . piperacillin-tazobactam (ZOSYN)  IV 3.375 g (05/06/17 0949)     LOS: 3 days     Alwyn RenElizabeth G Mathews, MD [email or web address] Password [email or web address] 05/06/2017, 1:00 PM

## 2017-05-06 NOTE — Plan of Care (Signed)
Progressing

## 2017-05-07 LAB — COMPREHENSIVE METABOLIC PANEL
ALBUMIN: 3.1 g/dL — AB (ref 3.5–5.0)
ALK PHOS: 36 U/L — AB (ref 38–126)
ALT: 12 U/L — ABNORMAL LOW (ref 14–54)
ANION GAP: 8 (ref 5–15)
AST: 15 U/L (ref 15–41)
BILIRUBIN TOTAL: 0.7 mg/dL (ref 0.3–1.2)
BUN: 5 mg/dL — ABNORMAL LOW (ref 6–20)
CALCIUM: 8.7 mg/dL — AB (ref 8.9–10.3)
CO2: 23 mmol/L (ref 22–32)
Chloride: 105 mmol/L (ref 101–111)
Creatinine, Ser: 0.67 mg/dL (ref 0.44–1.00)
GFR calc Af Amer: >60 mL/min (ref >60)
GLUCOSE: 100 mg/dL — AB (ref 65–99)
Potassium: 3.5 mmol/L (ref 3.5–5.1)
Sodium: 136 mmol/L (ref 135–145)
TOTAL PROTEIN: 5.7 g/dL — AB (ref 6.5–8.1)

## 2017-05-07 LAB — CBC WITH DIFFERENTIAL/PLATELET
Basophils Absolute: 0 10*3/uL (ref 0.0–0.1)
Basophils Relative: 1 %
Eosinophils Absolute: 0.4 10*3/uL (ref 0.0–0.7)
Eosinophils Relative: 7 %
HEMATOCRIT: 34.2 % — AB (ref 36.0–46.0)
Hemoglobin: 11.6 g/dL — ABNORMAL LOW (ref 12.0–15.0)
LYMPHS ABS: 2.2 10*3/uL (ref 0.7–4.0)
LYMPHS PCT: 37 %
MCH: 30.5 pg (ref 26.0–34.0)
MCHC: 33.9 g/dL (ref 30.0–36.0)
MCV: 90 fL (ref 78.0–100.0)
MONOS PCT: 6 %
Monocytes Absolute: 0.4 10*3/uL (ref 0.1–1.0)
NEUTROS ABS: 2.9 10*3/uL (ref 1.7–7.7)
NEUTROS PCT: 49 %
Platelets: 305 10*3/uL (ref 150–400)
RBC: 3.8 MIL/uL — ABNORMAL LOW (ref 3.87–5.11)
RDW: 12.6 % (ref 11.5–15.5)
WBC: 6 10*3/uL (ref 4.0–10.5)

## 2017-05-07 MED ORDER — TRAMADOL HCL 50 MG PO TABS: 50.0000 mg | ORAL_TABLET | Freq: Three times a day (TID) | ORAL | 0 refills | Status: AC | PRN

## 2017-05-07 MED ORDER — AMOXICILLIN-POT CLAVULANATE 875-125 MG PO TABS: 1.0000 | ORAL_TABLET | Freq: Two times a day (BID) | ORAL | 0 refills | Status: DC

## 2017-05-07 MED ORDER — ONDANSETRON 4 MG PO TBDP: 4.0000 mg | ORAL_TABLET | Freq: Three times a day (TID) | ORAL | 0 refills | Status: DC | PRN

## 2017-05-07 NOTE — Progress Notes (Signed)
Nsg Discharge Note  Admit Date:  05/03/2017 Discharge date: 05/07/2017   Chuck HintJoy S Oberry to be D/C'd home with daughter per MD order.  AVS completed.  Copy for chart, and copy for patient signed, and dated. Patient/caregiver able to verbalize understanding.  Discharge Medication: Allergies as of 05/07/2017      Reactions   Escitalopram Diarrhea   Meloxicam Other (See Comments)   insomnia      Medication List    STOP taking these medications   lisinopril-hydrochlorothiazide 20-25 MG tablet Commonly known as:  PRINZIDE,ZESTORETIC     TAKE these medications   acetaminophen 650 MG CR tablet Commonly known as:  TYLENOL Take 650 mg by mouth daily.   albuterol (2.5 MG/3ML) 0.083% nebulizer solution Commonly known as:  PROVENTIL Take 2.5 mg by nebulization every 6 (six) hours as needed for wheezing or shortness of breath.   amLODipine 5 MG tablet Commonly known as:  NORVASC Take 1 tablet (5 mg total) by mouth daily.   amoxicillin-clavulanate 875-125 MG tablet Commonly known as:  AUGMENTIN Take 1 tablet by mouth 2 (two) times daily. X 10 DAYS   clonazePAM 0.25 MG disintegrating tablet Commonly known as:  KLONOPIN Take 0.25 mg by mouth 2 (two) times daily.   cycloSPORINE 0.05 % ophthalmic emulsion Commonly known as:  RESTASIS Place 1 drop into both eyes 2 (two) times daily.   HYDROcodone-acetaminophen 5-325 MG tablet Commonly known as:  NORCO/VICODIN Take 1 tablet by mouth 3 (three) times daily as needed.   losartan 50 MG tablet Commonly known as:  COZAAR Take 50 mg by mouth daily.   ondansetron 4 MG disintegrating tablet Commonly known as:  ZOFRAN ODT Take 1 tablet (4 mg total) by mouth every 8 (eight) hours as needed for nausea or vomiting.   sertraline 50 MG tablet Commonly known as:  ZOLOFT Take 50 mg by mouth daily.   traMADol 50 MG tablet Commonly known as:  ULTRAM Take 1 tablet (50 mg total) by mouth every 8 (eight) hours as needed for moderate pain.        Discharge Assessment: Vitals:   05/06/17 2154 05/07/17 0500  BP: 126/89 116/69  Pulse: (!) 113 72  Resp: 18 18  Temp: 98.1 F (36.7 C) 98.2 F (36.8 C)  SpO2: 96% 95%   Skin clean, dry and intact without evidence of skin break down, no evidence of skin tears noted. IV catheter discontinued intact. Site without signs and symptoms of complications - no redness or edema noted at insertion site, patient denies c/o pain - only slight tenderness at site.  Dressing with slight pressure applied.  D/c Instructions-Education: Discharge instructions given to patient/family with verbalized understanding. D/c education completed with patient/family including follow up instructions, medication list, prescriptions, d/c activities limitations if indicated, with other d/c instructions as indicated by MD - patient able to verbalize understanding, all questions fully answered. Patient instructed to return to ED, call 911, or call MD for any changes in condition.  Patient escorted via WC, and D/C home via private auto.  Tacey HeapElisha Epperson, RN 05/07/2017 3:26 PM

## 2017-05-07 NOTE — Discharge Summary (Signed)
Physician Discharge Summary   Patient ID: [name] MRN: [id number] DOB/AGE: 1933/01/15 84 y.o.  Admit date: 05/03/2017 Discharge date: 05/07/2017  Primary Care Physician:  Aida Puffer, MD   Recommendations for Outpatient Follow-up:  1. Follow up with PCP in 1-2 weeks 2. Please obtain BMP/CBC in one week  Home Health: None  Equipment/Devices: None  Discharge Condition: stable  CODE STATUS: FULL  Diet recommendation: Soft diet   Discharge Diagnoses:    . Acute ischemic colitis . Hypokalemia . Syncope, vasovagal . Hypertension . Nausea, vomiting, and diarrhea likely due to colitis    Consults: None    Allergies:   Allergies  Allergen Reactions  . Escitalopram Diarrhea  . Meloxicam Other (See Comments)    insomnia     DISCHARGE MEDICATIONS: Allergies as of 05/07/2017      Reactions   Escitalopram Diarrhea   Meloxicam Other (See Comments)   insomnia      Medication List    STOP taking these medications   lisinopril-hydrochlorothiazide 20-25 MG tablet Commonly known as:  PRINZIDE,ZESTORETIC     TAKE these medications   acetaminophen 650 MG CR tablet Commonly known as:  TYLENOL Take 650 mg by mouth daily.   albuterol (2.5 MG/3ML) 0.083% nebulizer solution Commonly known as:  PROVENTIL Take 2.5 mg by nebulization every 6 (six) hours as needed for wheezing or shortness of breath.   amLODipine 5 MG tablet Commonly known as:  NORVASC Take 1 tablet (5 mg total) by mouth daily.   amoxicillin-clavulanate 875-125 MG tablet Commonly known as:  AUGMENTIN Take 1 tablet by mouth 2 (two) times daily. X 10 DAYS   clonazePAM 0.25 MG disintegrating tablet Commonly known as:  KLONOPIN Take 0.25 mg by mouth 2 (two) times daily.   cycloSPORINE 0.05 % ophthalmic emulsion Commonly known as:  RESTASIS Place 1 drop into both eyes 2 (two) times daily.   HYDROcodone-acetaminophen 5-325 MG tablet Commonly known as:  NORCO/VICODIN Take 1 tablet by mouth 3  (three) times daily as needed.   losartan 50 MG tablet Commonly known as:  COZAAR Take 50 mg by mouth daily.   ondansetron 4 MG disintegrating tablet Commonly known as:  ZOFRAN ODT Take 1 tablet (4 mg total) by mouth every 8 (eight) hours as needed for nausea or vomiting.   sertraline 50 MG tablet Commonly known as:  ZOLOFT Take 50 mg by mouth daily.   traMADol 50 MG tablet Commonly known as:  ULTRAM Take 1 tablet (50 mg total) by mouth every 8 (eight) hours as needed for moderate pain.        Brief H and P: For complete details please refer to admission H and P, but in [phone] y.o.femalewith medical history significant ofHTN, HLD, diverticulosis, and anxiety/depression;who presents with complaints of lower abdominal pain over the last 6 days. Patient states that the pain is constant, severe, and worse on the left lower quadrant. Associated symptoms included fever, chills, generalized weakness,nausea, vomiting, and diarrhea. Diarrhea was initially noted to have bright red blood present in it, but she reports that that is since improved. Patient initially thought symptoms were related to possible food poisoning. She reports that she had not been eating or drinking very much due to the symptoms.She reports passing out at some point prior to arrival. CT scan of the abdomen showed 7 cm segment of descending colon thickening suggestive of ischemic colitis.  Patient was placed on Zosyn and admitted for further workup.    Hospital Course:  Acute ischemic colitis:  -Also possibly infectious component as patient reported that her symptoms started after eating out -She was placed on clear liquids, IV fluid hydration, IV Zosyn -Currently doing well, no fevers or leukocytosis, tolerating solid diet without any difficulty - Patient will need outpatient colonoscopy in 4-6 weeks, explained to the patient  Vasovagal syncope Likely due to nausea, vomiting, diarrhea,  dehydration,-symptoms resolved  Hypertension BP improving, continue amlodipine, may restart losartan - Discontinue lisinopril, HCTZ     Hypokalemia -Replaced    Nausea, vomiting, and diarrhea, with lower GI bleed -Patient reported that initially she had noticed bright red blood per rectum, which resolved.  Likely symptoms due to acute ischemic colitis.  Day of Discharge S: Doing well, eager to go home, no fevers, chills, nausea vomiting or abdominal pain.  BP 116/69 (BP Location: Left Arm)   Pulse 72   Temp 98.2 F (36.8 C) (Oral)   Resp 18   Ht 5\' 3"  (1.6 m)   Wt 66.7 kg (147 lb 0.8 oz)   SpO2 95%   BMI 26.05 kg/m   Physical Exam: General: Alert and awake oriented x3 not in any acute distress. HEENT: anicteric sclera, pupils reactive to light and accommodation CVS: S1-S2 clear no murmur rubs or gallops Chest: clear to auscultation bilaterally, no wheezing rales or rhonchi Abdomen: soft nontender, nondistended, normal bowel sounds Extremities: no cyanosis, clubbing or edema noted bilaterally Neuro: Cranial nerves II-XII intact, no focal neurological deficits   The results of significant diagnostics from this hospitalization (including imaging, microbiology, ancillary and laboratory) are listed below for reference.      Procedures/Studies:  Ct Abdomen Pelvis W Contrast  Result Date: 05/03/2017 CLINICAL DATA:  Left-sided abdominal pain with nausea, vomiting and diarrhea over the last week. EXAM: CT ABDOMEN AND PELVIS WITH CONTRAST TECHNIQUE: Multidetector CT imaging of the abdomen and pelvis was performed using the standard protocol following bolus administration of intravenous contrast. CONTRAST:  100mL ISOVUE-300 IOPAMIDOL (ISOVUE-300) INJECTION 61% COMPARISON:  12/25/2015 FINDINGS: Lower chest: Negative Hepatobiliary: Liver parenchyma appears normal except for a benign cluster of calcifications in the right lobe. Gallbladder shows some layering stones and sludge. No CT  evidence of cholecystitis or obstruction. Pancreas: Normal Spleen: Normal Adrenals/Urinary Tract: Adrenal glands are normal. Simple cysts of the kidneys. No acute renal finding. No obstruction. Bladder appears normal. Stomach/Bowel: No sign of obstruction or mass lesion. Patient does not have diverticulosis. The appendix is normal. However, there is a segment of the midportion of the descending colon that appears to show wall thickening. This extends over about 7 cm. This looks like a localized segment of colitis. Most likely etiology would be localized ischemic colitis. Minimal surrounding edema in the fat. Vascular/Lymphatic: Advanced aortic atherosclerosis with calcified and soft plaque. No aneurysm. Celiac and superior mesenteric artery are patent. I cannot definitely identify patent inferior mesenteric artery. Reproductive: No pelvic mass.  Previous hysterectomy. Other: No free fluid or air. Musculoskeletal: Extensive chronic degenerative changes of the spine. IMPRESSION: Short segment of wall thickening of the mid descending colon extending over about 7 cm. In this patient without any visible diverticula an with advanced atherosclerotic disease of the aorta and nonvisualization of flow in the IMA, this probably represents a short segment of ischemic colitis. No advanced finding. There is only mild stranding in the surrounding fat. Gallstones and sludge dependent in the gallbladder but no CT evidence of cholecystitis or obstruction. Electronically Signed   By: Paulina FusiMark  Shogry M.D.   On: 05/03/2017 20:58  LAB RESULTS: Basic Metabolic Panel: Recent Labs  Lab 05/04/17 0054  05/06/17 0636 05/07/17 0401  NA  --    < > 137 136  K  --    < > 3.9 3.5  CL  --    < > 107 105  CO2  --    < > 22 23  GLUCOSE  --    < > 109* 100*  BUN  --    < > 7 5*  CREATININE  --    < > 0.67 0.67  CALCIUM  --    < > 8.8* 8.7*  MG 1.8  --   --   --    < > = values in this interval not displayed.   Liver Function  Tests: Recent Labs  Lab 05/06/17 0636 05/07/17 0401  AST 16 15  ALT 13* 12*  ALKPHOS 40 36*  BILITOT 0.8 0.7  PROT 5.9* 5.7*  ALBUMIN 3.3* 3.1*   Recent Labs  Lab 05/03/17 1848  LIPASE 31   No results for input(s): AMMONIA in the last 168 hours. CBC: Recent Labs  Lab 05/06/17 0636 05/07/17 0401  WBC 6.1 6.0  NEUTROABS  --  2.9  HGB 12.1 11.6*  HCT 35.0* 34.2*  MCV 88.4 90.0  PLT 287 305   Cardiac Enzymes: No results for input(s): CKTOTAL, CKMB, CKMBINDEX, TROPONINI in the last 168 hours. BNP: Invalid input(s): POCBNP CBG: No results for input(s): GLUCAP in the last 168 hours.    Disposition and Follow-up: Discharge Instructions    Diet - low sodium heart healthy   Complete by:  As directed    Increase activity slowly   Complete by:  As directed        DISPOSITION: Home   DISCHARGE FOLLOW-UP    Time coordinating discharge:  35 minutes  Signed:   Thad Ranger M.D. [facility] 05/07/2017, 11:07 AM Pager: [phone]

## 2017-05-07 NOTE — Evaluation (Signed)
Physical Therapy Evaluation Patient Details Name: [name] MRN: [id number] DOB: 1932/04/27 Today's Date: 05/07/2017   History of Present Illness  84 y.o. female with medical history significant of HTN, HLD, diverticulosis, and anxiety/depression; who presents with complaints of lower abdominal pain. CT showed colitis/gastroenteritis  Clinical Impression  Patient evaluated by Physical Therapy with no further acute PT needs identified.  PT is signing off. Thank you for this referral.     Follow Up Recommendations No PT follow up    Equipment Recommendations  None recommended by PT    Recommendations for Other Services       Precautions / Restrictions Precautions Precautions: None Restrictions Weight Bearing Restrictions: No      Mobility  Bed Mobility Overal bed mobility: Modified Independent             General bed mobility comments: HOB was elevated; anticipate no difficulty when flat  Transfers Overall transfer level: Independent Equipment used: None             General transfer comment: from bed and from toilet  Ambulation/Gait Ambulation/Gait assistance: Independent Ambulation Distance (Feet): 30 Feet Assistive device: None Gait Pattern/deviations: WFL(Within Functional Limits)        Stairs            Wheelchair Mobility    Modified Rankin (Stroke Patients Only)       Balance Overall balance assessment: Modified Independent                             High Level Balance Comments: bending to floor with one hand to steady on bed; able to stand feet together no LOB             Pertinent Vitals/Pain Pain Assessment: No/denies pain    Home Living Family/patient expects to be discharged to:: Private residence Living Arrangements: Children Available Help at Discharge: Family Type of Home: House       Home Layout: Two level;Able to live on main level with bedroom/bathroom        Prior Function Level of  Independence: Independent               Hand Dominance        Extremity/Trunk Assessment   Upper Extremity Assessment Upper Extremity Assessment: Overall WFL for tasks assessed    Lower Extremity Assessment Lower Extremity Assessment: Overall WFL for tasks assessed       Communication   Communication: No difficulties  Cognition Arousal/Alertness: Awake/alert Behavior During Therapy: WFL for tasks assessed/performed Overall Cognitive Status: Within Functional Limits for tasks assessed                                        General Comments      Exercises     Assessment/Plan    PT Assessment Patent does not need any further PT services  PT Problem List         PT Treatment Interventions      PT Goals (Current goals can be found in the Care Plan section)  Acute Rehab PT Goals PT Goal Formulation: All assessment and education complete, DC therapy    Frequency     Barriers to discharge        Co-evaluation               AM-PAC PT "6 Clicks"  Daily Activity  Outcome Measure Difficulty turning over in bed (including adjusting bedclothes, sheets and blankets)?: None Difficulty moving from lying on back to sitting on the side of the bed? : None Difficulty sitting down on and standing up from a chair with arms (e.g., wheelchair, bedside commode, etc,.)?: None Help needed moving to and from a bed to chair (including a wheelchair)?: None Help needed walking in hospital room?: None Help needed climbing 3-5 steps with a railing? : None 6 Click Score: 24    End of Session   Activity Tolerance: Patient tolerated treatment well Patient left: in chair;with call bell/phone within reach;with chair alarm set   PT Visit Diagnosis: Muscle weakness (generalized) (M62.81)    Time: 8657-8469 PT Time Calculation (min) (ACUTE ONLY): 15 min   Charges:   PT Evaluation $PT Eval Low Complexity: 1 Low     PT G Codes:          [employer], PT 05/07/2017, 11:58 AM

## 2017-06-29 ENCOUNTER — Encounter (HOSPITAL_COMMUNITY): Payer: Self-pay

## 2017-06-29 ENCOUNTER — Emergency Department (HOSPITAL_COMMUNITY): Payer: No Typology Code available for payment source

## 2017-06-29 ENCOUNTER — Emergency Department (HOSPITAL_COMMUNITY)
Admission: EM | Admit: 2017-06-29 | Discharge: 2017-06-29 | Disposition: A | Discharge status: Home / Self Care | Payer: No Typology Code available for payment source | Attending: Emergency Medicine | Admitting: Emergency Medicine

## 2017-06-29 DIAGNOSIS — Z23 Encounter for immunization: Secondary | ICD-10-CM | POA: Insufficient documentation

## 2017-06-29 DIAGNOSIS — Y999 Unspecified external cause status: Secondary | ICD-10-CM | POA: Insufficient documentation

## 2017-06-29 DIAGNOSIS — Y9389 Activity, other specified: Secondary | ICD-10-CM | POA: Diagnosis not present

## 2017-06-29 DIAGNOSIS — E039 Hypothyroidism, unspecified: Secondary | ICD-10-CM | POA: Diagnosis not present

## 2017-06-29 DIAGNOSIS — Z79899 Other long term (current) drug therapy: Secondary | ICD-10-CM | POA: Diagnosis not present

## 2017-06-29 DIAGNOSIS — R000 Tachycardia, unspecified: Secondary | ICD-10-CM | POA: Diagnosis not present

## 2017-06-29 DIAGNOSIS — I10 Essential (primary) hypertension: Secondary | ICD-10-CM | POA: Diagnosis not present

## 2017-06-29 DIAGNOSIS — S0990XA Unspecified injury of head, initial encounter: Secondary | ICD-10-CM | POA: Diagnosis present

## 2017-06-29 DIAGNOSIS — Y92410 Unspecified street and highway as the place of occurrence of the external cause: Secondary | ICD-10-CM | POA: Insufficient documentation

## 2017-06-29 DIAGNOSIS — S01111A Laceration without foreign body of right eyelid and periocular area, initial encounter: Secondary | ICD-10-CM | POA: Diagnosis not present

## 2017-06-29 DIAGNOSIS — Z96652 Presence of left artificial knee joint: Secondary | ICD-10-CM | POA: Insufficient documentation

## 2017-06-29 DIAGNOSIS — S0083XA Contusion of other part of head, initial encounter: Secondary | ICD-10-CM

## 2017-06-29 DIAGNOSIS — S39012A Strain of muscle, fascia and tendon of lower back, initial encounter: Secondary | ICD-10-CM | POA: Diagnosis not present

## 2017-06-29 LAB — SAMPLE TO BLOOD BANK

## 2017-06-29 LAB — COMPREHENSIVE METABOLIC PANEL
ALT: 14 U/L (ref 14–54)
ANION GAP: 10 (ref 5–15)
AST: 20 U/L (ref 15–41)
Albumin: 3.9 g/dL (ref 3.5–5.0)
Alkaline Phosphatase: 53 U/L (ref 38–126)
BUN: 9 mg/dL (ref 6–20)
CHLORIDE: 100 mmol/L — AB (ref 101–111)
CO2: 25 mmol/L (ref 22–32)
Calcium: 8.9 mg/dL (ref 8.9–10.3)
Creatinine, Ser: 0.72 mg/dL (ref 0.44–1.00)
Glucose, Bld: 137 mg/dL — ABNORMAL HIGH (ref 65–99)
POTASSIUM: 3.6 mmol/L (ref 3.5–5.1)
SODIUM: 135 mmol/L (ref 135–145)
Total Bilirubin: 0.9 mg/dL (ref 0.3–1.2)
Total Protein: 6.9 g/dL (ref 6.5–8.1)

## 2017-06-29 LAB — CBC
HEMATOCRIT: 38.1 % (ref 36.0–46.0)
HEMOGLOBIN: 12.8 g/dL (ref 12.0–15.0)
MCH: 30.4 pg (ref 26.0–34.0)
MCHC: 33.6 g/dL (ref 30.0–36.0)
MCV: 90.5 fL (ref 78.0–100.0)
PLATELETS: 239 10*3/uL (ref 150–400)
RBC: 4.21 MIL/uL (ref 3.87–5.11)
RDW: 12.9 % (ref 11.5–15.5)
WBC: 12 10*3/uL — AB (ref 4.0–10.5)

## 2017-06-29 MED ORDER — SODIUM CHLORIDE 0.9 % IV BOLUS
1000.0000 mL | Freq: Once | INTRAVENOUS | Status: AC
  Administered 2017-06-29: 1000 mL via INTRAVENOUS

## 2017-06-29 MED ORDER — IOHEXOL 300 MG/ML  SOLN
100.0000 mL | Freq: Once | INTRAMUSCULAR | Status: AC | PRN
  Administered 2017-06-29: 100 mL via INTRAVENOUS

## 2017-06-29 MED ORDER — TETANUS-DIPHTH-ACELL PERTUSSIS 5-2.5-18.5 LF-MCG/0.5 IM SUSP
0.5000 mL | Freq: Once | INTRAMUSCULAR | Status: AC
  Administered 2017-06-29: 0.5 mL via INTRAMUSCULAR
  Filled 2017-06-29: qty 0.5

## 2017-06-29 MED ORDER — FENTANYL CITRATE (PF) 100 MCG/2ML IJ SOLN
50.0000 ug | Freq: Once | INTRAMUSCULAR | Status: AC
  Administered 2017-06-29: 50 ug via INTRAVENOUS
  Filled 2017-06-29: qty 2

## 2017-06-29 MED ORDER — LIDOCAINE HCL (PF) 1 % IJ SOLN
5.0000 mL | Freq: Once | INTRAMUSCULAR | Status: AC
  Administered 2017-06-29: 5 mL via INTRADERMAL
  Filled 2017-06-29: qty 5

## 2017-06-29 NOTE — ED Notes (Signed)
Small laceration over right eyebrow cleaned and irrigated
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
## 2017-06-29 NOTE — ED Notes (Signed)
Pt taken to CT.

## 2017-06-29 NOTE — ED Notes (Addendum)
Pt verbalized understanding of d/c instructions and has no further questions, VSS, NAD. Pt d/c home with cab voucher.

## 2017-06-29 NOTE — Discharge Instructions (Signed)
The stitches to your right eyebrow will dissolve. They do not need to be removed.
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
## 2017-08-16 ENCOUNTER — Ambulatory Visit (INDEPENDENT_AMBULATORY_CARE_PROVIDER_SITE_OTHER): Payer: Self-pay | Admitting: Orthopaedic Surgery

## 2018-07-16 ENCOUNTER — Other Ambulatory Visit: Payer: Self-pay | Admitting: Family Medicine

## 2018-07-16 DIAGNOSIS — G8929 Other chronic pain: Secondary | ICD-10-CM

## 2018-07-16 DIAGNOSIS — M25562 Pain in left knee: Secondary | ICD-10-CM

## 2018-10-26 ENCOUNTER — Emergency Department (HOSPITAL_COMMUNITY): Payer: Medicare Other

## 2018-10-26 ENCOUNTER — Encounter (HOSPITAL_COMMUNITY): Payer: Self-pay | Admitting: Emergency Medicine

## 2018-10-26 ENCOUNTER — Inpatient Hospital Stay (HOSPITAL_COMMUNITY)
Admission: EM | Admit: 2018-10-26 | Discharge: 2018-11-02 | DRG: 917 | Disposition: A | Discharge status: Skilled Nursing Facility | Payer: Medicare Other | Attending: Family Medicine | Admitting: Family Medicine

## 2018-10-26 DIAGNOSIS — E119 Type 2 diabetes mellitus without complications: Secondary | ICD-10-CM

## 2018-10-26 DIAGNOSIS — Z96652 Presence of left artificial knee joint: Secondary | ICD-10-CM | POA: Diagnosis present

## 2018-10-26 DIAGNOSIS — E782 Mixed hyperlipidemia: Secondary | ICD-10-CM | POA: Diagnosis present

## 2018-10-26 DIAGNOSIS — F05 Delirium due to known physiological condition: Secondary | ICD-10-CM | POA: Diagnosis present

## 2018-10-26 DIAGNOSIS — I160 Hypertensive urgency: Secondary | ICD-10-CM | POA: Diagnosis present

## 2018-10-26 DIAGNOSIS — G9340 Encephalopathy, unspecified: Secondary | ICD-10-CM | POA: Diagnosis present

## 2018-10-26 DIAGNOSIS — I10 Essential (primary) hypertension: Secondary | ICD-10-CM | POA: Diagnosis present

## 2018-10-26 DIAGNOSIS — R410 Disorientation, unspecified: Secondary | ICD-10-CM

## 2018-10-26 DIAGNOSIS — E039 Hypothyroidism, unspecified: Secondary | ICD-10-CM | POA: Diagnosis present

## 2018-10-26 DIAGNOSIS — F329 Major depressive disorder, single episode, unspecified: Secondary | ICD-10-CM | POA: Diagnosis present

## 2018-10-26 DIAGNOSIS — Z833 Family history of diabetes mellitus: Secondary | ICD-10-CM

## 2018-10-26 DIAGNOSIS — E1136 Type 2 diabetes mellitus with diabetic cataract: Secondary | ICD-10-CM | POA: Diagnosis present

## 2018-10-26 DIAGNOSIS — Z8249 Family history of ischemic heart disease and other diseases of the circulatory system: Secondary | ICD-10-CM

## 2018-10-26 DIAGNOSIS — F0390 Unspecified dementia without behavioral disturbance: Secondary | ICD-10-CM | POA: Diagnosis present

## 2018-10-26 DIAGNOSIS — Z90710 Acquired absence of both cervix and uterus: Secondary | ICD-10-CM

## 2018-10-26 DIAGNOSIS — X58XXXA Exposure to other specified factors, initial encounter: Secondary | ICD-10-CM | POA: Diagnosis present

## 2018-10-26 DIAGNOSIS — Z791 Long term (current) use of non-steroidal anti-inflammatories (NSAID): Secondary | ICD-10-CM

## 2018-10-26 DIAGNOSIS — T424X1A Poisoning by benzodiazepines, accidental (unintentional), initial encounter: Principal | ICD-10-CM | POA: Diagnosis present

## 2018-10-26 DIAGNOSIS — Z79899 Other long term (current) drug therapy: Secondary | ICD-10-CM

## 2018-10-26 DIAGNOSIS — G894 Chronic pain syndrome: Secondary | ICD-10-CM | POA: Diagnosis present

## 2018-10-26 DIAGNOSIS — Z20828 Contact with and (suspected) exposure to other viral communicable diseases: Secondary | ICD-10-CM | POA: Diagnosis present

## 2018-10-26 DIAGNOSIS — F411 Generalized anxiety disorder: Secondary | ICD-10-CM | POA: Diagnosis present

## 2018-10-26 DIAGNOSIS — Z888 Allergy status to other drugs, medicaments and biological substances status: Secondary | ICD-10-CM

## 2018-10-26 DIAGNOSIS — F1390 Sedative, hypnotic, or anxiolytic use, unspecified, uncomplicated: Secondary | ICD-10-CM | POA: Diagnosis present

## 2018-10-26 DIAGNOSIS — M19041 Primary osteoarthritis, right hand: Secondary | ICD-10-CM | POA: Diagnosis present

## 2018-10-26 DIAGNOSIS — Z79891 Long term (current) use of opiate analgesic: Secondary | ICD-10-CM

## 2018-10-26 DIAGNOSIS — F330 Major depressive disorder, recurrent, mild: Secondary | ICD-10-CM | POA: Diagnosis present

## 2018-10-26 DIAGNOSIS — Z781 Physical restraint status: Secondary | ICD-10-CM

## 2018-10-26 DIAGNOSIS — M549 Dorsalgia, unspecified: Secondary | ICD-10-CM | POA: Diagnosis present

## 2018-10-26 DIAGNOSIS — Y929 Unspecified place or not applicable: Secondary | ICD-10-CM

## 2018-10-26 DIAGNOSIS — Z79890 Hormone replacement therapy (postmenopausal): Secondary | ICD-10-CM

## 2018-10-26 DIAGNOSIS — E860 Dehydration: Secondary | ICD-10-CM | POA: Diagnosis present

## 2018-10-26 DIAGNOSIS — G92 Toxic encephalopathy: Secondary | ICD-10-CM | POA: Diagnosis present

## 2018-10-26 DIAGNOSIS — E538 Deficiency of other specified B group vitamins: Secondary | ICD-10-CM | POA: Diagnosis present

## 2018-10-26 DIAGNOSIS — T465X6A Underdosing of other antihypertensive drugs, initial encounter: Secondary | ICD-10-CM | POA: Diagnosis present

## 2018-10-26 DIAGNOSIS — M19042 Primary osteoarthritis, left hand: Secondary | ICD-10-CM | POA: Diagnosis present

## 2018-10-26 LAB — COMPREHENSIVE METABOLIC PANEL
ALT: 13 U/L (ref 0–44)
AST: 20 U/L (ref 15–41)
Albumin: 3.7 g/dL (ref 3.5–5.0)
Alkaline Phosphatase: 44 U/L (ref 38–126)
Anion gap: 10 (ref 5–15)
BUN: 13 mg/dL (ref 8–23)
CO2: 24 mmol/L (ref 22–32)
Calcium: 8.5 mg/dL — ABNORMAL LOW (ref 8.9–10.3)
Chloride: 102 mmol/L (ref 98–111)
Creatinine, Ser: 0.67 mg/dL (ref 0.44–1.00)
GFR calc Af Amer: >60 mL/min (ref >60)
GFR calc non Af Amer: >60 mL/min (ref >60)
Glucose, Bld: 129 mg/dL — ABNORMAL HIGH (ref 70–99)
Potassium: 3.7 mmol/L (ref 3.5–5.1)
Sodium: 136 mmol/L (ref 135–145)
Total Bilirubin: 0.9 mg/dL (ref 0.3–1.2)
Total Protein: 6.3 g/dL — ABNORMAL LOW (ref 6.5–8.1)

## 2018-10-26 LAB — CBC WITH DIFFERENTIAL/PLATELET
Abs Immature Granulocytes: 0.03 10*3/uL (ref 0.00–0.07)
Basophils Absolute: 0 10*3/uL (ref 0.0–0.1)
Basophils Relative: 0 %
Eosinophils Absolute: 0 10*3/uL (ref 0.0–0.5)
Eosinophils Relative: 0 %
HCT: 38.3 % (ref 36.0–46.0)
Hemoglobin: 13.3 g/dL (ref 12.0–15.0)
Immature Granulocytes: 0 %
Lymphocytes Relative: 9 %
Lymphs Abs: 0.8 10*3/uL (ref 0.7–4.0)
MCH: 31.2 pg (ref 26.0–34.0)
MCHC: 34.7 g/dL (ref 30.0–36.0)
MCV: 89.9 fL (ref 80.0–100.0)
Monocytes Absolute: 0.3 10*3/uL (ref 0.1–1.0)
Monocytes Relative: 4 %
Neutro Abs: 7.7 10*3/uL (ref 1.7–7.7)
Neutrophils Relative %: 87 %
Platelets: 207 10*3/uL (ref 150–400)
RBC: 4.26 MIL/uL (ref 3.87–5.11)
RDW: 12.3 % (ref 11.5–15.5)
WBC: 8.9 10*3/uL (ref 4.0–10.5)
nRBC: 0 % (ref 0.0–0.2)

## 2018-10-26 LAB — URINALYSIS, ROUTINE W REFLEX MICROSCOPIC
Bilirubin Urine: NEGATIVE
Glucose, UA: NEGATIVE mg/dL
Hgb urine dipstick: NEGATIVE
Ketones, ur: NEGATIVE mg/dL
Leukocytes,Ua: NEGATIVE
Nitrite: NEGATIVE
Protein, ur: NEGATIVE mg/dL
Specific Gravity, Urine: 1.008 (ref 1.005–1.030)
pH: 7 (ref 5.0–8.0)

## 2018-10-26 LAB — TSH: TSH: 1.034 u[IU]/mL (ref 0.350–4.500)

## 2018-10-26 LAB — AMMONIA: Ammonia: 23 umol/L (ref 9–35)

## 2018-10-26 NOTE — ED Notes (Signed)
Patient requesting to go home.  Family member at the bedside.  Encouraged to stay for ct scan.  Agreed at this time.  Phone number left on white board of room for daughter in law.  Requested a call at dispo.
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
## 2018-10-26 NOTE — ED Notes (Signed)
Taken to CT at this time. 
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
## 2018-10-27 ENCOUNTER — Observation Stay (HOSPITAL_COMMUNITY): Payer: Medicare Other

## 2018-10-27 DIAGNOSIS — E782 Mixed hyperlipidemia: Secondary | ICD-10-CM | POA: Diagnosis not present

## 2018-10-27 DIAGNOSIS — I10 Essential (primary) hypertension: Secondary | ICD-10-CM | POA: Diagnosis not present

## 2018-10-27 DIAGNOSIS — G9340 Encephalopathy, unspecified: Secondary | ICD-10-CM | POA: Diagnosis not present

## 2018-10-27 DIAGNOSIS — E038 Other specified hypothyroidism: Secondary | ICD-10-CM | POA: Diagnosis not present

## 2018-10-27 LAB — COMPREHENSIVE METABOLIC PANEL
ALT: 15 U/L (ref 0–44)
AST: 21 U/L (ref 15–41)
Albumin: 4 g/dL (ref 3.5–5.0)
Alkaline Phosphatase: 49 U/L (ref 38–126)
Anion gap: 13 (ref 5–15)
BUN: 17 mg/dL (ref 8–23)
CO2: 22 mmol/L (ref 22–32)
Calcium: 9.1 mg/dL (ref 8.9–10.3)
Chloride: 101 mmol/L (ref 98–111)
Creatinine, Ser: 0.71 mg/dL (ref 0.44–1.00)
GFR calc Af Amer: >60 mL/min (ref >60)
GFR calc non Af Amer: >60 mL/min (ref >60)
Glucose, Bld: 114 mg/dL — ABNORMAL HIGH (ref 70–99)
Potassium: 3.8 mmol/L (ref 3.5–5.1)
Sodium: 136 mmol/L (ref 135–145)
Total Bilirubin: 0.7 mg/dL (ref 0.3–1.2)
Total Protein: 6.6 g/dL (ref 6.5–8.1)

## 2018-10-27 LAB — VITAMIN B12: Vitamin B-12: 252 pg/mL (ref 180–914)

## 2018-10-27 LAB — POCT I-STAT EG7
Acid-Base Excess: 3 mmol/L — ABNORMAL HIGH (ref 0.0–2.0)
Bicarbonate: 27.8 mmol/L (ref 20.0–28.0)
Calcium, Ion: 1.19 mmol/L (ref 1.15–1.40)
HCT: 42 % (ref 36.0–46.0)
Hemoglobin: 14.3 g/dL (ref 12.0–15.0)
O2 Saturation: 100 %
Potassium: 3.6 mmol/L (ref 3.5–5.1)
Sodium: 138 mmol/L (ref 135–145)
TCO2: 29 mmol/L (ref 22–32)
pCO2, Ven: 41.1 mmHg — ABNORMAL LOW (ref 44.0–60.0)
pH, Ven: 7.439 — ABNORMAL HIGH (ref 7.250–7.430)
pO2, Ven: 169 mmHg — ABNORMAL HIGH (ref 32.0–45.0)

## 2018-10-27 LAB — CBC
HCT: 38.6 % (ref 36.0–46.0)
Hemoglobin: 13.4 g/dL (ref 12.0–15.0)
MCH: 31 pg (ref 26.0–34.0)
MCHC: 34.7 g/dL (ref 30.0–36.0)
MCV: 89.4 fL (ref 80.0–100.0)
Platelets: 228 10*3/uL (ref 150–400)
RBC: 4.32 MIL/uL (ref 3.87–5.11)
RDW: 12.3 % (ref 11.5–15.5)
WBC: 8.6 10*3/uL (ref 4.0–10.5)
nRBC: 0 % (ref 0.0–0.2)

## 2018-10-27 LAB — RAPID URINE DRUG SCREEN, HOSP PERFORMED
Amphetamines: NOT DETECTED
Barbiturates: NOT DETECTED
Benzodiazepines: NOT DETECTED
Cocaine: NOT DETECTED
Opiates: POSITIVE — AB
Tetrahydrocannabinol: NOT DETECTED

## 2018-10-27 LAB — HEMOGLOBIN A1C
Hgb A1c MFr Bld: 5.6 % (ref 4.8–5.6)
Mean Plasma Glucose: 114.02 mg/dL

## 2018-10-27 LAB — PHOSPHORUS: Phosphorus: 3.3 mg/dL (ref 2.5–4.6)

## 2018-10-27 LAB — TSH: TSH: 1.272 u[IU]/mL (ref 0.350–4.500)

## 2018-10-27 LAB — SARS CORONAVIRUS 2 (TAT 6-24 HRS): SARS Coronavirus 2: NEGATIVE

## 2018-10-27 LAB — CBG MONITORING, ED
Glucose-Capillary: 156 mg/dL — ABNORMAL HIGH (ref 70–99)
Glucose-Capillary: 127 mg/dL — ABNORMAL HIGH (ref 70–99)

## 2018-10-27 LAB — ETHANOL: Alcohol, Ethyl (B): <10 mg/dL (ref <10)

## 2018-10-27 LAB — MAGNESIUM: Magnesium: 2 mg/dL (ref 1.7–2.4)

## 2018-10-27 LAB — GLUCOSE, CAPILLARY: Glucose-Capillary: 117 mg/dL — ABNORMAL HIGH (ref 70–99)

## 2018-10-27 MED ORDER — ACETAMINOPHEN 325 MG PO TABS
650.0000 mg | ORAL_TABLET | Freq: Four times a day (QID) | ORAL | Status: DC | PRN
  Administered 2018-10-27 – 2018-10-31 (×3): 650 mg via ORAL
  Filled 2018-10-27 (×4): qty 2

## 2018-10-27 MED ORDER — ONDANSETRON HCL 4 MG/2ML IJ SOLN: 4.0000 mg | Freq: Four times a day (QID) | INTRAMUSCULAR | Status: DC | PRN

## 2018-10-27 MED ORDER — LOSARTAN POTASSIUM 50 MG PO TABS
50.0000 mg | ORAL_TABLET | Freq: Every day | ORAL | Status: DC
  Administered 2018-10-28 – 2018-11-02 (×6): 50 mg via ORAL
  Filled 2018-10-27 (×7): qty 1

## 2018-10-27 MED ORDER — HALOPERIDOL LACTATE 5 MG/ML IJ SOLN
2.0000 mg | Freq: Four times a day (QID) | INTRAMUSCULAR | Status: DC | PRN
  Administered 2018-10-27 – 2018-10-29 (×4): 2 mg via INTRAVENOUS
  Filled 2018-10-27 (×4): qty 1

## 2018-10-27 MED ORDER — ENOXAPARIN SODIUM 40 MG/0.4ML ~~LOC~~ SOLN
40.0000 mg | SUBCUTANEOUS | Status: DC
  Administered 2018-10-27 – 2018-11-01 (×6): 40 mg via SUBCUTANEOUS
  Filled 2018-10-27 (×5): qty 0.4

## 2018-10-27 MED ORDER — ONDANSETRON HCL 4 MG PO TABS: 4.0000 mg | ORAL_TABLET | Freq: Four times a day (QID) | ORAL | Status: DC | PRN

## 2018-10-27 MED ORDER — LORAZEPAM 2 MG/ML IJ SOLN
1.0000 mg | Freq: Four times a day (QID) | INTRAMUSCULAR | Status: DC | PRN
  Administered 2018-10-27 – 2018-11-01 (×3): 1 mg via INTRAVENOUS
  Filled 2018-10-27 (×3): qty 1

## 2018-10-27 MED ORDER — ENALAPRILAT 1.25 MG/ML IV SOLN
0.6250 mg | Freq: Once | INTRAVENOUS | Status: AC | PRN
  Administered 2018-10-27: 0.625 mg via INTRAVENOUS
  Filled 2018-10-27: qty 1

## 2018-10-27 MED ORDER — METOPROLOL SUCCINATE ER 100 MG PO TB24
100.0000 mg | ORAL_TABLET | Freq: Every day | ORAL | Status: DC
  Administered 2018-10-28 – 2018-11-02 (×6): 100 mg via ORAL
  Filled 2018-10-27 (×8): qty 1

## 2018-10-27 MED ORDER — HYDROCODONE-ACETAMINOPHEN 5-325 MG PO TABS
1.0000 | ORAL_TABLET | Freq: Four times a day (QID) | ORAL | Status: DC | PRN
  Administered 2018-10-29 – 2018-10-30 (×2): 1 via ORAL
  Administered 2018-10-31 – 2018-11-01 (×2): 2 via ORAL
  Filled 2018-10-27: qty 1
  Filled 2018-10-27 (×2): qty 2
  Filled 2018-10-27: qty 1
  Filled 2018-10-27: qty 2

## 2018-10-27 MED ORDER — SERTRALINE HCL 50 MG PO TABS
50.0000 mg | ORAL_TABLET | Freq: Every day | ORAL | Status: DC
  Administered 2018-10-28 – 2018-11-02 (×6): 50 mg via ORAL
  Filled 2018-10-27 (×7): qty 1

## 2018-10-27 MED ORDER — INSULIN ASPART 100 UNIT/ML ~~LOC~~ SOLN: 0.0000 [IU] | Freq: Every day | SUBCUTANEOUS | Status: DC

## 2018-10-27 MED ORDER — INSULIN ASPART 100 UNIT/ML ~~LOC~~ SOLN
0.0000 [IU] | Freq: Three times a day (TID) | SUBCUTANEOUS | Status: DC
  Administered 2018-10-27: 2 [IU] via SUBCUTANEOUS
  Administered 2018-10-27: 1 [IU] via SUBCUTANEOUS
  Administered 2018-10-28 – 2018-10-29 (×3): 2 [IU] via SUBCUTANEOUS
  Administered 2018-10-30 – 2018-11-01 (×5): 1 [IU] via SUBCUTANEOUS

## 2018-10-27 MED ORDER — HYDROCODONE-ACETAMINOPHEN 5-325 MG PO TABS: 1.0000 | ORAL_TABLET | ORAL | Status: DC | PRN

## 2018-10-27 MED ORDER — ACETAMINOPHEN 650 MG RE SUPP: 650.0000 mg | Freq: Four times a day (QID) | RECTAL | Status: DC | PRN

## 2018-10-27 MED ORDER — CLONAZEPAM 0.5 MG PO TABS
0.5000 mg | ORAL_TABLET | Freq: Two times a day (BID) | ORAL | Status: DC
  Administered 2018-10-27 – 2018-10-28 (×3): 0.5 mg via ORAL
  Filled 2018-10-27 (×5): qty 1

## 2018-10-27 NOTE — Care Management (Signed)
Patient has been agitated and combative patient is now in restraints.  Will need to follow up later with transitional care planning.  Laurena Slimmer RN, BSN  ED Care Manager [phone]

## 2018-10-27 NOTE — ED Notes (Signed)
This RN has called the pharmacy to verify meds that are due and also asked them to tube the meds to the ED. Will await medication delivery.

## 2018-10-27 NOTE — ED Notes (Signed)
Pt incontinent of stool; pt cleaned, linens and brief changed

## 2018-10-27 NOTE — Progress Notes (Signed)
CSW received consult for skilled nursing facility. CSW spoke with family and assessed for skilled nursing facility  concerns. CSW notes family reports that they want patient to return home and not go to a SNF. CSW additionally noted there is some family conflict regarding her finances and treatment. Per daughter's report, son is legal guardian or HCPOA. No current paperwork to confirm this. Patient's family was refused to discussion of skilled nursing facility. CSW discussed community resources and options for support.  Patient declined resources or intent to follow up at this time.   Daphine Deutscher, LCSW, [facility], [place], [place] [phone]

## 2018-10-27 NOTE — ED Notes (Signed)
Pt continues to get up out of bed. Pt redirected back in bed.

## 2018-10-27 NOTE — ED Notes (Signed)
Report attempted x2
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
## 2018-10-27 NOTE — Progress Notes (Signed)
PROGRESS NOTE        PATIENT DETAILS Name: [name] Age: 85 y.o. Sex: female Date of Birth: 12/15/1932 Admit Date: 10/26/2018 Admitting Physician Toy Baker, MD GMW:NUUVOZ, Jeneen Rinks, MD  Brief Narrative: Patient is a 85 y.o. female with past medical history of HTN, DM-2, anxiety, chronic pain syndrome on narcotics-probable dementia (see below) found wandering in a wooded area in her neighborhood with a knife along with her dog.  She was found to be very confused and found to have significantly elevated blood pressures.  She was subsequently brought to the emergency room for further evaluation and treatment.  See below for further details.  Subjective: She is awake-somewhat alert-not very fluent with her answers but able to answer some of my questions appropriately.  Most of her answers was then corroborated by her daughter-who I spoke with on the phone.  Assessment/Plan: Acute metabolic encephalopathy: Suspect this was secondary to dehydration (out in the woods for a prolonged period of time) and uncontrolled hypertension on a background of probable undiagnosed dementia with concurrent use of narcotics and benzodiazepines.  Her mentation seems to have improved compared to what was described in the ED notes and H&P-although I am not certain she is back to her baseline-no family member at bedside.  She is still not fluent with her answers-and sometimes answers questions very bizarrely.  Awaiting arrival of daughter-while she is inpatient-check EEG, MRI brain, RPR and vitamin B12.  TSH within normal limits.  Hypertension: Uncontrolled-secondary to noncompliance.  We have resumed her antihypertensive medications-BP is somewhat better than yesterday.  Follow and adjust.  Probable undiagnosed dementia: Long discussion with the patient's daughter over the phone-she moved in with her mother approximately 3 years back as she noticed some behavioral and memory issues.   Per daughter it is not unusual for the patient to be confused especially in the mornings.  Sometimes answers are not very fluent.  Patient has had 1 prior incidence of where she has left the house and was found more confused than her baseline.  Patient also is maintained on Klonopin and hydrocodone for more than a year-which are probably not helping.  Given this history-I suspect patient has had undiagnosed dementia-while she is inpatient-we will check a RPR, vitamin B12, EEG and MRI brain.  No limits.  Anxiety: Resume Klonopin-patient has been on Klonopin for more than a year.  Have asked the patient's daughter to talk with PCP to see if she can be slowly titrated off this medication.  Chronic pain syndrome: Patient apparently has been on hydrocodone/acetaminophen combination for more than a year-we will resume-have asked patient's daughter to talk with patient's PCP to see if she can be titrated off this medication.  Other issues: Unsafe discharge at this point-daughter works during the daytime during which the patient is all by herself.  Per daughter-patient usually is awake at night-and sleeps during the day while daughter is at work.  Patient needs a social work, physical therapy and case management evaluation to make sure home environment is safe for discharge.  We will await evaluation by these services.  Diet: Diet Order            Diet Carb Modified Fluid consistency: Thin; Room service appropriate? Yes  Diet effective now               DVT Prophylaxis: Prophylactic  Lovenox  Code Status: Full code   Family Communication: Daughter at bedside  Disposition Plan: Remain inpatient-needs case management/social work evaluation to make sure she is a safe discharge-see above.  MRI brain/EEG also pending.  Antimicrobial agents: Anti-infectives (From admission, onward)   None      Procedures: None  CONSULTS:  None  Time spent: 25 minutes-Greater than 50% of this time was  spent in counseling, explanation of diagnosis, planning of further management, and coordination of care.  MEDICATIONS: Scheduled Meds: . clonazePAM  0.5 mg Oral BID  . insulin aspart  0-5 Units Subcutaneous QHS  . insulin aspart  0-9 Units Subcutaneous TID WC  . losartan  50 mg Oral Daily  . metoprolol succinate  100 mg Oral Daily  . sertraline  50 mg Oral Daily   Continuous Infusions: PRN Meds:.acetaminophen **OR** acetaminophen, HYDROcodone-acetaminophen, ondansetron **OR** ondansetron (ZOFRAN) IV   PHYSICAL EXAM: Vital signs: Vitals:   10/27/18 0715 10/27/18 0845 10/27/18 0930 10/27/18 0945  BP: 134/77 (!) 140/91 (!) 143/77 (!) 145/80  Pulse: 72 71 72 71  Resp: 14 16 (!) 24 15  Temp:      TempSrc:      SpO2: 98% 97% 97% 96%   There were no vitals filed for this visit. There is no height or weight on file to calculate BMI.   Gen Exam:Alert awake-not in any distress HEENT:atraumatic, normocephalic Chest: B/L clear to auscultation anteriorly CVS:S1S2 regular Abdomen:soft non tender, non distended Extremities:no edema Neurology: Non focal Skin: Numerous scratch marks/abrasions in her lower extremities.  I have personally reviewed following labs and imaging studies  LABORATORY DATA: CBC: Recent Labs  Lab 10/26/18 1803 10/27/18 0220 10/27/18 0302  WBC 8.9 8.6  --   NEUTROABS 7.7  --   --   HGB 13.3 13.4 14.3  HCT 38.3 38.6 42.0  MCV 89.9 89.4  --   PLT 207 228  --     Basic Metabolic Panel: Recent Labs  Lab 10/26/18 1803 10/27/18 0220 10/27/18 0302  NA 136 136 138  K 3.7 3.8 3.6  CL 102 101  --   CO2 24 22  --   GLUCOSE 129* 114*  --   BUN 13 17  --   CREATININE 0.67 0.71  --   CALCIUM 8.5* 9.1  --   MG  --  2.0  --   PHOS  --  3.3  --     GFR: CrCl cannot be calculated (Unknown ideal weight.).  Liver Function Tests: Recent Labs  Lab 10/26/18 1803 10/27/18 0220  AST 20 21  ALT 13 15  ALKPHOS 44 49  BILITOT 0.9 0.7  PROT 6.3* 6.6   ALBUMIN 3.7 4.0   No results for input(s): LIPASE, AMYLASE in the last 168 hours. Recent Labs  Lab 10/26/18 2253  AMMONIA 23    Coagulation Profile: No results for input(s): INR, PROTIME in the last 168 hours.  Cardiac Enzymes: No results for input(s): CKTOTAL, CKMB, CKMBINDEX, TROPONINI in the last 168 hours.  BNP (last 3 results) No results for input(s): PROBNP in the last 8760 hours.  HbA1C: Recent Labs    10/27/18 0220  HGBA1C 5.6    CBG: Recent Labs  Lab 10/27/18 0820  GLUCAP 156*    Lipid Profile: No results for input(s): CHOL, HDL, LDLCALC, TRIG, CHOLHDL, LDLDIRECT in the last 72 hours.  Thyroid Function Tests: Recent Labs    10/27/18 0220  TSH 1.272    Anemia Panel: No results for  input(s): VITAMINB12, FOLATE, FERRITIN, TIBC, IRON, RETICCTPCT in the last 72 hours.  Urine analysis:    Component Value Date/Time   COLORURINE YELLOW 10/26/2018 1803   APPEARANCEUR CLEAR 10/26/2018 1803   LABSPEC 1.008 10/26/2018 1803   PHURINE 7.0 10/26/2018 1803   GLUCOSEU NEGATIVE 10/26/2018 1803   HGBUR NEGATIVE 10/26/2018 1803   BILIRUBINUR NEGATIVE 10/26/2018 1803   KETONESUR NEGATIVE 10/26/2018 1803   PROTEINUR NEGATIVE 10/26/2018 1803   UROBILINOGEN 1.0 12/26/2007 0308   NITRITE NEGATIVE 10/26/2018 1803   LEUKOCYTESUR NEGATIVE 10/26/2018 1803    Sepsis Labs: Lactic Acid, Venous    Component Value Date/Time   LATICACIDVEN 0.81 05/03/2017 1902    MICROBIOLOGY: Recent Results (from the past 240 hour(s))  SARS CORONAVIRUS 2 (TAT 6-24 HRS) Nasopharyngeal Nasopharyngeal Swab     Status: None   Collection Time: 10/27/18 12:39 AM   Specimen: Nasopharyngeal Swab  Result Value Ref Range Status   SARS Coronavirus 2 NEGATIVE NEGATIVE Final    Comment: (NOTE) SARS-CoV-2 target nucleic acids are NOT DETECTED. The SARS-CoV-2 RNA is generally detectable in upper and lower respiratory specimens during the acute phase of infection. Negative results do not  preclude SARS-CoV-2 infection, do not rule out co-infections with other pathogens, and should not be used as the sole basis for treatment or other patient management decisions. Negative results must be combined with clinical observations, patient history, and epidemiological information. The expected result is Negative. Fact Sheet for Patients: HairSlick.no Fact Sheet for Healthcare Providers: quierodirigir.com This test is not yet approved or cleared by the [place] FDA and  has been authorized for detection and/or diagnosis of SARS-CoV-2 by FDA under an Emergency Use Authorization (EUA). This EUA will remain  in effect (meaning this test can be used) for the duration of the COVID-19 declaration under Section 56 4(b)(1) of the Act, 21 U.S.C. section 360bbb-3(b)(1), unless the authorization is terminated or revoked sooner. Performed at [facility], [address] [place], [place] [address]     RADIOLOGY STUDIES/RESULTS: Ct Head Wo Contrast  Result Date: 10/26/2018 CLINICAL DATA:  Altered mental status EXAM: CT HEAD WITHOUT CONTRAST TECHNIQUE: Contiguous axial images were obtained from the base of the skull through the vertex without intravenous contrast. COMPARISON:  06/29/2017 FINDINGS: Brain: No evidence of acute infarction, hemorrhage, hydrocephalus, extra-axial collection or mass lesion/mass effect. Unchanged prominent bifrontal extra-axial spaces. Periventricular white matter hypodensity. Vascular: No hyperdense vessel or unexpected calcification. Skull: Normal. Negative for fracture or focal lesion. Sinuses/Orbits: No acute finding. Other: None. IMPRESSION: No acute intracranial pathology.  Small-vessel white matter disease. Electronically Signed   By: Lauralyn Primes M.D.   On: 10/26/2018 20:18   Dg Chest Port 1 View  Result Date: 10/27/2018 CLINICAL DATA:  Acute encephalopathy EXAM: PORTABLE CHEST 1 VIEW COMPARISON:   12/10/2009 FINDINGS: No focal opacity or pleural effusion. Stable cardiomediastinal silhouette with tortuosity at the aortic arch. Aortic atherosclerosis. No pneumothorax. IMPRESSION: No active disease. Electronically Signed   By: Jasmine Pang M.D.   On: 10/27/2018 02:31     LOS: 0 days   Jeoffrey Massed, MD  [facility]  If 7PM-7AM, please contact night-coverage  Please page via [email or web address]  Go to [email or web address] and use [place]'s universal password to access. If you do not have the password, please contact the hospital operator.  Locate the [facility] provider you are looking for under [facility] and page to a number that you can be directly reached. If you still have difficulty reaching the provider,  please page the [facility] (Director on Call) for the Hospitalists listed on amion for assistance.  10/27/2018, 10:29 AM

## 2018-10-27 NOTE — Progress Notes (Signed)
Spoke with RN and she said that attending Doctor does not want anyone to do any test on pt at this time due to agitation. Told RN we will get patient in tomorrow if possible. EEG may require 2 techs

## 2018-10-27 NOTE — Progress Notes (Signed)
Pt was seen for mobility with nursing in attendance, and nursing was given to understand pt is a resident in ALF or SNF.  Her chart information reports she is home alone, and as such recommend she go to a rehab setting to be safer at home.  With pt being cognitively unable to give a history and to follow instructions, she is a higher fall risk.  If family can provide 24/7 assist then pt might be better able to go there.  No clear indication that she has that care available so will plan and ask for SNF level of care.  10/27/18 1100  PT Visit Information  Last PT Received On 10/27/18  Assistance Needed +1  History of Present Illness 85 yo female who was admitted from home per chart after pt was found wandering around outside thinking her home was being attacked by some men.  Pt is per chart in a single family home, has been living alone per her report.  Has acute metabolic encephalopathy, confusion and has many bites over her skin.  PMHx:  DM, HTN, atherosclerosis, anxiety, L TKA   Precautions  Precautions Fall  Precaution Comments pt is a poor historian  Restrictions  Weight Bearing Restrictions No  Other Position/Activity Restrictions pt is able to use a walker but unclear if she used one regularly before  Home Living  Family/patient expects to be discharged to: Private residence  Living Arrangements Alone  Available Help at Discharge  (unclear)  Type of Home House  Home Access Stairs to enter  Entrance Stairs-Number of Steps 4  Entrance Stairs-Rails Can reach both  Home Layout Two level  Additional Comments pt is not able to give a full history  Prior Function  Level of Independence Independent with assistive device(s) (per pt)  Communication  Communication No difficulties  Pain Assessment  Pain Assessment Faces  Faces Pain Scale 4  Pain Location R LBP  Pain Descriptors / Indicators Aching  Cognition  Arousal/Alertness Awake/alert  Behavior During Therapy Impulsive  Overall  Cognitive Status No family/caregiver present to determine baseline cognitive functioning  General Comments pt can give a partial history but cannot fully inform staff of her previous abilities  Upper Extremity Assessment  Upper Extremity Assessment Overall WFL for tasks assessed  Lower Extremity Assessment  Lower Extremity Assessment Generalized weakness  Cervical / Trunk Assessment  Cervical / Trunk Assessment Kyphotic  Bed Mobility  Overal bed mobility Needs Assistance  Bed Mobility Supine to Sit;Sit to Supine  Supine to sit Min assist  Sit to supine Min assist  General bed mobility comments min to support trunk to sit up and min to return legs and trunk to bed, 2 mod to pull up in gurney  Transfers  Overall transfer level Needs assistance  Equipment used Rolling walker (2 wheeled);1 person hand held assist  Transfers Sit to/from Stand  Sit to Stand Min assist  General transfer comment min to power up and help stand  Ambulation/Gait  Ambulation/Gait assistance Min guard  Gait Distance (Feet) 140 Feet  Assistive device Rolling walker (2 wheeled);1 person hand held assist  Gait Pattern/deviations Step-to pattern;Step-through pattern;Decreased stride length;Wide base of support  General Gait Details pt can help move walker but is poorer with turns, tends to get stuck and needs extra space and cues for turning around  Gait velocity reduced  Gait velocity interpretation <1.31 ft/sec, indicative of household ambulator  Balance  Overall balance assessment History of Falls;Needs assistance  Sitting-balance support Feet supported  Sitting balance-Leahy Scale Fair  Standing balance support Bilateral upper extremity supported;During functional activity  Standing balance-Leahy Scale Fair  Standing balance comment less than fair dynamically  General Comments  General comments (skin integrity, edema, etc.) Pt was able to turn and walk with RW but needs cues and assist to maneuver safely.  Pt  is confused about instructions to sit down and scoot back on gurney, took many repetitions to get her to sit and scoot  Exercises  Exercises Other exercises (4- to 4+ LE strength)  PT - End of Session  Equipment Utilized During Treatment Gait belt  Activity Tolerance Patient limited by fatigue;Other (comment);Treatment limited secondary to medical complications (Comment) (confusion to follow instructions)  Patient left in bed;with call bell/phone within reach;with nursing/sitter in room  Nurse Communication Mobility status  PT Assessment  PT Recommendation/Assessment Patient needs continued PT services  PT Visit Diagnosis Muscle weakness (generalized) (M62.81);Other abnormalities of gait and mobility (R26.89);Pain;Difficulty in walking, not elsewhere classified (R26.2);Unsteadiness on feet (R26.81)  Pain - Right/Left Right  Pain - part of body  (back)  PT Problem List Decreased strength;Decreased range of motion;Decreased activity tolerance;Decreased balance;Decreased mobility;Decreased coordination;Decreased cognition;Decreased knowledge of use of DME;Decreased safety awareness;Decreased knowledge of precautions;Cardiopulmonary status limiting activity;Decreased skin integrity;Pain  Barriers to Discharge Inaccessible home environment;Decreased caregiver support  Barriers to Discharge Comments home alone in a multilevel home  PT Plan  PT Frequency (ACUTE ONLY) Min 2X/week  PT Treatment/Interventions (ACUTE ONLY) DME instruction;Gait training;Stair training;Functional mobility training;Therapeutic activities;Therapeutic exercise;Balance training;Neuromuscular re-education;Patient/family education  AM-PAC PT "6 Clicks" Mobility Outcome Measure (Version 2)  Help needed turning from your back to your side while in a flat bed without using bedrails? 3  Help needed moving from lying on your back to sitting on the side of a flat bed without using bedrails? 3  Help needed moving to and from a bed to  a chair (including a wheelchair)? 3  Help needed standing up from a chair using your arms (e.g., wheelchair or bedside chair)? 3  Help needed to walk in hospital room? 3  Help needed climbing 3-5 steps with a railing?  2  6 Click Score 17  Consider Recommendation of Discharge To: Home with [facility]  PT Recommendation  Follow Up Recommendations SNF  PT equipment None recommended by PT  Individuals Consulted  Consulted and Agree with Results and Recommendations Patient unable/family or caregiver not available  Acute Rehab PT Goals  Patient Stated Goal none stated  PT Goal Formulation Patient unable to participate in goal setting  Time For Goal Achievement 11/10/18  Potential to Achieve Goals Good  PT Time Calculation  PT Start Time (ACUTE ONLY) 0854  PT Stop Time (ACUTE ONLY) 0925  PT Time Calculation (min) (ACUTE ONLY) 31 min  PT General Charges  $$ ACUTE PT VISIT 1 Visit  PT Evaluation  $PT Eval Moderate Complexity 1 Mod  PT Treatments  $Gait Training 8-22 mins  Written Expression  Dominant Hand Right    Mee Hives, PT MS Acute Rehab Dept. Number: [place] [facility] [place]

## 2018-10-27 NOTE — ED Notes (Addendum)
Pt has had bowel movement pt unwilling to let staff change her. Pt hitting and attempting to bite staff.

## 2018-10-27 NOTE — ED Notes (Addendum)
Fall pads placed on floor next to bed. Seizure pads place on stretcher to keep patient from putting her legs through the arm rails of the stretcher. Pt covered in stool, not allowing staff to touch her. When staff members attempt to touch patient to reposition or clean her up she becomes combative and hits staff.

## 2018-10-27 NOTE — ED Notes (Signed)
Pt continuing to climb out of bed and hitting at staff and using profanity and racial slurs towards staff

## 2018-10-27 NOTE — ED Notes (Signed)
Pt agitated and attempting to get out of bed. Verbal redirection attempted without success.

## 2018-10-27 NOTE — Progress Notes (Signed)
OT Cancellation Note  Patient Details Name: [name] MRN: [id number] DOB: Jun 20, 1932   Cancelled Treatment:    Reason Eval/Treat Not Completed: Other (comment). Pt is currently combative and unsafe (trying to get OOB, posey on). Spoke with RN and will hold eval today.  [place], OTR/L Acute Rehab Services Pager [phone] Office [phone]     Almon Register 10/27/2018, 1:35 PM

## 2018-10-27 NOTE — ED Notes (Signed)
PAGED TRIAD TO RN ANDREW--[name]

## 2018-10-27 NOTE — ED Notes (Signed)
Ordered breakfast--[name] 

## 2018-10-27 NOTE — ED Notes (Signed)
Report attempted 

## 2018-10-28 DIAGNOSIS — Y929 Unspecified place or not applicable: Secondary | ICD-10-CM | POA: Diagnosis not present

## 2018-10-28 DIAGNOSIS — F05 Delirium due to known physiological condition: Secondary | ICD-10-CM | POA: Diagnosis present

## 2018-10-28 DIAGNOSIS — F0390 Unspecified dementia without behavioral disturbance: Secondary | ICD-10-CM | POA: Diagnosis present

## 2018-10-28 DIAGNOSIS — E038 Other specified hypothyroidism: Secondary | ICD-10-CM | POA: Diagnosis not present

## 2018-10-28 DIAGNOSIS — M549 Dorsalgia, unspecified: Secondary | ICD-10-CM | POA: Diagnosis present

## 2018-10-28 DIAGNOSIS — G20 Parkinson's disease: Secondary | ICD-10-CM | POA: Diagnosis not present

## 2018-10-28 DIAGNOSIS — E782 Mixed hyperlipidemia: Secondary | ICD-10-CM | POA: Diagnosis present

## 2018-10-28 DIAGNOSIS — E1136 Type 2 diabetes mellitus with diabetic cataract: Secondary | ICD-10-CM | POA: Diagnosis present

## 2018-10-28 DIAGNOSIS — R4182 Altered mental status, unspecified: Secondary | ICD-10-CM | POA: Diagnosis not present

## 2018-10-28 DIAGNOSIS — M19042 Primary osteoarthritis, left hand: Secondary | ICD-10-CM | POA: Diagnosis present

## 2018-10-28 DIAGNOSIS — Z8249 Family history of ischemic heart disease and other diseases of the circulatory system: Secondary | ICD-10-CM | POA: Diagnosis not present

## 2018-10-28 DIAGNOSIS — T424X1A Poisoning by benzodiazepines, accidental (unintentional), initial encounter: Secondary | ICD-10-CM | POA: Diagnosis present

## 2018-10-28 DIAGNOSIS — Z888 Allergy status to other drugs, medicaments and biological substances status: Secondary | ICD-10-CM | POA: Diagnosis not present

## 2018-10-28 DIAGNOSIS — Z79890 Hormone replacement therapy (postmenopausal): Secondary | ICD-10-CM | POA: Diagnosis not present

## 2018-10-28 DIAGNOSIS — Z791 Long term (current) use of non-steroidal anti-inflammatories (NSAID): Secondary | ICD-10-CM | POA: Diagnosis not present

## 2018-10-28 DIAGNOSIS — I10 Essential (primary) hypertension: Secondary | ICD-10-CM | POA: Diagnosis present

## 2018-10-28 DIAGNOSIS — F0281 Dementia in other diseases classified elsewhere with behavioral disturbance: Secondary | ICD-10-CM | POA: Diagnosis not present

## 2018-10-28 DIAGNOSIS — E860 Dehydration: Secondary | ICD-10-CM | POA: Diagnosis present

## 2018-10-28 DIAGNOSIS — Z833 Family history of diabetes mellitus: Secondary | ICD-10-CM | POA: Diagnosis not present

## 2018-10-28 DIAGNOSIS — I160 Hypertensive urgency: Secondary | ICD-10-CM | POA: Diagnosis present

## 2018-10-28 DIAGNOSIS — R410 Disorientation, unspecified: Secondary | ICD-10-CM | POA: Diagnosis present

## 2018-10-28 DIAGNOSIS — M19041 Primary osteoarthritis, right hand: Secondary | ICD-10-CM | POA: Diagnosis present

## 2018-10-28 DIAGNOSIS — Z20828 Contact with and (suspected) exposure to other viral communicable diseases: Secondary | ICD-10-CM | POA: Diagnosis present

## 2018-10-28 DIAGNOSIS — G9340 Encephalopathy, unspecified: Secondary | ICD-10-CM | POA: Diagnosis not present

## 2018-10-28 DIAGNOSIS — Z90710 Acquired absence of both cervix and uterus: Secondary | ICD-10-CM | POA: Diagnosis not present

## 2018-10-28 DIAGNOSIS — G92 Toxic encephalopathy: Secondary | ICD-10-CM | POA: Diagnosis present

## 2018-10-28 DIAGNOSIS — G894 Chronic pain syndrome: Secondary | ICD-10-CM | POA: Diagnosis present

## 2018-10-28 DIAGNOSIS — E538 Deficiency of other specified B group vitamins: Secondary | ICD-10-CM | POA: Diagnosis present

## 2018-10-28 DIAGNOSIS — F329 Major depressive disorder, single episode, unspecified: Secondary | ICD-10-CM | POA: Diagnosis present

## 2018-10-28 DIAGNOSIS — F411 Generalized anxiety disorder: Secondary | ICD-10-CM | POA: Diagnosis not present

## 2018-10-28 DIAGNOSIS — Z96652 Presence of left artificial knee joint: Secondary | ICD-10-CM | POA: Diagnosis present

## 2018-10-28 DIAGNOSIS — R451 Restlessness and agitation: Secondary | ICD-10-CM | POA: Diagnosis not present

## 2018-10-28 DIAGNOSIS — E039 Hypothyroidism, unspecified: Secondary | ICD-10-CM | POA: Diagnosis present

## 2018-10-28 DIAGNOSIS — F330 Major depressive disorder, recurrent, mild: Secondary | ICD-10-CM | POA: Diagnosis not present

## 2018-10-28 DIAGNOSIS — X58XXXA Exposure to other specified factors, initial encounter: Secondary | ICD-10-CM | POA: Diagnosis present

## 2018-10-28 LAB — RPR: RPR Ser Ql: NONREACTIVE

## 2018-10-28 LAB — GLUCOSE, CAPILLARY
Glucose-Capillary: 174 mg/dL — ABNORMAL HIGH (ref 70–99)
Glucose-Capillary: 105 mg/dL — ABNORMAL HIGH (ref 70–99)
Glucose-Capillary: 178 mg/dL — ABNORMAL HIGH (ref 70–99)

## 2018-10-28 MED ORDER — QUETIAPINE FUMARATE 25 MG PO TABS
25.0000 mg | ORAL_TABLET | Freq: Every day | ORAL | Status: DC
  Administered 2018-10-28: 09:00:00 25 mg via ORAL
  Filled 2018-10-28: qty 1

## 2018-10-28 MED ORDER — CYANOCOBALAMIN 1000 MCG/ML IJ SOLN
1000.0000 ug | Freq: Every day | INTRAMUSCULAR | Status: DC
  Administered 2018-10-28 – 2018-11-02 (×6): 1000 ug via SUBCUTANEOUS
  Filled 2018-10-28 (×6): qty 1

## 2018-10-28 NOTE — Progress Notes (Addendum)
CSW called patient's son Altamese Dilling and left a  voicemail to complete assessment and discuss disposition planning. CSW also asked for HCPOA to be provided to put on the patient's chart. CSW is awaiting a return phone call.   CSW called the patient's second contact, Alver Fisher. She is the patient's son fiance. She provided the patient's daughter's name and phone number.   CSW will continue to follow and assist disposition planning.   Domenic Schwab, MSW, [place] Worker [facility]  [phone]

## 2018-10-28 NOTE — Progress Notes (Signed)
CSW called patient's daughter, Holley Raring and left a voicemail. CSW is awaiting a return phone call.   CSW will continue to follow and assist with disposition planning.   Domenic Schwab, MSW, [place] Worker [facility]  [phone]

## 2018-10-28 NOTE — TOC Initial Note (Signed)
Transition of Care [facility]) - Initial/Assessment Note    Patient Details  Name: [name] MRN: [id number] Date of Birth: 01/12/1933  Transition of Care [facility]) CM/SW Contact:    Gelene Mink, Happy Phone Number: 10/28/2018, 10:43 AM  Clinical Narrative:                  CSW received a return phone call back from the patient's son, Armani Gawlik. CSW introduced herself and explained her role. CSW shared the therapy recommendation. He stated that he believes that his mother deserves the best of care and he is in agreement of what the doctor/physical therapist is recommending. He stated that his mother should not be living at home anymore. He stated that his sister works and she is left home alone during the day. He stated that he would be open to looking at memory care or assisted living after rehab. He stated that he did not have a preference of any facility. He is open to a [facility] or local placement. CSW obtained permission to fax the patient out and provide bed offers. CSW asked if he had POA paperwork. He stated that he did and would bring a copy of it with him when he visited this afternoon. CSW informed him that his sister stated that she did not wish for the patient to go to a SNF, he stated that he was the primary decision maker and he wanted to go with the best treatment option for his mother. CSW obtained his email and provided the CMS SNF List.   CSW will continue to follow and assist with discharge planning.   Expected Discharge Plan: Skilled Nursing Facility Barriers to Discharge: SNF Pending bed offer, Continued Medical Work up   Patient Goals and CMS Choice Patient states their goals for this hospitalization and ongoing recovery are:: Pt son would like his mother to receive rehab to get her betrer. CMS Medicare.gov Compare Post Acute Care list provided to:: Patient Represenative (must comment) Choice offered to / list presented to : [facility] POA / Guardian, Adult  Children  Expected Discharge Plan and Services Expected Discharge Plan: [place] In-house Referral: Clinical Social Work Discharge Planning Services: NA Post Acute Care Choice: [place] Living arrangements for the past 2 months: Single Family Home                 DME Arranged: N/A DME Agency: NA       HH Arranged: NA [place] Agency: NA        Prior Living Arrangements/Services Living arrangements for the past 2 months: [place] Lives with:: Adult Children Patient language and need for interpreter reviewed:: No Do you feel safe going back to the place where you live?: No   Pt son does not feel comfortable for his mother living independently anymore  Need for Family Participation in Patient Care: Yes (Comment) Care giver support system in place?: Yes (comment)   Criminal Activity/Legal Involvement Pertinent to Current Situation/Hospitalization: No - Comment as needed  Activities of Daily Living      Permission Sought/Granted Permission sought to share information with : Case Manager Permission granted to share information with : Yes, Verbal Permission Granted  Share Information with NAME: Altamese Dilling  Permission granted to share info w AGENCY: All SNF  Permission granted to share info w Relationship: Son/HCPOA     Emotional Assessment Appearance:: Appears stated age Attitude/Demeanor/Rapport: Unable to Assess Affect (typically observed): Unable to Assess Orientation: : Oriented  to Self Alcohol / Substance Use: Not Applicable Psych Involvement: No (comment)  Admission diagnosis:  Disorientation [R41.0] Acute encephalopathy [G93.40] Patient Active Problem List   Diagnosis Date Noted  . Encephalopathy acute 10/28/2018  . Acute encephalopathy 10/27/2018  . Lower GI bleed   . Hypokalemia 05/04/2017  . Syncope, vasovagal 05/04/2017  . Nausea, vomiting, and diarrhea 05/04/2017  . Melena 05/04/2017  . Colitis 05/03/2017  .  Hypothyroidism 10/10/2015  . Adjustment disorder with mixed anxiety and depressed mood 10/10/2015  . Sleeping difficulty 10/10/2015  . Mixed hyperlipidemia 10/10/2015  . Chronic Chest pain- unknwn etiology- noncardiogenic 10/10/2015  . Generalized OA 10/10/2015  . Overweight (BMI 25.0-29.9) 10/10/2015  . History of noncompliance with medical treatment 10/10/2015  . Hypertension 10/08/2015  . Diverticulosis of colon (without mention of hemorrhage) 10/08/2015  . GAD (generalized anxiety disorder) 10/08/2015   PCP:  Aida Puffer, MD Pharmacy:   [facility] [phone] [place], [place] - [address] [place] [address] Phone: [phone] Fax: [phone]     Social Determinants of Health (SDOH) Interventions    Readmission Risk Interventions No flowsheet data found.

## 2018-10-28 NOTE — NC FL2 (Signed)
[place] MEDICAID FL2 LEVEL OF CARE SCREENING TOOL     IDENTIFICATION  Patient Name: [name] Birthdate: 06/20/1932 Sex: female Admission Date (Current Location): 10/26/2018  [facility] and [place] Number:  Producer, television/film/video and Address:  [facility], [address], [place], [place] [address]      Provider Number: [id number]  Attending Physician Name and Address:  Maretta Bees, MD  Relative Name and Phone Number:  Kieu Sankey, Son/HCPOA, [phone]    Current Level of Care: Hospital Recommended Level of Care: Skilled Nursing Facility Prior Approval Number:    Date Approved/Denied:   PASRR Number: [id number]  Discharge Plan: SNF    Current Diagnoses: Patient Active Problem List   Diagnosis Date Noted  . Encephalopathy acute 10/28/2018  . Acute encephalopathy 10/27/2018  . Lower GI bleed   . Hypokalemia 05/04/2017  . Syncope, vasovagal 05/04/2017  . Nausea, vomiting, and diarrhea 05/04/2017  . Melena 05/04/2017  . Colitis 05/03/2017  . Hypothyroidism 10/10/2015  . Adjustment disorder with mixed anxiety and depressed mood 10/10/2015  . Sleeping difficulty 10/10/2015  . Mixed hyperlipidemia 10/10/2015  . Chronic Chest pain- unknwn etiology- noncardiogenic 10/10/2015  . Generalized OA 10/10/2015  . Overweight (BMI 25.0-29.9) 10/10/2015  . History of noncompliance with medical treatment 10/10/2015  . Hypertension 10/08/2015  . Diverticulosis of colon (without mention of hemorrhage) 10/08/2015  . GAD (generalized anxiety disorder) 10/08/2015    Orientation RESPIRATION BLADDER Height & Weight     Self, Place  Normal Incontinent Weight:   Height:     BEHAVIORAL SYMPTOMS/MOOD NEUROLOGICAL BOWEL NUTRITION STATUS      Incontinent Diet(Carb modified diet, thin liquids)  AMBULATORY STATUS COMMUNICATION OF NEEDS Skin   Limited Assist Verbally Skin abrasions(skin abrasion on right arm and left leg)                        Personal Care Assistance Level of Assistance  Bathing, Feeding, Dressing, Total care Bathing Assistance: Limited assistance Feeding assistance: Independent Dressing Assistance: Limited assistance Total Care Assistance: Limited assistance   Functional Limitations Info  Sight, Hearing, Speech Sight Info: Adequate Hearing Info: Adequate Speech Info: Adequate    SPECIAL CARE FACTORS FREQUENCY  PT (By licensed PT), OT (By licensed OT)     PT Frequency: 5x/wk OT Frequency: 5x/wk            Contractures Contractures Info: Not present    Additional Factors Info  Code Status, Allergies, Psychotropic, Insulin Sliding Scale Code Status Info: Full Code Allergies Info: Escitalopram, Meloxicam Psychotropic Info: seroquel 25mg  daily; zoloft 50mg  daily; klonopin .05mg  2x daily Insulin Sliding Scale Info: insulin asapart novolog 0-9 units 3x daily w/meals & insulin aspart novolog 0-5 units at bedtime       Current Medications (10/28/2018):  This is the current hospital active medication list Current Facility-Administered Medications  Medication Dose Route Frequency Provider Last Rate Last Dose  . acetaminophen (TYLENOL) tablet 650 mg  650 mg Oral Q6H PRN Therisa Doyne, MD   650 mg at 10/27/18 0606   Or  . acetaminophen (TYLENOL) suppository 650 mg  650 mg Rectal Q6H PRN Therisa Doyne, MD      . clonazePAM (KLONOPIN) tablet 0.5 mg  0.5 mg Oral BID Maretta Bees, MD   0.5 mg at 10/28/18 0912  . cyanocobalamin ((VITAMIN B-12)) injection 1,000 mcg  1,000 mcg Subcutaneous Daily Maretta Bees, MD   1,000 mcg at 10/28/18 0912  .  enoxaparin (LOVENOX) injection 40 mg  40 mg Subcutaneous Q24H Maretta Bees, MD   40 mg at 10/27/18 1359  . haloperidol lactate (HALDOL) injection 2 mg  2 mg Intravenous Q6H PRN Maretta Bees, MD   2 mg at 10/28/18 0727  . HYDROcodone-acetaminophen (NORCO/VICODIN) 5-325 MG per tablet 1-2 tablet  1-2 tablet Oral Q6H PRN Ghimire, Werner Lean, MD      . insulin aspart (novoLOG) injection 0-5 Units  0-5 Units Subcutaneous QHS Doutova, Anastassia, MD      . insulin aspart (novoLOG) injection 0-9 Units  0-9 Units Subcutaneous TID WC Therisa Doyne, MD   2 Units at 10/28/18 0656  . LORazepam (ATIVAN) injection 1 mg  1 mg Intravenous Q6H PRN Maretta Bees, MD   1 mg at 10/27/18 1147  . losartan (COZAAR) tablet 50 mg  50 mg Oral Daily Maretta Bees, MD   50 mg at 10/28/18 0912  . metoprolol succinate (TOPROL-XL) 24 hr tablet 100 mg  100 mg Oral Daily Maretta Bees, MD   100 mg at 10/28/18 0912  . ondansetron (ZOFRAN) tablet 4 mg  4 mg Oral Q6H PRN Doutova, Anastassia, MD       Or  . ondansetron (ZOFRAN) injection 4 mg  4 mg Intravenous Q6H PRN Doutova, Anastassia, MD      . QUEtiapine (SEROQUEL) tablet 25 mg  25 mg Oral Daily Maretta Bees, MD   25 mg at 10/28/18 0912  . sertraline (ZOLOFT) tablet 50 mg  50 mg Oral Daily Maretta Bees, MD   50 mg at 10/28/18 2841     Discharge Medications: Please see discharge summary for a list of discharge medications.  Relevant Imaging Results:  Relevant Lab Results:   Additional Information SSN: [id number]  Nada Boozer Shawnette Augello, LCSWA

## 2018-10-28 NOTE — Progress Notes (Signed)
PROGRESS NOTE        PATIENT DETAILS Name: [name] Age: 85 y.o. Sex: female Date of Birth: 04/18/1932 Admit Date: 10/26/2018 Admitting Physician Therisa Doyne, MD [id number], Fayrene Fearing, MD  Brief Narrative: Patient is a 85 y.o. female with past medical history of HTN, DM-2, anxiety, chronic pain syndrome on narcotics-probable dementia (see below) found wandering in a wooded area in her neighborhood with a knife along with her dog.  She was found to be very confused and found to have significantly elevated blood pressures.  She was subsequently brought to the emergency room for further evaluation and treatment.  See below for further details.  Subjective: She significant sundowning yesterday from per RN-required Haldol earlier this morning.  She is in two-point restraints this morning.  She is able to tell me her name and her daughter's name but she is much more confused compared to yesterday.  Assessment/Plan: Acute metabolic encephalopathy: Suspect this was secondary to dehydration (out in the woods for a prolonged period of time) and uncontrolled hypertension on a background of probable undiagnosed dementia with concurrent use of narcotics and benzodiazepines.  She has had significant sundowning overnight-we will start scheduled Seroquel and see how she does.  Hypertension: Better controlled-she has been noncompliant to her antihypertensives.  Plan is to continue metoprolol and losartan.  Follow and adjust.    Probable undiagnosed dementia: Long discussion with the patient's daughter over the phone on 10/3-she moved in with her mother approximately 3 years back as she noticed some behavioral and memory issues.  Per daughter it is not unusual for the patient to be confused especially in the mornings.  Sometimes answers are not very fluent.  Patient has had 1 prior incidence of where she has left the house and was found more confused than her baseline.  Patient  also is maintained on Klonopin and hydrocodone for more than a year-which are probably not helping.  Given this history-I suspect patient has had undiagnosed dementia-awaiting MRI brain and EEG.  RPR is pending but her vitamin B12 level significantly low at 252.  We will begin vitamin B12 supplementation.  She has significant sundowning/delirium-starting Seroquel as noted above.   Anxiety: Continue Klonopin.  Per patient's daughter patient has been on Klonopin for more than a year.   Have asked the patient's daughter to talk with PCP to see if she can be slowly titrated off this medication.  Chronic pain syndrome: Patient apparently has been on hydrocodone/acetaminophen combination for more than a year-continue as needed narcotics-have asked patient's daughter to talk with patient's PCP to see if she can be titrated off this medication.  Other issues: Unsafe discharge at this point-daughter works during the daytime during which the patient is all by herself.  Per daughter-patient usually is awake at night-and sleeps during the day while daughter is at work.  Social work evaluation in progress-spoke with social worker this morning-recommending continued inpatient monitoring-as unsafe discharge at this point.  Social work speaking with different family members to come up with appropriate disposition.  Diet: Diet Order            Diet Carb Modified Fluid consistency: Thin; Room service appropriate? Yes  Diet effective now               DVT Prophylaxis: Prophylactic Lovenox  Code Status: Full code   Family Communication:  None at bedside-attempted to contact daughter [phone] voicemail.  Left voicemail for son as well.  Disposition Plan: Remain inpatient-social work evaluation in progress-unsafe discharge at this point.  Antimicrobial agents: Anti-infectives (From admission, onward)   None      Procedures: None  CONSULTS:  None  Time spent: 25 minutes-Greater than 50%  of this time was spent in counseling, explanation of diagnosis, planning of further management, and coordination of care.  MEDICATIONS: Scheduled Meds: . clonazePAM  0.5 mg Oral BID  . cyanocobalamin  1,000 mcg Subcutaneous Daily  . enoxaparin (LOVENOX) injection  40 mg Subcutaneous Q24H  . insulin aspart  0-5 Units Subcutaneous QHS  . insulin aspart  0-9 Units Subcutaneous TID WC  . losartan  50 mg Oral Daily  . metoprolol succinate  100 mg Oral Daily  . QUEtiapine  25 mg Oral Daily  . sertraline  50 mg Oral Daily   Continuous Infusions: PRN Meds:.acetaminophen **OR** acetaminophen, haloperidol lactate, HYDROcodone-acetaminophen, LORazepam, ondansetron **OR** ondansetron (ZOFRAN) IV   PHYSICAL EXAM: Vital signs: Vitals:   10/27/18 2132 10/28/18 0054 10/28/18 0358 10/28/18 0755  BP: 127/85 (!) 161/108 (!) 154/99 (!) 149/94  Pulse: 86 86 (!) 109 (!) 120  Resp: 20     Temp: 97.9 F (36.6 C) 98.1 F (36.7 C) 97.8 F (36.6 C) 98.3 F (36.8 C)  TempSrc: Oral Oral Oral Oral  SpO2: 95% 99% 97% 97%   There were no vitals filed for this visit. There is no height or weight on file to calculate BMI.   Gen Exam: Awake but very confused.   Chest: Bilaterally clear to auscultation CVS: S1-S2 regular Abdomen: Soft nontender nondistended Neurology: Nonfocal  I have personally reviewed following labs and imaging studies  LABORATORY DATA: CBC: Recent Labs  Lab 10/26/18 1803 10/27/18 0220 10/27/18 0302  WBC 8.9 8.6  --   NEUTROABS 7.7  --   --   HGB 13.3 13.4 14.3  HCT 38.3 38.6 42.0  MCV 89.9 89.4  --   PLT 207 228  --     Basic Metabolic Panel: Recent Labs  Lab 10/26/18 1803 10/27/18 0220 10/27/18 0302  NA 136 136 138  K 3.7 3.8 3.6  CL 102 101  --   CO2 24 22  --   GLUCOSE 129* 114*  --   BUN 13 17  --   CREATININE 0.67 0.71  --   CALCIUM 8.5* 9.1  --   MG  --  2.0  --   PHOS  --  3.3  --     GFR: CrCl cannot be calculated (Unknown ideal weight.).   Liver Function Tests: Recent Labs  Lab 10/26/18 1803 10/27/18 0220  AST 20 21  ALT 13 15  ALKPHOS 44 49  BILITOT 0.9 0.7  PROT 6.3* 6.6  ALBUMIN 3.7 4.0   No results for input(s): LIPASE, AMYLASE in the last 168 hours. Recent Labs  Lab 10/26/18 2253  AMMONIA 23    Coagulation Profile: No results for input(s): INR, PROTIME in the last 168 hours.  Cardiac Enzymes: No results for input(s): CKTOTAL, CKMB, CKMBINDEX, TROPONINI in the last 168 hours.  BNP (last 3 results) No results for input(s): PROBNP in the last 8760 hours.  HbA1C: Recent Labs    10/27/18 0220  HGBA1C 5.6    CBG: Recent Labs  Lab 10/27/18 0820 10/27/18 1405 10/27/18 2145  GLUCAP 156* 127* 117*    Lipid Profile: No results for input(s): CHOL, HDL, LDLCALC, TRIG, CHOLHDL,  LDLDIRECT in the last 72 hours.  Thyroid Function Tests: Recent Labs    10/27/18 0220  TSH 1.272    Anemia Panel: Recent Labs    10/27/18 1407  VITAMINB12 252    Urine analysis:    Component Value Date/Time   COLORURINE YELLOW 10/26/2018 1803   APPEARANCEUR CLEAR 10/26/2018 1803   LABSPEC 1.008 10/26/2018 1803   PHURINE 7.0 10/26/2018 1803   GLUCOSEU NEGATIVE 10/26/2018 1803   HGBUR NEGATIVE 10/26/2018 1803   BILIRUBINUR NEGATIVE 10/26/2018 1803   KETONESUR NEGATIVE 10/26/2018 1803   PROTEINUR NEGATIVE 10/26/2018 1803   UROBILINOGEN 1.0 12/26/2007 0308   NITRITE NEGATIVE 10/26/2018 1803   LEUKOCYTESUR NEGATIVE 10/26/2018 1803    Sepsis Labs: Lactic Acid, Venous    Component Value Date/Time   LATICACIDVEN 0.81 05/03/2017 1902    MICROBIOLOGY: Recent Results (from the past 240 hour(s))  SARS CORONAVIRUS 2 (TAT 6-24 HRS) Nasopharyngeal Nasopharyngeal Swab     Status: None   Collection Time: 10/27/18 12:39 AM   Specimen: Nasopharyngeal Swab  Result Value Ref Range Status   SARS Coronavirus 2 NEGATIVE NEGATIVE Final    Comment: (NOTE) SARS-CoV-2 target nucleic acids are NOT DETECTED. The SARS-CoV-2  RNA is generally detectable in upper and lower respiratory specimens during the acute phase of infection. Negative results do not preclude SARS-CoV-2 infection, do not rule out co-infections with other pathogens, and should not be used as the sole basis for treatment or other patient management decisions. Negative results must be combined with clinical observations, patient history, and epidemiological information. The expected result is Negative. Fact Sheet for Patients: HairSlick.nohttps://www.fda.gov/media/138098/download Fact Sheet for Healthcare Providers: quierodirigir.comhttps://www.fda.gov/media/138095/download This test is not yet approved or cleared by the [place] FDA and  has been authorized for detection and/or diagnosis of SARS-CoV-2 by FDA under an Emergency Use Authorization (EUA). This EUA will remain  in effect (meaning this test can be used) for the duration of the COVID-19 declaration under Section 56 4(b)(1) of the Act, 21 U.S.C. section 360bbb-3(b)(1), unless the authorization is terminated or revoked sooner. Performed at [facility], [address] [place], [place] [address]     RADIOLOGY STUDIES/RESULTS: Ct Head Wo Contrast  Result Date: 10/26/2018 CLINICAL DATA:  Altered mental status EXAM: CT HEAD WITHOUT CONTRAST TECHNIQUE: Contiguous axial images were obtained from the base of the skull through the vertex without intravenous contrast. COMPARISON:  06/29/2017 FINDINGS: Brain: No evidence of acute infarction, hemorrhage, hydrocephalus, extra-axial collection or mass lesion/mass effect. Unchanged prominent bifrontal extra-axial spaces. Periventricular white matter hypodensity. Vascular: No hyperdense vessel or unexpected calcification. Skull: Normal. Negative for fracture or focal lesion. Sinuses/Orbits: No acute finding. Other: None. IMPRESSION: No acute intracranial pathology.  Small-vessel white matter disease. Electronically Signed   By: Lauralyn PrimesAlex  Bibbey M.D.   On: 10/26/2018  20:18   Dg Chest Port 1 View  Result Date: 10/27/2018 CLINICAL DATA:  Acute encephalopathy EXAM: PORTABLE CHEST 1 VIEW COMPARISON:  12/10/2009 FINDINGS: No focal opacity or pleural effusion. Stable cardiomediastinal silhouette with tortuosity at the aortic arch. Aortic atherosclerosis. No pneumothorax. IMPRESSION: No active disease. Electronically Signed   By: Jasmine PangKim  Fujinaga M.D.   On: 10/27/2018 02:31     LOS: 0 days   Jeoffrey MassedShanker , MD  [facility]  If 7PM-7AM, please contact night-coverage  Please page via [email or web address]  Go to [email or web address] and use [facility] universal password to access. If you do not have the password, please contact the hospital operator.  Locate the [facility] provider you  are looking for under [facility] and page to a number that you can be directly reached. If you still have difficulty reaching the provider, please page the [facility] (Director on Call) for the Hospitalists listed on amion for assistance.  10/28/2018, 10:11 AM

## 2018-10-29 ENCOUNTER — Inpatient Hospital Stay (HOSPITAL_COMMUNITY): Payer: Medicare Other

## 2018-10-29 DIAGNOSIS — F330 Major depressive disorder, recurrent, mild: Secondary | ICD-10-CM | POA: Diagnosis present

## 2018-10-29 DIAGNOSIS — R451 Restlessness and agitation: Secondary | ICD-10-CM

## 2018-10-29 DIAGNOSIS — F0390 Unspecified dementia without behavioral disturbance: Secondary | ICD-10-CM | POA: Insufficient documentation

## 2018-10-29 DIAGNOSIS — F0391 Unspecified dementia with behavioral disturbance: Secondary | ICD-10-CM | POA: Insufficient documentation

## 2018-10-29 LAB — GLUCOSE, CAPILLARY
Glucose-Capillary: 168 mg/dL — ABNORMAL HIGH (ref 70–99)
Glucose-Capillary: 157 mg/dL — ABNORMAL HIGH (ref 70–99)
Glucose-Capillary: 115 mg/dL — ABNORMAL HIGH (ref 70–99)
Glucose-Capillary: 95 mg/dL (ref 70–99)
Glucose-Capillary: 131 mg/dL — ABNORMAL HIGH (ref 70–99)

## 2018-10-29 MED ORDER — DIVALPROEX SODIUM 125 MG PO CSDR
125.0000 mg | DELAYED_RELEASE_CAPSULE | Freq: Two times a day (BID) | ORAL | Status: DC
  Administered 2018-10-29 – 2018-10-31 (×4): 125 mg via ORAL
  Filled 2018-10-29 (×4): qty 1

## 2018-10-29 MED ORDER — CLONAZEPAM 0.25 MG PO TBDP
0.2500 mg | ORAL_TABLET | Freq: Every day | ORAL | Status: DC
  Administered 2018-10-30 – 2018-10-31 (×2): 0.25 mg via ORAL
  Filled 2018-10-29 (×2): qty 1

## 2018-10-29 MED ORDER — CLONAZEPAM 0.5 MG PO TABS
0.5000 mg | ORAL_TABLET | Freq: Every day | ORAL | Status: DC
  Administered 2018-10-30: 0.5 mg via ORAL
  Filled 2018-10-29: qty 1

## 2018-10-29 MED ORDER — QUETIAPINE FUMARATE 25 MG PO TABS
25.0000 mg | ORAL_TABLET | Freq: Every day | ORAL | Status: DC
  Administered 2018-10-30 – 2018-11-01 (×3): 25 mg via ORAL
  Filled 2018-10-29 (×3): qty 1

## 2018-10-29 MED ORDER — QUETIAPINE FUMARATE 25 MG PO TABS
25.0000 mg | ORAL_TABLET | Freq: Two times a day (BID) | ORAL | Status: DC
  Filled 2018-10-29: qty 1

## 2018-10-29 MED ORDER — QUETIAPINE FUMARATE 25 MG PO TABS: 25.0000 mg | ORAL_TABLET | Freq: Every day | ORAL | Status: DC

## 2018-10-29 MED ORDER — CLONAZEPAM 0.5 MG PO TABS: 0.2500 mg | ORAL_TABLET | Freq: Every day | ORAL | Status: DC

## 2018-10-29 NOTE — Progress Notes (Signed)
OT Cancellation Note  Patient Details Name: [name] MRN: [id number] DOB: January 27, 1932   Cancelled Treatment:    Reason Eval/Treat Not Completed: Fatigue/lethargy limiting ability to participate pt did not awake to stimuli, possibly due to medication, per RN plan for pt to go to MRI later. OT will continue to follow at attempt as appropriate and as time allows.   Dorinda Hill OTR/L Acute Rehabilitation Services Office: [place] 10/29/2018, 9:05 AM

## 2018-10-29 NOTE — Progress Notes (Signed)
EEG complete - results pending 

## 2018-10-29 NOTE — TOC Progression Note (Signed)
Transition of Care [facility]) - Progression Note    Patient Details  Name: [name] MRN: [id number] Date of Birth: [birth year]  [facility]) CM/SW Contact  Pollie Friar, RN Phone Number: 10/29/2018, 4:28 PM  Clinical Narrative:    CM spoke to patients son Altamese Dilling and provided him the bed offers. He selected [facility] but they declined. He then selected [facility]. They have offered a bed. If patient able to d/c tomorrow she will not need a new covid. If she discharges later than tomorrow she will need an updated covid test. MD aware. TOC following.   Expected Discharge Plan: [place] Barriers to Discharge: SNF Pending bed offer, Continued Medical Work up  Expected Discharge Plan and Services Expected Discharge Plan: [place] In-house Referral: Clinical Social Work Discharge Planning Services: NA Post Acute Care Choice: [place] Living arrangements for the past 2 months: Single Family Home                 DME Arranged: N/A DME Agency: NA       HH Arranged: NA HH Agency: NA         Social Determinants of Health (SDOH) Interventions    Readmission Risk Interventions No flowsheet data found.
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
## 2018-10-30 DIAGNOSIS — G9340 Encephalopathy, unspecified: Secondary | ICD-10-CM

## 2018-10-30 DIAGNOSIS — R4182 Altered mental status, unspecified: Secondary | ICD-10-CM

## 2018-10-30 LAB — GLUCOSE, CAPILLARY
Glucose-Capillary: 126 mg/dL — ABNORMAL HIGH (ref 70–99)
Glucose-Capillary: 136 mg/dL — ABNORMAL HIGH (ref 70–99)
Glucose-Capillary: 148 mg/dL — ABNORMAL HIGH (ref 70–99)
Glucose-Capillary: 125 mg/dL — ABNORMAL HIGH (ref 70–99)
Glucose-Capillary: 120 mg/dL — ABNORMAL HIGH (ref 70–99)

## 2018-10-30 MED ORDER — WHITE PETROLATUM EX OINT
TOPICAL_OINTMENT | CUTANEOUS | Status: AC
  Administered 2018-10-30: 08:00:00
  Filled 2018-10-30: qty 28.35

## 2018-10-30 NOTE — Procedures (Addendum)
Patient Name: SHERMA VANMETRE  MRN: [id number]  Epilepsy Attending: Lora Havens  Referring Physician/Provider: Dr. Oren Binet Date: 10/29/2018 Duration: 26.40 minutes  Patient history: 85 year old female with altered mental status.  EEG to evaluate for seizures.  Level of alertness: Awake, asleep  AEDs during EEG study: Depakote  Technical aspects: This EEG study was done with scalp electrodes positioned according to the 10-20 International system of electrode placement. Electrical activity was acquired at a sampling rate of 500Hz  and reviewed with a high frequency filter of 70Hz  and a low frequency filter of 1Hz . EEG data were recorded continuously and digitally stored.   Description: During awake state,the posterior dominant rhythm consists of 8-9 Hz activity of moderate voltage (25-35 uV) seen predominantly in posterior head regions, symmetric and reactive to eye opening and eye closing.  During sleep, vertex waves, maximal frontocentral and generalized theta-delta slow-wave sleep was seen.  Physiologic photic driving was not seen during photic stimulation.  Hyperventilation was not performed.  IMPRESSION: This study is within normal limits.  No seizures or epileptiform discharges were seen throughout the recording.  Elicia Lui Barbra Sarks
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
## 2018-10-30 NOTE — Progress Notes (Signed)
PROGRESS NOTE        PATIENT DETAILS Name: [name] Age: 85 y.o. Sex: female Date of Birth: 14-Jan-1933 Admit Date: 10/26/2018 Admitting Physician Therisa DoyneAnastassia Doutova, MD ZOX:[id number]:Little, Fayrene FearingJames, MD  Brief Narrative: Patient is a 85 y.o. female with past medical history of HTN, DM-2, anxiety, chronic pain syndrome on narcotics-probable dementia (see below) found wandering in a wooded area in her neighborhood with a knife along with her dog.  She was found to be very confused and found to have significantly elevated blood pressures.  She was subsequently brought to the emergency room for further evaluation and treatment.  See below for further details.  Subjective: She is much more awake and alert-answers most of my questions appropriately.  She claims that on the day of admission-she saw some men and a few girls in her house-they were talking inappropriately about her dog and her-that is why she claims that she left the house with a dog.  Assessment/Plan: Acute metabolic encephalopathy: Suspect this was secondary to dehydration (out in the woods for a prolonged period of time) and uncontrolled hypertension on a background of probable undiagnosed dementia with concurrent use of narcotics and benzodiazepines.  Hospital course was complicated by development of delirium/sundowning-after medication adjustment-psych consultation-she is much better today.  Hypertension: BP controlled-continue metoprolol and losartan.  Per patient's daughter who I spoke to on initial part of her hospital stay-she has been noncompliant with antihypertensives.   Probable undiagnosed dementia: Long discussion with the patient's daughter over the phone on 10/3-she moved in with her mother approximately 3 years back as she noticed some behavioral and memory issues.  Per daughter it is not unusual for the patient to be confused especially in the mornings.  Sometimes answers are not very fluent.  Patient  has had 1 prior incidence of where she has left the house and was found more confused than her baseline.  Patient also is maintained on Klonopin and hydrocodone for more than a year-which are probably not helping.  Given this history-I suspect patient has had undiagnosed dementia.  RPR nonreactive, but her vitamin B12 levels are significantly low-we have started B12 supplementation.  MRI brain without any acute abnormalities.  Since she has improved-I suspect she further work-up for dementia/neurology evaluation can be done in the outpatient setting.  Still awaiting EEG.  Vitamin B12 deficiency: See above  Anxiety: Continue Klonopin.  Per patient's daughter patient has been on Klonopin for more than a year.   Have asked the patient's daughter to talk with PCP to see if she can be slowly titrated off this medication.  Chronic pain syndrome: Patient apparently has been on hydrocodone/acetaminophen combination for more than a year-continue as needed narcotics-have asked patient's daughter to talk with patient's PCP to see if she can be titrated off this medication.  Other issues: Unsafe discharge at this point-daughter works during the daytime during which the patient is all by herself.  Per daughter-patient usually is awake at night-and sleeps during the day while daughter is at work.  Per social work-son is the POA-and he is requesting placement.  Social work currently following-if patient remains clinically improved-suspect she can probably be discharged in the next day or so.  Will order COVID PCR today in anticipation of discharge to SNF  Diet: Diet Order  Diet Carb Modified Fluid consistency: Thin; Room service appropriate? No  Diet effective now               DVT Prophylaxis: Prophylactic Lovenox  Code Status: Full code   Family Communication: Spoke with patient's son Loraine Leriche over the phone.   Disposition Plan: Remain inpatient-social work evaluation in progress-unsafe  discharge at this point.  Antimicrobial agents: Anti-infectives (From admission, onward)   None      Procedures: None  CONSULTS:  Psychiatry  Time spent: 25 minutes-Greater than 50% of this time was spent in counseling, explanation of diagnosis, planning of further management, and coordination of care.  MEDICATIONS: Scheduled Meds: . clonazepam  0.25 mg Oral Daily  . clonazePAM  0.5 mg Oral QHS  . cyanocobalamin  1,000 mcg Subcutaneous Daily  . divalproex  125 mg Oral Q12H  . enoxaparin (LOVENOX) injection  40 mg Subcutaneous Q24H  . insulin aspart  0-5 Units Subcutaneous QHS  . insulin aspart  0-9 Units Subcutaneous TID WC  . losartan  50 mg Oral Daily  . metoprolol succinate  100 mg Oral Daily  . QUEtiapine  25 mg Oral QHS  . sertraline  50 mg Oral Daily  . white petrolatum       Continuous Infusions: PRN Meds:.acetaminophen **OR** acetaminophen, haloperidol lactate, HYDROcodone-acetaminophen, LORazepam, ondansetron **OR** ondansetron (ZOFRAN) IV   PHYSICAL EXAM: Vital signs: Vitals:   10/29/18 2000 10/29/18 2339 10/30/18 0333 10/30/18 0811  BP: 120/68 110/73 110/75 126/79  Pulse: 84 97 90 (!) 103  Resp: 14 18 18 18   Temp: 97.9 F (36.6 C) 97.6 F (36.4 C) 98.4 F (36.9 C) 98.1 F (36.7 C)  TempSrc: Oral Oral Axillary Oral  SpO2: 95% 97% 97% 96%   There were no vitals filed for this visit. There is no height or weight on file to calculate BMI.   Gen Exam:Alert awake-not in any distress HEENT:atraumatic, normocephalic Chest: B/L clear to auscultation anteriorly CVS:S1S2 regular Abdomen:soft non tender, non distended Extremities:no edema Neurology: Non focal Skin: no rash  I have personally reviewed following labs and imaging studies  LABORATORY DATA: CBC: Recent Labs  Lab 10/26/18 1803 10/27/18 0220 10/27/18 0302  WBC 8.9 8.6  --   NEUTROABS 7.7  --   --   HGB 13.3 13.4 14.3  HCT 38.3 38.6 42.0  MCV 89.9 89.4  --   PLT 207 228  --      Basic Metabolic Panel: Recent Labs  Lab 10/26/18 1803 10/27/18 0220 10/27/18 0302  NA 136 136 138  K 3.7 3.8 3.6  CL 102 101  --   CO2 24 22  --   GLUCOSE 129* 114*  --   BUN 13 17  --   CREATININE 0.67 0.71  --   CALCIUM 8.5* 9.1  --   MG  --  2.0  --   PHOS  --  3.3  --     GFR: CrCl cannot be calculated (Unknown ideal weight.).  Liver Function Tests: Recent Labs  Lab 10/26/18 1803 10/27/18 0220  AST 20 21  ALT 13 15  ALKPHOS 44 49  BILITOT 0.9 0.7  PROT 6.3* 6.6  ALBUMIN 3.7 4.0   No results for input(s): LIPASE, AMYLASE in the last 168 hours. Recent Labs  Lab 10/26/18 2253  AMMONIA 23    Coagulation Profile: No results for input(s): INR, PROTIME in the last 168 hours.  Cardiac Enzymes: No results for input(s): CKTOTAL, CKMB, CKMBINDEX, TROPONINI in the last  168 hours.  BNP (last 3 results) No results for input(s): PROBNP in the last 8760 hours.  HbA1C: No results for input(s): HGBA1C in the last 72 hours.  CBG: Recent Labs  Lab 10/29/18 1126 10/29/18 1819 10/29/18 2149 10/30/18 0634 10/30/18 0802  GLUCAP 115* 95 131* 126* 136*    Lipid Profile: No results for input(s): CHOL, HDL, LDLCALC, TRIG, CHOLHDL, LDLDIRECT in the last 72 hours.  Thyroid Function Tests: No results for input(s): TSH, T4TOTAL, FREET4, T3FREE, THYROIDAB in the last 72 hours.  Anemia Panel: Recent Labs    10/27/18 1407  VITAMINB12 252    Urine analysis:    Component Value Date/Time   COLORURINE YELLOW 10/26/2018 1803   APPEARANCEUR CLEAR 10/26/2018 1803   LABSPEC 1.008 10/26/2018 1803   PHURINE 7.0 10/26/2018 1803   GLUCOSEU NEGATIVE 10/26/2018 1803   HGBUR NEGATIVE 10/26/2018 1803   BILIRUBINUR NEGATIVE 10/26/2018 1803   KETONESUR NEGATIVE 10/26/2018 1803   PROTEINUR NEGATIVE 10/26/2018 1803   UROBILINOGEN 1.0 12/26/2007 0308   NITRITE NEGATIVE 10/26/2018 1803   LEUKOCYTESUR NEGATIVE 10/26/2018 1803    Sepsis Labs: Lactic Acid, Venous    Component  Value Date/Time   LATICACIDVEN 0.81 05/03/2017 1902    MICROBIOLOGY: Recent Results (from the past 240 hour(s))  SARS CORONAVIRUS 2 (TAT 6-24 HRS) Nasopharyngeal Nasopharyngeal Swab     Status: None   Collection Time: 10/27/18 12:39 AM   Specimen: Nasopharyngeal Swab  Result Value Ref Range Status   SARS Coronavirus 2 NEGATIVE NEGATIVE Final    Comment: (NOTE) SARS-CoV-2 target nucleic acids are NOT DETECTED. The SARS-CoV-2 RNA is generally detectable in upper and lower respiratory specimens during the acute phase of infection. Negative results do not preclude SARS-CoV-2 infection, do not rule out co-infections with other pathogens, and should not be used as the sole basis for treatment or other patient management decisions. Negative results must be combined with clinical observations, patient history, and epidemiological information. The expected result is Negative. Fact Sheet for Patients: HairSlick.no Fact Sheet for Healthcare Providers: quierodirigir.com This test is not yet approved or cleared by the [place] FDA and  has been authorized for detection and/or diagnosis of SARS-CoV-2 by FDA under an Emergency Use Authorization (EUA). This EUA will remain  in effect (meaning this test can be used) for the duration of the COVID-19 declaration under Section 56 4(b)(1) of the Act, 21 U.S.C. section 360bbb-3(b)(1), unless the authorization is terminated or revoked sooner. Performed at [facility], [address] [place], [place] [address]     RADIOLOGY STUDIES/RESULTS: Ct Head Wo Contrast  Result Date: 10/26/2018 CLINICAL DATA:  Altered mental status EXAM: CT HEAD WITHOUT CONTRAST TECHNIQUE: Contiguous axial images were obtained from the base of the skull through the vertex without intravenous contrast. COMPARISON:  06/29/2017 FINDINGS: Brain: No evidence of acute infarction, hemorrhage, hydrocephalus,  extra-axial collection or mass lesion/mass effect. Unchanged prominent bifrontal extra-axial spaces. Periventricular white matter hypodensity. Vascular: No hyperdense vessel or unexpected calcification. Skull: Normal. Negative for fracture or focal lesion. Sinuses/Orbits: No acute finding. Other: None. IMPRESSION: No acute intracranial pathology.  Small-vessel white matter disease. Electronically Signed   By: Lauralyn Primes M.D.   On: 10/26/2018 20:18   Mr Brain Wo Contrast  Result Date: 10/29/2018 CLINICAL DATA:  Altered mental status EXAM: MRI HEAD WITHOUT CONTRAST TECHNIQUE: Multiplanar, multiecho pulse sequences of the brain and surrounding structures were obtained without intravenous contrast. COMPARISON:  Head CT from 3 days ago.  Brain MRI 03/20/2009 FINDINGS:  Brain: No acute infarction, hemorrhage, hydrocephalus, extra-axial collection or mass lesion. Generalized atrophy in keeping with age. Minimal small vessel ischemic type change in the white matter, less than usually seen by the eighth decade. Vascular: Normal flow voids Skull and upper cervical spine: Degenerative facet spurring and C3-4 advanced disc narrowing. C4-5 auto fusion or ACDF, partially covered. Sclerotic foci in the left parietal bone and right paramedian frontal bone, essentially stable from 2011 and thus benign Sinuses/Orbits: Bilateral cataract resection.  No acute finding IMPRESSION: Aging brain without acute superimposed finding. Electronically Signed   By: Monte Fantasia M.D.   On: 10/29/2018 11:06   Dg Chest Port 1 View  Result Date: 10/27/2018 CLINICAL DATA:  Acute encephalopathy EXAM: PORTABLE CHEST 1 VIEW COMPARISON:  12/10/2009 FINDINGS: No focal opacity or pleural effusion. Stable cardiomediastinal silhouette with tortuosity at the aortic arch. Aortic atherosclerosis. No pneumothorax. IMPRESSION: No active disease. Electronically Signed   By: Donavan Foil M.D.   On: 10/27/2018 02:31     LOS: 2 days   Oren Binet,  MD  [facility]  If 7PM-7AM, please contact night-coverage  Please page via [email or web address]  Go to [email or web address] and use 's universal password to access. If you do not have the password, please contact the hospital operator.  Locate the [facility] provider you are looking for under [facility] and page to a number that you can be directly reached. If you still have difficulty reaching the provider, please page the [facility] (Director on Call) for the Hospitalists listed on amion for assistance.  10/30/2018, 10:25 AM

## 2018-10-31 LAB — GLUCOSE, CAPILLARY
Glucose-Capillary: 103 mg/dL — ABNORMAL HIGH (ref 70–99)
Glucose-Capillary: 140 mg/dL — ABNORMAL HIGH (ref 70–99)
Glucose-Capillary: 110 mg/dL — ABNORMAL HIGH (ref 70–99)
Glucose-Capillary: 184 mg/dL — ABNORMAL HIGH (ref 70–99)

## 2018-10-31 MED ORDER — CLONAZEPAM 0.25 MG PO TBDP
0.2500 mg | ORAL_TABLET | Freq: Two times a day (BID) | ORAL | Status: DC
  Administered 2018-10-31 – 2018-11-02 (×4): 0.25 mg via ORAL
  Filled 2018-10-31 (×4): qty 1

## 2018-10-31 NOTE — Progress Notes (Addendum)
PROGRESS NOTE    [name]  XNT:[id number] DOB: 03/13/1932 DOA: 10/26/2018 PCP: Aida Puffer, MD    Brief Narrative:  [name] is a 85 y.o. F with HTN, DM and chronic pain on daily opiates who presented after being found in the woods wandering with her dog and a knife.  In the ER, CT head unremarkable, afebrile, CBC and CMP normal.  UA, ammonia, TSH and EtOH normal.  Admitted for acute encephalopathy.      Assessment & Plan:  Acute metabolic encephalopathy superimposed on suspected dementia At baseline, patient lives with daughter for the last 3 years, since she started to demonstrate "behavioral and memory issues".  She is oriented to self, place and situation usually, independent with self-cares, but occasionally confused, especially in the morning.  Unfortunately, in the last year, she has been prescribed daily opiates and benzodiazepines.  Here, her CT head and MRI brain were unremarkable, RPR nonreactive, B12 low and repleted.  EEG normal.  Her attention, alertness and cognition improved with therapy and IV fluids suggesting some of her initial confusion was dehydration.   She has improved significantly, but has some residual psychomotor slowing, unsafe gait, residual confusion. -PT eval   Hypertensive urgency BP well controlled -Continue losartan, metoprolol  Diabetes Glucoses well controlled with minimal SSI -Continue SSI corrections  Vitamin B12 deficiency -Continue B12 injections 1000 mcg daily until 10/11  Anxiety -Continue sertraline -Continue new Seroquel -Stop Depakote -Start Klonopin taper  Chronic pain -Continue Norco, needs taper       DVT prophylaxis: Lovenox  Code Status: Full Code  Family Communication: Attempted to call son [name]), message left for return call 10/7.   Medical decision making The below labs and imaging reports reviewed and summarized above.  Medication management as above.  The patient is independent with self-cares  at baseline, interactive, and lives with her daughter, taking care of all ADLs independently.  However currently she requires assistance for transfers, ambulation, and has new cognitive deficits, that appear to maybe be resolving.  She remains significantly altered from baseline but appears to be making some progress in cognition/resolution of her encephalopathy.  We will ask therapy to reevaluate her, and hopefully can find a safe discharge plan to home or to skilled nursing facility within the next 24 to 48 hours.    Consultants:   Psychiatry   Procedures:     Antimicrobials:       Subjective: Pt denies acute complaints, reports she does not want to go to rehab.  RN reports pt walked 250 feet yesterday.    Objective: Vitals:   10/30/18 1629 10/30/18 1917 10/30/18 2300 10/31/18 0310  BP: 114/76 112/68 120/73 122/61  Pulse: 77 80 79 77  Resp: 16 18 18 18   Temp: 98 F (36.7 C) 98 F (36.7 C) 98.1 F (36.7 C) 98 F (36.7 C)  TempSrc: Oral Oral Oral Oral  SpO2: 95% 95% 96% 97%    Intake/Output Summary (Last 24 hours) at 10/31/2018 0737 Last data filed at 10/30/2018 2331 Gross per 24 hour  Intake 665 ml  Output 600 ml  Net 65 ml   There were no vitals filed for this visit.  Examination: General appearance: elderly adult female, alert and in no acute distress.   HEENT: Anicteric, conjunctiva pink, lids and lashes normal. No nasal deformity, discharge, epistaxis.  Lips moist.   Skin: Warm and dry.  No suspicious rashes or lesions. Cardiac: RRR, nl S1-S2, no murmurs appreciated.  Capillary refill is  brisk, <3 sec.  JVP normal / not visible.  No LE edema.  Radial pulses 2+ and symmetric. Respiratory: Normal respiratory rate and rhythm.  CTAB without rales or wheezes. Abdomen: Abdomen soft.  Non-tender to palpation. No ascites, distension, hepatosplenomegaly.   MSK: No deformities or effusions. Neuro: Awake and alert. Oriented to person, place, events, time.  EOMI, moves  all extremities. Speech fluent.   Psych: Sensorium intact and responding to questions, attention normal. Affect flat.  Judgment and insight appear normal at this point during admit.   Data Reviewed: I have personally reviewed following labs and imaging studies:  CBC: Recent Labs  Lab 10/26/18 1803 10/27/18 0220 10/27/18 0302  WBC 8.9 8.6  --   NEUTROABS 7.7  --   --   HGB 13.3 13.4 14.3  HCT 38.3 38.6 42.0  MCV 89.9 89.4  --   PLT 207 228  --    Basic Metabolic Panel: Recent Labs  Lab 10/26/18 1803 10/27/18 0220 10/27/18 0302  NA 136 136 138  K 3.7 3.8 3.6  CL 102 101  --   CO2 24 22  --   GLUCOSE 129* 114*  --   BUN 13 17  --   CREATININE 0.67 0.71  --   CALCIUM 8.5* 9.1  --   MG  --  2.0  --   PHOS  --  3.3  --    GFR: CrCl cannot be calculated (Unknown ideal weight.). Liver Function Tests: Recent Labs  Lab 10/26/18 1803 10/27/18 0220  AST 20 21  ALT 13 15  ALKPHOS 44 49  BILITOT 0.9 0.7  PROT 6.3* 6.6  ALBUMIN 3.7 4.0   No results for input(s): LIPASE, AMYLASE in the last 168 hours. Recent Labs  Lab 10/26/18 2253  AMMONIA 23   Coagulation Profile: No results for input(s): INR, PROTIME in the last 168 hours.   Cardiac Enzymes: No results for input(s): CKTOTAL, CKMB, CKMBINDEX, TROPONINI in the last 168 hours.   BNP (last 3 results) No results for input(s): PROBNP in the last 8760 hours.   HbA1C: No results for input(s): HGBA1C in the last 72 hours.   CBG: Recent Labs  Lab 10/30/18 0802 10/30/18 1217 10/30/18 1624 10/30/18 2127 10/31/18 0601  GLUCAP 136* 148* 125* 120* 103*   Lipid Profile: No results for input(s): CHOL, HDL, LDLCALC, TRIG, CHOLHDL, LDLDIRECT in the last 72 hours.   Thyroid Function Tests: No results for input(s): TSH, T4TOTAL, FREET4, T3FREE, THYROIDAB in the last 72 hours.   Anemia Panel: No results for input(s): VITAMINB12, FOLATE, FERRITIN, TIBC, IRON, RETICCTPCT in the last 72 hours.   Urine analysis:     Component Value Date/Time   COLORURINE YELLOW 10/26/2018 1803   APPEARANCEUR CLEAR 10/26/2018 1803   LABSPEC 1.008 10/26/2018 1803   PHURINE 7.0 10/26/2018 1803   GLUCOSEU NEGATIVE 10/26/2018 1803   HGBUR NEGATIVE 10/26/2018 1803   BILIRUBINUR NEGATIVE 10/26/2018 1803   KETONESUR NEGATIVE 10/26/2018 1803   PROTEINUR NEGATIVE 10/26/2018 1803   UROBILINOGEN 1.0 12/26/2007 0308   NITRITE NEGATIVE 10/26/2018 1803   LEUKOCYTESUR NEGATIVE 10/26/2018 1803   Sepsis Labs: @LABRCNTIP (procalcitonin:4,lacticacidven:4)  ) Recent Results (from the past 240 hour(s))  SARS CORONAVIRUS 2 (TAT 6-24 HRS) Nasopharyngeal Nasopharyngeal Swab     Status: None   Collection Time: 10/27/18 12:39 AM   Specimen: Nasopharyngeal Swab  Result Value Ref Range Status   SARS Coronavirus 2 NEGATIVE NEGATIVE Final    Comment: (NOTE) SARS-CoV-2 target nucleic acids are NOT  DETECTED. The SARS-CoV-2 RNA is generally detectable in upper and lower respiratory specimens during the acute phase of infection. Negative results do not preclude SARS-CoV-2 infection, do not rule out co-infections with other pathogens, and should not be used as the sole basis for treatment or other patient management decisions. Negative results must be combined with clinical observations, patient history, and epidemiological information. The expected result is Negative. Fact Sheet for Patients: SugarRoll.be Fact Sheet for Healthcare Providers: https://www.woods-mathews.com/ This test is not yet approved or cleared by the [place] FDA and  has been authorized for detection and/or diagnosis of SARS-CoV-2 by FDA under an Emergency Use Authorization (EUA). This EUA will remain  in effect (meaning this test can be used) for the duration of the COVID-19 declaration under Section 56 4(b)(1) of the Act, 21 U.S.C. section 360bbb-3(b)(1), unless the authorization is terminated or revoked sooner.  Performed at [facility], [address], [place], [place] [address]      Radiology Studies: Mr Brain 41 Contrast  Result Date: 10/29/2018 CLINICAL DATA:  Altered mental status EXAM: MRI HEAD WITHOUT CONTRAST TECHNIQUE: Multiplanar, multiecho pulse sequences of the brain and surrounding structures were obtained without intravenous contrast. COMPARISON:  Head CT from 3 days ago.  Brain MRI 03/20/2009 FINDINGS: Brain: No acute infarction, hemorrhage, hydrocephalus, extra-axial collection or mass lesion. Generalized atrophy in keeping with age. Minimal small vessel ischemic type change in the white matter, less than usually seen by the eighth decade. Vascular: Normal flow voids Skull and upper cervical spine: Degenerative facet spurring and C3-4 advanced disc narrowing. C4-5 auto fusion or ACDF, partially covered. Sclerotic foci in the left parietal bone and right paramedian frontal bone, essentially stable from 2011 and thus benign Sinuses/Orbits: Bilateral cataract resection.  No acute finding IMPRESSION: Aging brain without acute superimposed finding. Electronically Signed   By: Monte Fantasia M.D.   On: 10/29/2018 11:06    Scheduled Meds: . clonazepam  0.25 mg Oral Daily  . clonazePAM  0.5 mg Oral QHS  . cyanocobalamin  1,000 mcg Subcutaneous Daily  . divalproex  125 mg Oral Q12H  . enoxaparin (LOVENOX) injection  40 mg Subcutaneous Q24H  . insulin aspart  0-5 Units Subcutaneous QHS  . insulin aspart  0-9 Units Subcutaneous TID WC  . losartan  50 mg Oral Daily  . metoprolol succinate  100 mg Oral Daily  . QUEtiapine  25 mg Oral QHS  . sertraline  50 mg Oral Daily   Continuous Infusions:   LOS: 3 days    Time spent: 35 minutes     Brandi L. Alfredo Martinez, [facility] 10/31/2018, 7:37 AM     Please page through AMION:  [email or web address] Password [email or web address] If 7PM-7AM, please contact night-coverage      Attending MD Note:  I have seen  and examined the patient with nurse practitioner/physician assistant and agree with the note above which has been edited to reflect our agreed upon history, exam, and assessment/plan.   I have personally reviewed the orders for the patient, which were made under my direction.       [place]

## 2018-11-01 DIAGNOSIS — F411 Generalized anxiety disorder: Secondary | ICD-10-CM

## 2018-11-01 DIAGNOSIS — F330 Major depressive disorder, recurrent, mild: Secondary | ICD-10-CM

## 2018-11-01 LAB — GLUCOSE, CAPILLARY
Glucose-Capillary: 117 mg/dL — ABNORMAL HIGH (ref 70–99)
Glucose-Capillary: 111 mg/dL — ABNORMAL HIGH (ref 70–99)

## 2018-11-01 LAB — NOVEL CORONAVIRUS, NAA (HOSP ORDER, SEND-OUT TO REF LAB; TAT 18-24 HRS): SARS-CoV-2, NAA: NOT DETECTED

## 2018-11-01 MED ORDER — ACETAMINOPHEN ER 650 MG PO TBCR: 650.0000 mg | EXTENDED_RELEASE_TABLET | Freq: Three times a day (TID) | ORAL | Status: AC | PRN

## 2018-11-01 MED ORDER — CLONAZEPAM 0.25 MG PO TBDP: 0.2500 mg | ORAL_TABLET | Freq: Two times a day (BID) | ORAL | 0 refills | Status: DC

## 2018-11-01 MED ORDER — HYDROCODONE-ACETAMINOPHEN 5-325 MG PO TABS: 1.0000 | ORAL_TABLET | Freq: Three times a day (TID) | ORAL | 0 refills | Status: AC | PRN

## 2018-11-01 MED ORDER — QUETIAPINE FUMARATE 25 MG PO TABS: 25.0000 mg | ORAL_TABLET | Freq: Every day | ORAL | 2 refills | Status: DC

## 2018-11-01 NOTE — Progress Notes (Signed)
Physical Therapy Treatment Patient Details Name: [name] MRN: [id number] DOB: Jun 22, 1932 Today's Date: 11/01/2018    History of Present Illness 85 yo female who was admitted from home per chart after pt was found wandering around outside thinking her home was being attacked by some men.  Pt is per chart in a single family home, has been living alone per her report.  Has acute metabolic encephalopathy, confusion and has many bites over her skin.  PMHx:  DM, HTN, atherosclerosis, anxiety, L TKA     PT Comments    Pt tolerated treatment well. Pt demonstrates improved tolerance for OOB activity, however her balance remains altered and she is at a high falls risk according to BERG balance test score of 35. Pt requires minA for assistance during ambulation 2/2 multiple losses of balance. Pt with wandering cognition throughout session, reporting that family are able to assist in care but unable to name these family members. PT recommends SNF, unless 24/7 assistance can be provided at home.   Follow Up Recommendations  SNF;Home health PT;Supervision/Assistance - 24 hour(SNF vs Home Health PT and 24/7 assistance from family)     Equipment Recommendations  None recommended by PT    Recommendations for Other Services       Precautions / Restrictions Precautions Precautions: Fall Restrictions Weight Bearing Restrictions: No    Mobility  Bed Mobility Overal bed mobility: Modified Independent Bed Mobility: Supine to Sit     Supine to sit: Modified independent (Device/Increase time)        Transfers Overall transfer level: Needs assistance Equipment used: None Transfers: Sit to/from Stand Sit to Stand: Supervision            Ambulation/Gait Ambulation/Gait assistance: Min guard;Min assist Gait Distance (Feet): 400 Feet Assistive device: None Gait Pattern/deviations: Step-through pattern;Staggering left;Staggering right Gait velocity: reduced Gait velocity  interpretation: <1.8 ft/sec, indicate of risk for recurrent falls General Gait Details: pt minG for majority of ambulation, losing balance 3 times requiring minA to correct   Stairs Stairs: Yes Stairs assistance: Min guard Stair Management: Two rails;Forwards Number of Stairs: 4     Wheelchair Mobility    Modified Rankin (Stroke Patients Only)       Balance Overall balance assessment: History of Falls;Needs assistance Sitting-balance support: No upper extremity supported;Feet supported Sitting balance-Leahy Scale: Fair Sitting balance - Comments: supervision for sitting balance   Standing balance support: No upper extremity supported Standing balance-Leahy Scale: Poor Standing balance comment: close supervision for static standing balance, minG-minA for dynamic standing balance                 Standardized Balance Assessment Standardized Balance Assessment : Berg Balance Test Berg Balance Test Sit to Stand: Able to stand  independently using hands Standing Unsupported: Able to stand 2 minutes with supervision Sitting with Back Unsupported but Feet Supported on Floor or Stool: Able to sit 2 minutes under supervision Stand to Sit: Sits safely with minimal use of hands Transfers: Able to transfer safely, minor use of hands Standing Unsupported with Eyes Closed: Able to stand 10 seconds with supervision Standing Ubsupported with Feet Together: Able to place feet together independently but unable to hold for 30 seconds From Standing, Reach Forward with Outstretched Arm: Reaches forward but needs supervision From Standing Position, Pick up Object from Floor: Able to pick up shoe, needs supervision From Standing Position, Turn to Look Behind Over each Shoulder: Turn sideways only but maintains balance Turn 360 Degrees: Able to turn  360 degrees safely but slowly Standing Unsupported, Alternately Place Feet on Step/Stool: Able to complete >2 steps/needs minimal  assist Standing Unsupported, One Foot in Front: Able to take small step independently and hold 30 seconds Standing on One Leg: Able to lift leg independently and hold equal to or more than 3 seconds Total Score: 35        Cognition Arousal/Alertness: Awake/alert Behavior During Therapy: WFL for tasks assessed/performed Overall Cognitive Status: No family/caregiver present to determine baseline cognitive functioning                                 General Comments: pt with wandering train of thought, difficulty following multiple step commands at times      Exercises      General Comments        Pertinent Vitals/Pain Pain Assessment: No/denies pain    Home Living                      Prior Function            PT Goals (current goals can now be found in the care plan section) Acute Rehab PT Goals Patient Stated Goal: to get home Progress towards PT goals: Progressing toward goals    Frequency    Min 2X/week      PT Plan Current plan remains appropriate    Co-evaluation              AM-PAC PT "6 Clicks" Mobility   Outcome Measure  Help needed turning from your back to your side while in a flat bed without using bedrails?: None Help needed moving from lying on your back to sitting on the side of a flat bed without using bedrails?: None Help needed moving to and from a bed to a chair (including a wheelchair)?: A Little Help needed standing up from a chair using your arms (e.g., wheelchair or bedside chair)?: A Little Help needed to walk in hospital room?: A Little Help needed climbing 3-5 steps with a railing? : A Little 6 Click Score: 20    End of Session Equipment Utilized During Treatment: Gait belt Activity Tolerance: Patient tolerated treatment well Patient left: in bed;with call bell/phone within reach;with bed alarm set Nurse Communication: Mobility status PT Visit Diagnosis: Muscle weakness (generalized) (M62.81);Other  abnormalities of gait and mobility (R26.89);Pain;Difficulty in walking, not elsewhere classified (R26.2);Unsteadiness on feet (R26.81)     Time: 1660-6301 PT Time Calculation (min) (ACUTE ONLY): 24 min  Charges:  $Gait Training: 8-22 mins $Neuromuscular Re-education: 8-22 mins                     Arlyss Gandy, PT, DPT Acute Rehabilitation Pager: [phone]    Arlyss Gandy 11/01/2018, 11:29 AM

## 2018-11-01 NOTE — Discharge Summary (Addendum)
Physician Discharge Summary  [name] ZOX:[id number] DOB: 08-Jul-1932 DOA: 10/26/2018  PCP: Aida Puffer, MD  Admit date: 10/26/2018 Discharge date: 11/01/2018  Admitted From: 10/26/18 Disposition:  11/01/18     Recommendations for Outpatient Follow-up:  1. Follow up with PCP in 1-2 weeks 2. Please obtain BMP/CBC in one week 3. Continue daily B12 injections until 10/11 4.   Recommend TAPER Klonopin to off over two weeks 5.   Consider future taper of narcotics to off  5.   Continue Seroquel, consider trial off medications in two weeks      Home Health: SNF, to be determined   Equipment/Devices: Rolling walker, fall precautions   Discharge Condition: Stable  CODE STATUS: Full Code  Diet recommendation: Carbohydrate Modified Diet, Thin Liquids    Brief/Interim Summary: [name] is an 85 y.o. F with HTN, DM, anxiety (on benzodiazepines) and chronic pain (on opiates) who presented to [facility] on 10/2 after being found wandering in the woods with her dog and a knife.     At baseline, she lives at home with her daughter.  Her daughter works during the day.  The patient reportedly sleeps during the day and is up all night.  She has had "memory issues" for some time now". In the past year she was started on opiates and benzodiazepines.      PRINCIPAL HOSPITAL DIAGNOSIS: Acute Metabolic Encephalopathy likely multifactorial from benzodiazepine use/overuse and dehydration superimposed on dementia     Discharge Diagnoses:   Acute metabolic encephalopathy superimposed on suspected dementia Here, her CT head and MRI brain were unremarkable, RPR nonreactive, B12 low and repleted.  EEG normal.  Her attention, alertness and cognition improved with therapy and IV fluids suggesting some of her initial confusion was dehydration.   She has improved significantly, but has some residual psychomotor slowing, unsafe gait, residual confusion. -Recommend taper off  benzodiazepines and avoid all future use -AVOID Beers criteria medications -Recommend outpatient Geriatrics evaluation re: dementia   Hypertensive urgency BP well controlled here with home meds   Diabetes Glucoses well controlled with minimal SSI  Vitamin B12 deficiency -Continue B12 injections 1000 mcg daily until 10/11  Anxiety Started on Seroquel and Depakote here in the hospital by Psychiatry.  She was quite sedated on the combination, so Depakote stopped.    Started on Klonopin taper.    Chronic pain -Continue Norco, needs taper Consider alternative therapies for pain control - heat, massage therapy, non-narcotics, PT and lidoderm patches     Discharge Instructions    Diet Carb Modified   Complete by: As directed    Increase activity slowly   Complete by: As directed      Allergies as of 11/01/2018      Reactions   Escitalopram Diarrhea   Meloxicam Other (See Comments)   insomnia      Medication List    STOP taking these medications   amLODipine 5 MG tablet Commonly known as: NORVASC   clonazePAM 0.5 MG tablet Commonly known as: KLONOPIN Replaced by: clonazePAM 0.25 MG disintegrating tablet     TAKE these medications   acetaminophen 650 MG CR tablet Commonly known as: TYLENOL Take 1 tablet (650 mg total) by mouth every 8 (eight) hours as needed for pain. What changed:   when to take this  reasons to take this   albuterol (2.5 MG/3ML) 0.083% nebulizer solution Commonly known as: PROVENTIL Take 2.5 mg by nebulization every 6 (six) hours as needed for wheezing or shortness  of breath.   clonazePAM 0.25 MG disintegrating tablet Commonly known as: KLONOPIN Take 1 tablet (0.25 mg total) by mouth 2 (two) times daily. Replaces: clonazePAM 0.5 MG tablet   cycloSPORINE 0.05 % ophthalmic emulsion Commonly known as: RESTASIS Place 1 drop into both eyes 2 (two) times daily.   fluticasone 50 MCG/ACT nasal spray Commonly known as: FLONASE Place 1  spray into both nostrils 2 (two) times daily.   HYDROcodone-acetaminophen 5-325 MG tablet Commonly known as: NORCO/VICODIN Take 1 tablet by mouth 3 (three) times daily as needed (pain).   loratadine 10 MG tablet Commonly known as: CLARITIN Take 10 mg by mouth daily.   losartan 50 MG tablet Commonly known as: COZAAR Take 50 mg by mouth daily.   meloxicam 7.5 MG tablet Commonly known as: MOBIC Take 7.5 mg by mouth daily. For back pain   metoprolol succinate 100 MG 24 hr tablet Commonly known as: TOPROL-XL Take 100 mg by mouth daily.   ondansetron 4 MG disintegrating tablet Commonly known as: Zofran ODT Take 1 tablet (4 mg total) by mouth every 8 (eight) hours as needed for nausea or vomiting.   OVER THE COUNTER MEDICATION Take 1 tablet by mouth at bedtime. [facility] over the counter sleeping pill   OVER THE COUNTER MEDICATION Take 15 mLs by mouth at bedtime. [facility] liquid cough syrup - night time   QUEtiapine 25 MG tablet Commonly known as: SEROQUEL Take 1 tablet (25 mg total) by mouth at bedtime.   sertraline 50 MG tablet Commonly known as: ZOLOFT Take 50 mg by mouth daily.   tretinoin 0.025 % cream Commonly known as: RETIN-A Apply 1 application topically See admin instructions. Apply pea sized amount to face at bedtime as directed      Contact information for after-discharge care    Destination    [facility] Preferred SNF .   Service: Skilled Nursing Contact information: [address] [place] [phone]             Allergies  Allergen Reactions  . Escitalopram Diarrhea  . Meloxicam Other (See Comments)    insomnia    Consultations:  Neurology   Procedures/Studies: Ct Head Wo Contrast  Result Date: 10/26/2018 CLINICAL DATA:  Altered mental status EXAM: CT HEAD WITHOUT CONTRAST TECHNIQUE: Contiguous axial images were obtained from the base of the skull through the vertex without intravenous contrast.  COMPARISON:  06/29/2017 FINDINGS: Brain: No evidence of acute infarction, hemorrhage, hydrocephalus, extra-axial collection or mass lesion/mass effect. Unchanged prominent bifrontal extra-axial spaces. Periventricular white matter hypodensity. Vascular: No hyperdense vessel or unexpected calcification. Skull: Normal. Negative for fracture or focal lesion. Sinuses/Orbits: No acute finding. Other: None. IMPRESSION: No acute intracranial pathology.  Small-vessel white matter disease. Electronically Signed   By: Lauralyn PrimesAlex  Bibbey M.D.   On: 10/26/2018 20:18   Mr Brain Wo Contrast  Result Date: 10/29/2018 CLINICAL DATA:  Altered mental status EXAM: MRI HEAD WITHOUT CONTRAST TECHNIQUE: Multiplanar, multiecho pulse sequences of the brain and surrounding structures were obtained without intravenous contrast. COMPARISON:  Head CT from 3 days ago.  Brain MRI 03/20/2009 FINDINGS: Brain: No acute infarction, hemorrhage, hydrocephalus, extra-axial collection or mass lesion. Generalized atrophy in keeping with age. Minimal small vessel ischemic type change in the white matter, less than usually seen by the eighth decade. Vascular: Normal flow voids Skull and upper cervical spine: Degenerative facet spurring and C3-4 advanced disc narrowing. C4-5 auto fusion or ACDF, partially covered. Sclerotic foci in the left parietal bone  and right paramedian frontal bone, essentially stable from 2011 and thus benign Sinuses/Orbits: Bilateral cataract resection.  No acute finding IMPRESSION: Aging brain without acute superimposed finding. Electronically Signed   By: Marnee Spring M.D.   On: 10/29/2018 11:06   Dg Chest Port 1 View  Result Date: 10/27/2018 CLINICAL DATA:  Acute encephalopathy EXAM: PORTABLE CHEST 1 VIEW COMPARISON:  12/10/2009 FINDINGS: No focal opacity or pleural effusion. Stable cardiomediastinal silhouette with tortuosity at the aortic arch. Aortic atherosclerosis. No pneumothorax. IMPRESSION: No active disease.  Electronically Signed   By: Jasmine Pang M.D.   On: 10/27/2018 02:31      Subjective: Pt reports she is fit to go home and attempts to do sit-ups to prove she is capable.  No acute events overnight.   VSS / afebrile.   Discharge Exam: Vitals:   11/01/18 0758 11/01/18 1137  BP: 129/77 117/69  Pulse: 70 71  Resp: 20 20  Temp: 97.9 F (36.6 C) 97.7 F (36.5 C)  SpO2: 95% 98%   Vitals:   10/31/18 2320 11/01/18 0310 11/01/18 0758 11/01/18 1137  BP: 111/62 119/69 129/77 117/69  Pulse: 71 79 70 71  Resp: 18 18 20 20   Temp: 98.1 F (36.7 C) 98 F (36.7 C) 97.9 F (36.6 C) 97.7 F (36.5 C)  TempSrc: Oral Oral Oral Oral  SpO2: 99% 98% 95% 98%    General: Pt is alert, awake, not in acute distress Cardiovascular: RRR, nl S1-S2, no murmurs appreciated.   No LE edema.   Respiratory: Normal respiratory rate and rhythm.  CTAB without rales or wheezes. Abdominal: Abdomen soft and non-tender.  No distension.   Neuro/Psych: Strength symmetric in upper and lower extremities.  Awake, alert to self/place.  Speech clear. Limited insight into her situation limitations.     The results of significant diagnostics from this hospitalization (including imaging, microbiology, ancillary and laboratory) are listed below for reference.     Microbiology: Recent Results (from the past 240 hour(s))  SARS CORONAVIRUS 2 (TAT 6-24 HRS) Nasopharyngeal Nasopharyngeal Swab     Status: None   Collection Time: 10/27/18 12:39 AM   Specimen: Nasopharyngeal Swab  Result Value Ref Range Status   SARS Coronavirus 2 NEGATIVE NEGATIVE Final    Comment: (NOTE) SARS-CoV-2 target nucleic acids are NOT DETECTED. The SARS-CoV-2 RNA is generally detectable in upper and lower respiratory specimens during the acute phase of infection. Negative results do not preclude SARS-CoV-2 infection, do not rule out co-infections with other pathogens, and should not be used as the sole basis for treatment or other patient  management decisions. Negative results must be combined with clinical observations, patient history, and epidemiological information. The expected result is Negative. Fact Sheet for Patients: 12/27/18 Fact Sheet for Healthcare Providers: HairSlick.no This test is not yet approved or cleared by the [place]ir.com FDA and  has been authorized for detection and/or diagnosis of SARS-CoV-2 by FDA under an Emergency Use Authorization (EUA). This EUA will remain  in effect (meaning this test can be used) for the duration of the COVID-19 declaration under Section 56 4(b)(1) of the Act, 21 U.S.C. section 360bbb-3(b)(1), unless the authorization is terminated or revoked sooner. Performed at [facility], [address] [place], [place]      Labs: BNP (last 3 results) No results for input(s): BNP in the last 8760 hours. Basic Metabolic Panel: Recent Labs  Lab 10/26/18 1803 10/27/18 0220 10/27/18 0302  NA 136 136 138  K 3.7 3.8  3.6  CL 102 101  --   CO2 24 22  --   GLUCOSE 129* 114*  --   BUN 13 17  --   CREATININE 0.67 0.71  --   CALCIUM 8.5* 9.1  --   MG  --  2.0  --   PHOS  --  3.3  --    Liver Function Tests: Recent Labs  Lab 10/26/18 1803 10/27/18 0220  AST 20 21  ALT 13 15  ALKPHOS 44 49  BILITOT 0.9 0.7  PROT 6.3* 6.6  ALBUMIN 3.7 4.0   No results for input(s): LIPASE, AMYLASE in the last 168 hours. Recent Labs  Lab 10/26/18 2253  AMMONIA 23   CBC: Recent Labs  Lab 10/26/18 1803 10/27/18 0220 10/27/18 0302  WBC 8.9 8.6  --   NEUTROABS 7.7  --   --   HGB 13.3 13.4 14.3  HCT 38.3 38.6 42.0  MCV 89.9 89.4  --   PLT 207 228  --    Cardiac Enzymes: No results for input(s): CKTOTAL, CKMB, CKMBINDEX, TROPONINI in the last 168 hours. BNP: Invalid input(s): POCBNP CBG: Recent Labs  Lab 10/31/18 0601 10/31/18 1140 10/31/18 1555 10/31/18 2126 11/01/18 0617  GLUCAP 103*  140* 110* 184* 117*   D-Dimer No results for input(s): DDIMER in the last 72 hours. Hgb A1c No results for input(s): HGBA1C in the last 72 hours. Lipid Profile No results for input(s): CHOL, HDL, LDLCALC, TRIG, CHOLHDL, LDLDIRECT in the last 72 hours. Thyroid function studies No results for input(s): TSH, T4TOTAL, T3FREE, THYROIDAB in the last 72 hours.  Invalid input(s): FREET3 Anemia work up No results for input(s): VITAMINB12, FOLATE, FERRITIN, TIBC, IRON, RETICCTPCT in the last 72 hours. Urinalysis    Component Value Date/Time   COLORURINE YELLOW 10/26/2018 1803   APPEARANCEUR CLEAR 10/26/2018 1803   LABSPEC 1.008 10/26/2018 1803   PHURINE 7.0 10/26/2018 1803   GLUCOSEU NEGATIVE 10/26/2018 1803   HGBUR NEGATIVE 10/26/2018 1803   BILIRUBINUR NEGATIVE 10/26/2018 1803   KETONESUR NEGATIVE 10/26/2018 1803   PROTEINUR NEGATIVE 10/26/2018 1803   UROBILINOGEN 1.0 12/26/2007 0308   NITRITE NEGATIVE 10/26/2018 1803   LEUKOCYTESUR NEGATIVE 10/26/2018 1803   Sepsis Labs Invalid input(s): PROCALCITONIN,  WBC,  LACTICIDVEN Microbiology Recent Results (from the past 240 hour(s))  SARS CORONAVIRUS 2 (TAT 6-24 HRS) Nasopharyngeal Nasopharyngeal Swab     Status: None   Collection Time: 10/27/18 12:39 AM   Specimen: Nasopharyngeal Swab  Result Value Ref Range Status   SARS Coronavirus 2 NEGATIVE NEGATIVE Final    Comment: (NOTE) SARS-CoV-2 target nucleic acids are NOT DETECTED. The SARS-CoV-2 RNA is generally detectable in upper and lower respiratory specimens during the acute phase of infection. Negative results do not preclude SARS-CoV-2 infection, do not rule out co-infections with other pathogens, and should not be used as the sole basis for treatment or other patient management decisions. Negative results must be combined with clinical observations, patient history, and epidemiological information. The expected result is Negative. Fact Sheet for  Patients: SugarRoll.be Fact Sheet for Healthcare Providers: https://www.woods-mathews.com/ This test is not yet approved or cleared by the [place] FDA and  has been authorized for detection and/or diagnosis of SARS-CoV-2 by FDA under an Emergency Use Authorization (EUA). This EUA will remain  in effect (meaning this test can be used) for the duration of the COVID-19 declaration under Section 56 4(b)(1) of the Act, 21 U.S.C. section 360bbb-3(b)(1), unless the authorization is terminated or revoked sooner. Performed  at [facility], [address] [place], [place] [address]      Time coordinating discharge: 35 minutes  SIGNED:  Canary Brim, AG-ACNP-S [facility]  [facility] 11/01/2018, 11:43 AM       Attending MD Note:  I have seen and examined the patient with nurse practitioner/physician assistant and agree with the note above which has been edited to reflect our agreed upon history, exam, and assessment/plan.   I have personally reviewed the orders for the patient, which were made under my direction.    [name] is a 85 y.o. female with probable dementia, HTN and DM who presented with acute encephalopathy and delusions.    This was clearly related to benzodiazepine use in this patient who is 85 years old and likely has early dementia.    She will require rehab for now, if her benzos wash out and her opiates can be tapered off, she may be able to dischage to home to live functionally with daughter again    Alberteen Sam

## 2018-11-01 NOTE — TOC Progression Note (Signed)
Transition of Care [facility]) - Progression Note    Patient Details  Name: [name] MRN: [id number] Date of Birth: December 25, 1932  Transition of Care [facility]) CM/SW Contact  Pollie Friar, RN Phone Number: 11/01/2018, 3:38 PM  Clinical Narrative:    Patient has a bed at [facility] once Covid test results.  TOC following.   Expected Discharge Plan: [place] Barriers to Discharge: SNF Pending bed offer, Continued Medical Work up  Expected Discharge Plan and Services Expected Discharge Plan: [place] In-house Referral: Clinical Social Work Discharge Planning Services: NA Post Acute Care Choice: [place] Living arrangements for the past 2 months: Single Family Home Expected Discharge Date: 11/01/18               DME Arranged: N/A DME Agency: NA       HH Arranged: NA HH Agency: NA         Social Determinants of Health (SDOH) Interventions    Readmission Risk Interventions No flowsheet data found.

## 2018-11-01 NOTE — Care Management Important Message (Signed)
Important Message  Patient Details  Name: MATALYNN GRAFF MRN: [id number] Date of Birth: [birth year]   Medicare Important Message Given:  Yes     Quayshaun Hubbert 11/01/2018, 7:25 AM

## 2018-11-02 LAB — GLUCOSE, CAPILLARY
Glucose-Capillary: 126 mg/dL — ABNORMAL HIGH (ref 70–99)
Glucose-Capillary: 84 mg/dL (ref 70–99)

## 2018-11-02 MED ORDER — WHITE PETROLATUM EX OINT
TOPICAL_OINTMENT | CUTANEOUS | Status: AC
  Filled 2018-11-02: qty 28.35

## 2018-11-02 NOTE — Progress Notes (Addendum)
Pt d//c to [facility]. Pt is stable and on room air with no new concerns. D/c report given to facility nurse: Rachael. Pt will be transported out of facility by [facility].

## 2018-11-02 NOTE — Progress Notes (Signed)
No change since discharge yesterday.       PROGRESS NOTE    [name]  BJY:[id number] DOB: 1932-10-22 DOA: 10/26/2018 PCP: Aida PufferLittle, James, MD    Brief Narrative:  Mrs. [name] is a 85 y.o. F with HTN, DM and chronic pain on daily opiates who presented after being found in the woods wandering with her dog and a knife.  In the ER, CT head unremarkable, afebrile, CBC and CMP normal.  UA, ammonia, TSH and EtOH normal.  Admitted for acute encephalopathy.      Assessment & Plan:  Acute metabolic encephalopathy superimposed on suspected dementia -To SNF for rehab   Hypertensive urgency Resolved  Diabetes Glucoses normal  Vitamin B12 deficiency  Anxiety -Continue Klonopin taper, new SEroquel  Chronic pain       DVT prophylaxis: Lovenox  Code Status: Full Code  Family Communication:   Medical decision making Patient discharged yesterday.  Discharge delayed due to COVID test still pending.  Now returned.  SHe will go to rehab, then home.     Consultants:   Psychiatry   Procedures:     Antimicrobials:       Subjective: No fever overnight, no confusion, no respiratory distress.   Objective: Vitals:   11/01/18 1939 11/01/18 2311 11/02/18 0326 11/02/18 0755  BP: (!) 162/80 (!) 94/57 (!) 90/53 119/80  Pulse: 72 66 60 (!) 53  Resp: 18 18 18 16   Temp: 98.2 F (36.8 C) 98.6 F (37 C) 98.4 F (36.9 C) 97.7 F (36.5 C)  TempSrc: Oral Oral Oral Oral  SpO2: 94% 95% 96% 96%    Intake/Output Summary (Last 24 hours) at 11/02/2018 [id number] Last data filed at 11/01/2018 1745 Gross per 24 hour  Intake 120 ml  Output -  Net 120 ml   There were no vitals filed for this visit.  Examination: General appearance: elderly female, sleeping, no acute distress Neuro: Rouses easiyl.  Speech fluent, moves all extremities with global weakness and slow coordination but symmetric.    Psych: Affect flat, attention slowed.   Data Reviewed: I have personally  reviewed following labs and imaging studies:  CBC: Recent Labs  Lab 10/26/18 1803 10/27/18 0220 10/27/18 0302  WBC 8.9 8.6  --   NEUTROABS 7.7  --   --   HGB 13.3 13.4 14.3  HCT 38.3 38.6 42.0  MCV 89.9 89.4  --   PLT 207 228  --    Basic Metabolic Panel: Recent Labs  Lab 10/26/18 1803 10/27/18 0220 10/27/18 0302  NA 136 136 138  K 3.7 3.8 3.6  CL 102 101  --   CO2 24 22  --   GLUCOSE 129* 114*  --   BUN 13 17  --   CREATININE 0.67 0.71  --   CALCIUM 8.5* 9.1  --   MG  --  2.0  --   PHOS  --  3.3  --    GFR: CrCl cannot be calculated (Unknown ideal weight.). Liver Function Tests: Recent Labs  Lab 10/26/18 1803 10/27/18 0220  AST 20 21  ALT 13 15  ALKPHOS 44 49  BILITOT 0.9 0.7  PROT 6.3* 6.6  ALBUMIN 3.7 4.0   No results for input(s): LIPASE, AMYLASE in the last 168 hours. Recent Labs  Lab 10/26/18 2253  AMMONIA 23   Coagulation Profile: No results for input(s): INR, PROTIME in the last 168 hours.   Cardiac Enzymes: No results for input(s): CKTOTAL, CKMB, CKMBINDEX, TROPONINI in  the last 168 hours.   BNP (last 3 results) No results for input(s): PROBNP in the last 8760 hours.   HbA1C: No results for input(s): HGBA1C in the last 72 hours.   CBG: Recent Labs  Lab 10/31/18 1555 10/31/18 2126 11/01/18 0617 11/01/18 1545 11/02/18 0637  GLUCAP 110* 184* 117* 111* 84   Lipid Profile: No results for input(s): CHOL, HDL, LDLCALC, TRIG, CHOLHDL, LDLDIRECT in the last 72 hours.   Thyroid Function Tests: No results for input(s): TSH, T4TOTAL, FREET4, T3FREE, THYROIDAB in the last 72 hours.   Anemia Panel: No results for input(s): VITAMINB12, FOLATE, FERRITIN, TIBC, IRON, RETICCTPCT in the last 72 hours.   Urine analysis:    Component Value Date/Time   COLORURINE YELLOW 10/26/2018 1803   APPEARANCEUR CLEAR 10/26/2018 1803   LABSPEC 1.008 10/26/2018 1803   PHURINE 7.0 10/26/2018 1803   GLUCOSEU NEGATIVE 10/26/2018 1803   HGBUR NEGATIVE  10/26/2018 1803   BILIRUBINUR NEGATIVE 10/26/2018 1803   KETONESUR NEGATIVE 10/26/2018 1803   PROTEINUR NEGATIVE 10/26/2018 1803   UROBILINOGEN 1.0 12/26/2007 0308   NITRITE NEGATIVE 10/26/2018 1803   LEUKOCYTESUR NEGATIVE 10/26/2018 1803   Sepsis Labs: @LABRCNTIP (procalcitonin:4,lacticacidven:4)  ) Recent Results (from the past 240 hour(s))  SARS CORONAVIRUS 2 (TAT 6-24 HRS) Nasopharyngeal Nasopharyngeal Swab     Status: None   Collection Time: 10/27/18 12:39 AM   Specimen: Nasopharyngeal Swab  Result Value Ref Range Status   SARS Coronavirus 2 NEGATIVE NEGATIVE Final    Comment: (NOTE) SARS-CoV-2 target nucleic acids are NOT DETECTED. The SARS-CoV-2 RNA is generally detectable in upper and lower respiratory specimens during the acute phase of infection. Negative results do not preclude SARS-CoV-2 infection, do not rule out co-infections with other pathogens, and should not be used as the sole basis for treatment or other patient management decisions. Negative results must be combined with clinical observations, patient history, and epidemiological information. The expected result is Negative. Fact Sheet for Patients: 12/27/18 Fact Sheet for Healthcare Providers: HairSlick.no This test is not yet approved or cleared by the quierodirigir.com FDA and  has been authorized for detection and/or diagnosis of SARS-CoV-2 by FDA under an Emergency Use Authorization (EUA). This EUA will remain  in effect (meaning this test can be used) for the duration of the COVID-19 declaration under Section 56 4(b)(1) of the Act, 21 U.S.C. section 360bbb-3(b)(1), unless the authorization is terminated or revoked sooner. Performed at [facility], [address], [place], [place]   Novel Coronavirus, NAA (hospital order; send-out to ref lab)     Status: None   Collection Time: 10/31/18  9:45 AM   Specimen: Respiratory   Result Value Ref Range Status   SARS-CoV-2, NAA NOT DETECTED NOT DETECTED Final    Comment: (NOTE) This nucleic acid amplification test was developed and its performance characteristics determined by 12/31/18. Nucleic acid amplification tests include PCR and TMA. This test has not been FDA cleared or approved. This test has been authorized by FDA under an Emergency Use Authorization (EUA). This test is only authorized for the duration of time the declaration that circumstances exist justifying the authorization of the emergency use of in vitro diagnostic tests for detection of SARS-CoV-2 virus and/or diagnosis of COVID-19 infection under section 564(b)(1) of the Act, 21 U.S.C. [employer]) (1), unless the authorization is terminated or revoked sooner. When diagnostic testing is negative, the possibility of a false negative result should be considered in the context of a patient's recent exposures  and the presence of clinical signs and symptoms consistent with COVID-19. An individual without symptoms of COVID- 19 and who is not shedding SARS-CoV-2 vi rus would expect to have a negative (not detected) result in this assay. Performed At: [facility] [address] [place], [place] [id number] Rush Farmer MD NG:[id number]    [place]  Final    Comment: Performed at [facility], [place] [address] [place], [place] [address]     Radiology Studies: No results found.  Scheduled Meds: . clonazepam  0.25 mg Oral BID  . cyanocobalamin  1,000 mcg Subcutaneous Daily  . enoxaparin (LOVENOX) injection  40 mg Subcutaneous Q24H  . insulin aspart  0-5 Units Subcutaneous QHS  . insulin aspart  0-9 Units Subcutaneous TID WC  . losartan  50 mg Oral Daily  . metoprolol succinate  100 mg Oral Daily  . QUEtiapine  25 mg Oral QHS  . sertraline  50 mg Oral Daily   Continuous Infusions:   LOS: 5 days    Time spent: 5 minutes     Myrene Buddy, MD  [facility] 11/02/2018, 9:18 AM     Please page through [place]:  [email or web address] Password [email or web address] If 7PM-7AM, please contact night-coverage

## 2018-11-02 NOTE — TOC Transition Note (Signed)
CTransition of Care [facility]) - CM/SW Discharge Note   Patient Details  Name: [name] MRN: [id number] Date of Birth: 1932/12/03  [facility] CM/SW Contact:  Pollie Friar, RN Phone Number: 11/02/2018, 9:04 AM   Clinical Narrative:    Pt's covid results back and is discharging to [facility] today. CM will call and update patients son and the patient in the room. Pt to transport via [facility]. Bedside RN updated and d/c packet at the desk.   Room: 117A Number for report: [phone]    Final next level of care: Skilled Nursing Facility Barriers to Discharge: No Barriers Identified   Patient Goals and CMS Choice Patient states their goals for this hospitalization and ongoing recovery are:: Pt son would like his mother to receive rehab to get her betrer. CMS Medicare.gov Compare Post Acute Care list provided to:: Patient Represenative (must comment) Choice offered to / list presented to : Adult Children  Discharge Placement              Patient chooses bed at: [facility] Patient to be transferred to facility by: [facility] Name of family member notified: Altamese Dilling Patient and family notified of of transfer: 11/02/18  Discharge Plan and Services In-house Referral: Clinical Social Work Discharge Planning Services: NA Post Acute Care Choice: [place]          DME Arranged: N/A DME Agency: NA       HH Arranged: NA HH Agency: NA        Social Determinants of Health (SDOH) Interventions     Readmission Risk Interventions No flowsheet data found.

## 2018-11-05 LAB — GLUCOSE, CAPILLARY: Glucose-Capillary: 121 mg/dL — ABNORMAL HIGH (ref 70–99)

## 2020-01-12 ENCOUNTER — Other Ambulatory Visit: Payer: Self-pay

## 2020-01-12 ENCOUNTER — Emergency Department: Payer: Medicare Other

## 2020-01-12 ENCOUNTER — Encounter: Payer: Self-pay | Admitting: Intensive Care

## 2020-01-12 ENCOUNTER — Inpatient Hospital Stay
Admission: EM | Admit: 2020-01-12 | Discharge: 2020-01-15 | DRG: 271 | Disposition: A | Discharge status: Home Health | Payer: Medicare Other | Attending: Internal Medicine | Admitting: Internal Medicine

## 2020-01-12 DIAGNOSIS — Z20822 Contact with and (suspected) exposure to covid-19: Secondary | ICD-10-CM | POA: Diagnosis present

## 2020-01-12 DIAGNOSIS — F32A Depression, unspecified: Secondary | ICD-10-CM | POA: Diagnosis present

## 2020-01-12 DIAGNOSIS — Z8701 Personal history of pneumonia (recurrent): Secondary | ICD-10-CM

## 2020-01-12 DIAGNOSIS — I824Y2 Acute embolism and thrombosis of unspecified deep veins of left proximal lower extremity: Secondary | ICD-10-CM

## 2020-01-12 DIAGNOSIS — N390 Urinary tract infection, site not specified: Secondary | ICD-10-CM | POA: Diagnosis present

## 2020-01-12 DIAGNOSIS — G8929 Other chronic pain: Secondary | ICD-10-CM | POA: Diagnosis present

## 2020-01-12 DIAGNOSIS — I82432 Acute embolism and thrombosis of left popliteal vein: Secondary | ICD-10-CM | POA: Diagnosis present

## 2020-01-12 DIAGNOSIS — F419 Anxiety disorder, unspecified: Secondary | ICD-10-CM | POA: Diagnosis not present

## 2020-01-12 DIAGNOSIS — I10 Essential (primary) hypertension: Secondary | ICD-10-CM | POA: Diagnosis present

## 2020-01-12 DIAGNOSIS — Z8249 Family history of ischemic heart disease and other diseases of the circulatory system: Secondary | ICD-10-CM

## 2020-01-12 DIAGNOSIS — I871 Compression of vein: Secondary | ICD-10-CM | POA: Diagnosis present

## 2020-01-12 DIAGNOSIS — R5381 Other malaise: Secondary | ICD-10-CM | POA: Diagnosis present

## 2020-01-12 DIAGNOSIS — M549 Dorsalgia, unspecified: Secondary | ICD-10-CM | POA: Diagnosis present

## 2020-01-12 DIAGNOSIS — J449 Chronic obstructive pulmonary disease, unspecified: Secondary | ICD-10-CM | POA: Diagnosis present

## 2020-01-12 DIAGNOSIS — E785 Hyperlipidemia, unspecified: Secondary | ICD-10-CM | POA: Diagnosis present

## 2020-01-12 DIAGNOSIS — I82431 Acute embolism and thrombosis of right popliteal vein: Secondary | ICD-10-CM

## 2020-01-12 DIAGNOSIS — N289 Disorder of kidney and ureter, unspecified: Secondary | ICD-10-CM

## 2020-01-12 DIAGNOSIS — Z833 Family history of diabetes mellitus: Secondary | ICD-10-CM

## 2020-01-12 DIAGNOSIS — Z79899 Other long term (current) drug therapy: Secondary | ICD-10-CM

## 2020-01-12 DIAGNOSIS — Z96652 Presence of left artificial knee joint: Secondary | ICD-10-CM | POA: Diagnosis present

## 2020-01-12 DIAGNOSIS — I82412 Acute embolism and thrombosis of left femoral vein: Secondary | ICD-10-CM | POA: Diagnosis present

## 2020-01-12 DIAGNOSIS — B9620 Unspecified Escherichia coli [E. coli] as the cause of diseases classified elsewhere: Secondary | ICD-10-CM | POA: Diagnosis present

## 2020-01-12 DIAGNOSIS — Z90710 Acquired absence of both cervix and uterus: Secondary | ICD-10-CM

## 2020-01-12 DIAGNOSIS — N179 Acute kidney failure, unspecified: Secondary | ICD-10-CM | POA: Diagnosis present

## 2020-01-12 DIAGNOSIS — F0390 Unspecified dementia without behavioral disturbance: Secondary | ICD-10-CM | POA: Diagnosis not present

## 2020-01-12 DIAGNOSIS — I82422 Acute embolism and thrombosis of left iliac vein: Principal | ICD-10-CM | POA: Diagnosis present

## 2020-01-12 DIAGNOSIS — D649 Anemia, unspecified: Secondary | ICD-10-CM | POA: Diagnosis present

## 2020-01-12 DIAGNOSIS — I82439 Acute embolism and thrombosis of unspecified popliteal vein: Secondary | ICD-10-CM | POA: Diagnosis not present

## 2020-01-12 DIAGNOSIS — I824Z2 Acute embolism and thrombosis of unspecified deep veins of left distal lower extremity: Secondary | ICD-10-CM | POA: Diagnosis present

## 2020-01-12 DIAGNOSIS — Z888 Allergy status to other drugs, medicaments and biological substances status: Secondary | ICD-10-CM

## 2020-01-12 LAB — BASIC METABOLIC PANEL
Anion gap: 7 (ref 5–15)
BUN: 41 mg/dL — ABNORMAL HIGH (ref 8–23)
CO2: 24 mmol/L (ref 22–32)
Calcium: 8.6 mg/dL — ABNORMAL LOW (ref 8.9–10.3)
Chloride: 106 mmol/L (ref 98–111)
Creatinine, Ser: 1.15 mg/dL — ABNORMAL HIGH (ref 0.44–1.00)
GFR, Estimated: 47 mL/min — ABNORMAL LOW (ref >60)
Glucose, Bld: 140 mg/dL — ABNORMAL HIGH (ref 70–99)
Potassium: 4 mmol/L (ref 3.5–5.1)
Sodium: 137 mmol/L (ref 135–145)

## 2020-01-12 LAB — URINALYSIS, COMPLETE (UACMP) WITH MICROSCOPIC
Bilirubin Urine: NEGATIVE
Glucose, UA: NEGATIVE mg/dL
Hgb urine dipstick: NEGATIVE
Ketones, ur: NEGATIVE mg/dL
Nitrite: NEGATIVE
Protein, ur: 100 mg/dL — AB
Specific Gravity, Urine: 1.023 (ref 1.005–1.030)
WBC, UA: >50 WBC/hpf — ABNORMAL HIGH (ref 0–5)
pH: 5 (ref 5.0–8.0)

## 2020-01-12 LAB — CBC
HCT: 29.9 % — ABNORMAL LOW (ref 36.0–46.0)
Hemoglobin: 9.8 g/dL — ABNORMAL LOW (ref 12.0–15.0)
MCH: 30.2 pg (ref 26.0–34.0)
MCHC: 32.8 g/dL (ref 30.0–36.0)
MCV: 92 fL (ref 80.0–100.0)
Platelets: 306 10*3/uL (ref 150–400)
RBC: 3.25 MIL/uL — ABNORMAL LOW (ref 3.87–5.11)
RDW: 14.4 % (ref 11.5–15.5)
WBC: 8.3 10*3/uL (ref 4.0–10.5)
nRBC: 0 % (ref 0.0–0.2)

## 2020-01-12 LAB — RESP PANEL BY RT-PCR (FLU A&B, COVID) ARPGX2
Influenza A by PCR: NEGATIVE
Influenza B by PCR: NEGATIVE
SARS Coronavirus 2 by RT PCR: NEGATIVE

## 2020-01-12 LAB — CREATININE, URINE, RANDOM: Creatinine, Urine: 86 mg/dL

## 2020-01-12 LAB — SODIUM, URINE, RANDOM: Sodium, Ur: 49 mmol/L

## 2020-01-12 MED ORDER — METOPROLOL SUCCINATE ER 100 MG PO TB24
100.0000 mg | ORAL_TABLET | Freq: Every day | ORAL | Status: DC
  Administered 2020-01-13 – 2020-01-15 (×3): 100 mg via ORAL
  Filled 2020-01-12 (×3): qty 1

## 2020-01-12 MED ORDER — SENNOSIDES-DOCUSATE SODIUM 8.6-50 MG PO TABS: 1.0000 | ORAL_TABLET | Freq: Every evening | ORAL | Status: DC | PRN

## 2020-01-12 MED ORDER — HEPARIN (PORCINE) 25000 UT/250ML-% IV SOLN
1400.0000 [IU]/h | INTRAVENOUS | Status: DC
  Administered 2020-01-12: 1100 [IU]/h via INTRAVENOUS
  Administered 2020-01-14: 1200 [IU]/h via INTRAVENOUS
  Filled 2020-01-12 (×2): qty 250

## 2020-01-12 MED ORDER — MEMANTINE HCL 5 MG PO TABS
10.0000 mg | ORAL_TABLET | Freq: Every day | ORAL | Status: DC
  Administered 2020-01-13 – 2020-01-15 (×3): 10 mg via ORAL
  Filled 2020-01-12 (×3): qty 2

## 2020-01-12 MED ORDER — SODIUM CHLORIDE 0.9 % IV SOLN
1.0000 g | INTRAVENOUS | Status: DC
  Administered 2020-01-13 – 2020-01-14 (×2): 1 g via INTRAVENOUS
  Filled 2020-01-12: qty 10
  Filled 2020-01-12 (×2): qty 1

## 2020-01-12 MED ORDER — SODIUM CHLORIDE 0.9 % IV SOLN
1.0000 g | Freq: Once | INTRAVENOUS | Status: AC
  Administered 2020-01-12: 1 g via INTRAVENOUS
  Filled 2020-01-12: qty 10

## 2020-01-12 MED ORDER — HEPARIN BOLUS VIA INFUSION
4000.0000 [IU] | Freq: Once | INTRAVENOUS | Status: AC
  Administered 2020-01-12: 4000 [IU] via INTRAVENOUS
  Filled 2020-01-12: qty 4000

## 2020-01-12 MED ORDER — ONDANSETRON HCL 4 MG/2ML IJ SOLN: 4.0000 mg | Freq: Four times a day (QID) | INTRAMUSCULAR | Status: DC | PRN

## 2020-01-12 MED ORDER — HYDROCODONE-ACETAMINOPHEN 5-325 MG PO TABS
1.0000 | ORAL_TABLET | Freq: Four times a day (QID) | ORAL | Status: DC | PRN
  Administered 2020-01-14: 1 via ORAL
  Filled 2020-01-12: qty 1

## 2020-01-12 MED ORDER — ALBUTEROL SULFATE HFA 108 (90 BASE) MCG/ACT IN AERS: 2.0000 | INHALATION_SPRAY | Freq: Four times a day (QID) | RESPIRATORY_TRACT | Status: DC | PRN

## 2020-01-12 MED ORDER — ACETAMINOPHEN 325 MG PO TABS: 650.0000 mg | ORAL_TABLET | Freq: Four times a day (QID) | ORAL | Status: DC | PRN

## 2020-01-12 MED ORDER — CLONAZEPAM 1 MG PO TABS
1.0000 mg | ORAL_TABLET | Freq: Two times a day (BID) | ORAL | Status: DC
  Administered 2020-01-12 – 2020-01-15 (×6): 1 mg via ORAL
  Filled 2020-01-12 (×2): qty 1
  Filled 2020-01-12: qty 2
  Filled 2020-01-12 (×3): qty 1

## 2020-01-12 MED ORDER — SODIUM CHLORIDE 0.9 % IV SOLN: INTRAVENOUS | Status: AC

## 2020-01-12 MED ORDER — ACETAMINOPHEN 650 MG RE SUPP: 650.0000 mg | Freq: Four times a day (QID) | RECTAL | Status: DC | PRN

## 2020-01-12 MED ORDER — ONDANSETRON HCL 4 MG PO TABS: 4.0000 mg | ORAL_TABLET | Freq: Four times a day (QID) | ORAL | Status: DC | PRN

## 2020-01-12 NOTE — ED Triage Notes (Addendum)
PAtient reports falling X2 days ago while outside in woods and reports LOC. Patient reports hitting head. Also left leg/knee is swollen and many scratches present. HX dementia

## 2020-01-12 NOTE — ED Notes (Signed)
Pt's son [name] (emergency contact) would like to be contacted with any updates

## 2020-01-12 NOTE — Progress Notes (Signed)
ANTICOAGULATION CONSULT NOTE  Pharmacy Consult for heparin infusion Indication: VTE Treatment  Allergies  Allergen Reactions  . Escitalopram Diarrhea  . Meloxicam Other (See Comments)    insomnia    Patient Measurements: Height: 5\' 5"  (165.1 cm) Weight: 63.5 kg (140 lb) IBW/kg (Calculated) : 57 Heparin Dosing Weight: 63.5 kg  Vital Signs: Temp: 98.5 F (36.9 C) (12/19 1634) Temp Source: Oral (12/19 1634) BP: 131/76 (12/19 2130) Pulse Rate: 67 (12/19 2130)  Labs: Recent Labs    01/12/20 1626  HGB 9.8*  HCT 29.9*  PLT 306  CREATININE 1.15*    Estimated Creatinine Clearance: 32.2 mL/min (A) (by C-G formula based on SCr of 1.15 mg/dL (H)).   Medical History: Past Medical History:  Diagnosis Date  . Anxiety   . Anxiety and depression   . Arthritis    "hands, back" (05/04/2017)  . Chronic back pain   . Colitis 05/03/2017  . Depression   . Diverticulosis of colon (without mention of hemorrhage)   . Hiatal hernia   . History of blood transfusion 07/1974   "related to childbirth"  . Hyperlipidemia   . Hypertension   . Non compliance w medication regimen   . Other and unspecified noninfectious gastroenteritis and colitis(558.9)   . Pneumonia    "several times; same time q yr in [place]" (05/04/2017)    Goal of Therapy:  Heparin level 0.3-0.7 units/ml Monitor platelets by anticoagulation protocol: Yes   Plan:  Order baseline INR & aPTT Give 4000 units bolus x 1 Start heparin infusion at 1100 units/hr Check anti-Xa level in 8 hours and daily while on heparin Continue to monitor H&H and platelets  07/04/2017, PharmD, [facility] 01/12/2020 9:55 PM

## 2020-01-12 NOTE — ED Notes (Signed)
Pt provided with dinner tray.

## 2020-01-12 NOTE — ED Notes (Signed)
Pt given cup of OJ along with night dose of klonopin.

## 2020-01-12 NOTE — H&P (Addendum)
History and Physical    [name] S Peace AVW:[id number] DOB: Sep 28, 1932 DOA: 01/12/2020  PCP: Carmon GinsbergPa, [facility]   Patient coming from: Home   Chief Complaint: Left leg swelling and tenderness   HPI: [name] S Limb is a 86 y.o. female with medical history significant for dementia, anxiety, hypertension, and COPD, now presenting to the emergency department for evaluation of swelling and tenderness involving the left leg.  Patient was accompanied by her son who assisted with history.  Patient reportedly fell 2 days ago, stated that she may have hit her head, but denied any loss of consciousness.  Since then, family has noticed increasing left leg swelling and the patient has been complaining of some tenderness in the leg.  She is not appear to be short of breath, denies any significant cough, denies hemoptysis, and denies chest pain.  ED Course: Upon arrival to the ED, patient is found to be afebrile, saturating well on room air, and with stable blood pressure.  EKG features a sinus rhythm.  Chemistry panel is notable for creatinine 1.15, from 0.7 a year ago.  CBC features hemoglobin of 9.8, down from 14 a year ago.  Urinalysis is consistent with possible infection.  ED physician discussed case with vascular surgery who recommended admission and IV heparin.  COVID-19 screening test is pending.  Review of Systems:  All other systems reviewed and apart from HPI, are negative.  Past Medical History:  Diagnosis Date  . Anxiety   . Anxiety and depression   . Arthritis    "hands, back" (05/04/2017)  . Chronic back pain   . Colitis 05/03/2017  . Depression   . Diverticulosis of colon (without mention of hemorrhage)   . Hiatal hernia   . History of blood transfusion 07/1974   "related to childbirth"  . Hyperlipidemia   . Hypertension   . Non compliance w medication regimen   . Other and unspecified noninfectious gastroenteritis and colitis(558.9)   . Pneumonia    "several times; same  time q yr in [place]" (05/04/2017)    Past Surgical History:  Procedure Laterality Date  . BACK SURGERY    . CESAREAN SECTION    . EXCISIONAL HEMORRHOIDECTOMY    . HERNIA REPAIR    . INCONTINENCE SURGERY    . JOINT REPLACEMENT    . LUMBAR SPINE SURGERY  2009  . MOLE REMOVAL  04/2017   "off my back" (05/04/2017)  . SHOULDER ARTHROSCOPY Left 2007  . SPINAL FIXATION SURGERY  Multiple   "on my back and neck"  . TOTAL KNEE ARTHROPLASTY  2007   left  . VAGINAL HYSTERECTOMY    . VENTRAL HERNIA REPAIR  2003    Social History:   reports that she has never smoked. She has never used smokeless tobacco. She reports previous alcohol use. She reports that she does not use drugs.  Allergies  Allergen Reactions  . Escitalopram Diarrhea  . Meloxicam Other (See Comments)    insomnia    Family History  Problem Relation Age of Onset  . Coronary artery disease Father 2973  . Diabetes Father   . Coronary artery disease Mother   . Diabetes Mother   . Diabetes Sister   . Diabetes Sister      Prior to Admission medications   Medication Sig Start Date End Date Taking? Authorizing Provider  acetaminophen (TYLENOL) 650 MG CR tablet Take 1 tablet (650 mg total) by mouth every 8 (eight) hours as needed for pain. 11/01/18  Yes Danford, Earl Lites, MD  clonazePAM (KLONOPIN) 1 MG tablet Take 1 tablet by mouth 2 (two) times daily. 12/27/19  Yes [provider]  HYDROcodone-acetaminophen (NORCO/VICODIN) 5-325 MG tablet Take 1 tablet by mouth 3 (three) times daily as needed (pain). 11/01/18  Yes Danford, Earl Lites, MD  losartan (COZAAR) 50 MG tablet Take 50 mg by mouth daily.   Yes [provider]  memantine (NAMENDA) 10 MG tablet Take 10 mg by mouth daily. 01/10/20  Yes [provider]  metoprolol succinate (TOPROL-XL) 100 MG 24 hr tablet Take 100 mg by mouth daily. 10/15/18  Yes [provider]  OVER THE COUNTER MEDICATION Take 1 tablet by mouth at bedtime. [facility] over  the counter sleeping pill   Yes [provider]  OVER THE COUNTER MEDICATION Take 15 mLs by mouth at bedtime. [facility] liquid cough syrup - night time   Yes [provider]  albuterol (PROVENTIL) (2.5 MG/3ML) 0.083% nebulizer solution Take 2.5 mg by nebulization every 6 (six) hours as needed for wheezing or shortness of breath. Patient not taking: No sig reported    [provider]  fluticasone (FLONASE) 50 MCG/ACT nasal spray Place 1 spray into both nostrils 2 (two) times daily. Patient not taking: No sig reported 08/13/18   [provider]  loratadine (CLARITIN) 10 MG tablet Take 10 mg by mouth daily. Patient not taking: No sig reported 08/08/18   [provider]  meloxicam (MOBIC) 7.5 MG tablet Take 7.5 mg by mouth daily. For back pain Patient not taking: No sig reported 10/03/18   [provider]    Physical Exam: Vitals:   01/12/20 2115 01/12/20 2130 01/12/20 2200 01/12/20 2215  BP:  131/76 (!) 133/93   Pulse: 66 67 67 72  Resp: 12 15 17 18   Temp:      TempSrc:      SpO2: 100% 100% 100% 100%  Weight:      Height:        Constitutional: NAD, calm  Eyes: PERTLA, lids and conjunctivae normal ENMT: Mucous membranes are moist. Posterior pharynx clear of any exudate or lesions.   Neck: normal, supple, no masses, no thyromegaly Respiratory: clear to auscultation bilaterally, no wheezing, no crackles. No accessory muscle use.  Cardiovascular: S1 & S2 heard, regular rate and rhythm. Entire left leg leg swollen. 2+ DP pulse on left.  Abdomen: No distension, no tenderness, soft. Bowel sounds active.  Musculoskeletal: no clubbing / cyanosis. Left leg swollen and tender.   Skin: no significant rashes, lesions, ulcers. Warm, dry, well-perfused. Neurologic: CN 2-12 grossly intact. Sensation intact. Moving all extremities.  Psychiatric: Alert and oriented to person and place only. Pleasant and cooperative.    Labs and Imaging on Admission: I  have personally reviewed following labs and imaging studies  CBC: Recent Labs  Lab 01/12/20 1626  WBC 8.3  HGB 9.8*  HCT 29.9*  MCV 92.0  PLT 306   Basic Metabolic Panel: Recent Labs  Lab 01/12/20 1626  NA 137  K 4.0  CL 106  CO2 24  GLUCOSE 140*  BUN 41*  CREATININE 1.15*  CALCIUM 8.6*   GFR: Estimated Creatinine Clearance: 32.2 mL/min (A) (by C-G formula based on SCr of 1.15 mg/dL (H)). Liver Function Tests: No results for input(s): AST, ALT, ALKPHOS, BILITOT, PROT, ALBUMIN in the last 168 hours. No results for input(s): LIPASE, AMYLASE in the last 168 hours. No results for input(s): AMMONIA in the last 168 hours. Coagulation Profile: No  results for input(s): INR, PROTIME in the last 168 hours. Cardiac Enzymes: No results for input(s): CKTOTAL, CKMB, CKMBINDEX, TROPONINI in the last 168 hours. BNP (last 3 results) No results for input(s): PROBNP in the last 8760 hours. HbA1C: No results for input(s): HGBA1C in the last 72 hours. CBG: No results for input(s): GLUCAP in the last 168 hours. Lipid Profile: No results for input(s): CHOL, HDL, LDLCALC, TRIG, CHOLHDL, LDLDIRECT in the last 72 hours. Thyroid Function Tests: No results for input(s): TSH, T4TOTAL, FREET4, T3FREE, THYROIDAB in the last 72 hours. Anemia Panel: No results for input(s): VITAMINB12, FOLATE, FERRITIN, TIBC, IRON, RETICCTPCT in the last 72 hours. Urine analysis:    Component Value Date/Time   COLORURINE YELLOW (A) 01/12/2020 1635   APPEARANCEUR TURBID (A) 01/12/2020 1635   LABSPEC 1.023 01/12/2020 1635   PHURINE 5.0 01/12/2020 1635   GLUCOSEU NEGATIVE 01/12/2020 1635   HGBUR NEGATIVE 01/12/2020 1635   BILIRUBINUR NEGATIVE 01/12/2020 1635   KETONESUR NEGATIVE 01/12/2020 1635   PROTEINUR 100 (A) 01/12/2020 1635   UROBILINOGEN 1.0 12/26/2007 0308   NITRITE NEGATIVE 01/12/2020 1635   LEUKOCYTESUR MODERATE (A) 01/12/2020 1635   Sepsis  Labs: @LABRCNTIP (procalcitonin:4,lacticidven:4) ) Recent Results (from the past 240 hour(s))  Resp Panel by RT-PCR (Flu A&B, Covid) Nasopharyngeal Swab     Status: None   Collection Time: 01/12/20  9:20 PM   Specimen: Nasopharyngeal Swab; Nasopharyngeal(NP) swabs in vial transport medium  Result Value Ref Range Status   SARS Coronavirus 2 by RT PCR NEGATIVE NEGATIVE Final    Comment: (NOTE) SARS-CoV-2 target nucleic acids are NOT DETECTED.  The SARS-CoV-2 RNA is generally detectable in upper respiratory specimens during the acute phase of infection. The lowest concentration of SARS-CoV-2 viral copies this assay can detect is 138 copies/mL. A negative result does not preclude SARS-Cov-2 infection and should not be used as the sole basis for treatment or other patient management decisions. A negative result may occur with  improper specimen collection/handling, submission of specimen other than nasopharyngeal swab, presence of viral mutation(s) within the areas targeted by this assay, and inadequate number of viral copies(<138 copies/mL). A negative result must be combined with clinical observations, patient history, and epidemiological information. The expected result is Negative.  Fact Sheet for Patients:  01/14/20  Fact Sheet for Healthcare Providers:  BloggerCourse.com  This test is no t yet approved or cleared by the [employer] FDA and  has been authorized for detection and/or diagnosis of SARS-CoV-2 by FDA under an Emergency Use Authorization (EUA). This EUA will remain  in effect (meaning this test can be used) for the duration of the COVID-19 declaration under Section 564(b)(1) of the Act, 21 U.S.C.section 360bbb-3(b)(1), unless the authorization is terminated  or revoked sooner.       Influenza A by PCR NEGATIVE NEGATIVE Final   Influenza B by PCR NEGATIVE NEGATIVE Final    Comment: (NOTE) The Xpert Xpress  SARS-CoV-2/FLU/RSV plus assay is intended as an aid in the diagnosis of influenza from Nasopharyngeal swab specimens and should not be used as a sole basis for treatment. Nasal washings and aspirates are unacceptable for Xpert Xpress SARS-CoV-2/FLU/RSV testing.  Fact Sheet for Patients: [place]  Fact Sheet for Healthcare Providers: BloggerCourse.com  This test is not yet approved or cleared by the [employer]ious[employer].[place] FDA and has been authorized for detection and/or diagnosis of SARS-CoV-2 by FDA under an Emergency Use Authorization (EUA). This EUA will remain in effect (meaning this test can be used) for the duration  of the COVID-19 declaration under Section 564(b)(1) of the Act, 21 U.S.C. section 360bbb-3(b)(1), unless the authorization is terminated or revoked.  Performed at [facility], [address] [place], [place] [address]      Radiological Exams on Admission: CT HEAD WO CONTRAST  Result Date: 01/12/2020 CLINICAL DATA:  Larey Seat 2 days ago, loss of consciousness, hit head, dimension EXAM: CT HEAD WITHOUT CONTRAST TECHNIQUE: Contiguous axial images were obtained from the base of the skull through the vertex without intravenous contrast. COMPARISON:  10/26/2018 FINDINGS: Brain: No acute infarct or hemorrhage. Hypodensities in the bilateral basal ganglia consistent with chronic lacunar infarcts, stable. Lateral ventricles and remaining midline structures are unremarkable. No acute extra-axial fluid collections. No mass effect. Vascular: Stable atherosclerosis of the internal carotid arteries. No hyperdense vessel. Skull: Normal. Negative for fracture or focal lesion. Sinuses/Orbits: No acute finding. Other: None. IMPRESSION: 1. Stable exam, no acute intracranial process. Electronically Signed   By: Sharlet Salina M.D.   On: 01/12/2020 17:14   US Venous Img Lower Unilateral Left  Result Date: 01/12/2020 CLINICAL DATA:   Initial evaluation for acute swelling. EXAM: LEFT LOWER EXTREMITY VENOUS DOPPLER ULTRASOUND TECHNIQUE: Gray-scale sonography with graded compression, as well as color Doppler and duplex ultrasound were performed to evaluate the lower extremity deep venous systems from the level of the common femoral vein and including the common femoral, femoral, profunda femoral, popliteal and calf veins including the posterior tibial, peroneal and gastrocnemius veins when visible. The superficial great saphenous vein was also interrogated. Spectral Doppler was utilized to evaluate flow at rest and with distal augmentation maneuvers in the common femoral, femoral and popliteal veins. COMPARISON:  Prior ultrasound from 10/18/2005. FINDINGS: Contralateral Common Femoral Vein: Respiratory phasicity is normal and symmetric with the symptomatic side. No evidence of thrombus. Normal compressibility. Common Femoral Vein: Occlusive echogenic thrombus seen throughout the left common femoral vein. Saphenofemoral Junction: Occlusive thrombus throughout the left saphenofemoral junction. Profunda Femoral Vein: Occlusive echogenic thrombus throughout the deep left femoral vein. Femoral Vein: Occlusive echogenic thrombus throughout the left femoral vein. Popliteal Vein: Occlusive echogenic thrombus throughout the left popliteal vein. Calf Veins: Occlusive echogenic thrombus throughout the visualized veins of the calf. Superficial Great Saphenous Vein: Visualized left great saphenous vein appears patent. Other Findings: Echogenic occlusive thrombus extends into the visualized left external iliac vein. The left common iliac vein is not visualized on this exam. Visualized proximal-mid IVC appears to be grossly patent. IMPRESSION: Extensive acute occlusive DVT extending from the left external iliac vein through the veins of the left calf. Electronically Signed   By: Rise Mu M.D.   On: 01/12/2020 20:38   EKG (personally reviewed): NSR.    Assessment/Plan   1. Acute proximal LLE DVT  - Presents with 2-3 days of LLE swelling and tenderness and is found on [place] to have acute occlusive DVT from ext iliac v through calf veins  - No evidence for PE but she is at high risk for this  - Leg is tender but warm, compartments soft, and there is strong DP pulse  - She has been started on IV heparin in ED which will be continued for now with close monitoring   2. Mild renal insufficiency  - SCr is 1.15 on admission, up from 0.7 a year ago  - Hold losartan initially, hydrate with IVF overnight, and repeat chem panel in am    3. Anxiety, dementia  - Appears stable, continue Namenda and Klonopin    4. Hypertension  -  BP at goal  - Hold losartan initially as above, continue Toprol as tolerated    5. Anemia  - Hgb is 9.8 on admission, down from 13.4 one year ago  - No evidence for active bleeding, will need to follow closely while initiating anticoagulation   6. Acute lower UTI  - There is suprapubic tenderness and UA is consistent with infection  - No flank pain or evidence for systemic infection  - Urine culture and Rocephin ordered from ED, will continue Rocephin while following culture    DVT prophylaxis: IV heparin  Code Status: Full  Family Communication: Discussed with patient  Disposition Plan:  Patient is from: Home   Anticipated d/c is to: TBD Anticipated d/c date is: 01/15/20 Patient currently: Pending improvement in leg swelling pain Consults called: None  Admission status: Inpatient     Briscoe Deutscher, MD [facility]  01/12/2020, 10:38 PM

## 2020-01-12 NOTE — ED Provider Notes (Addendum)
[facility] Emergency Department Provider Note   ____________________________________________   I have reviewed the triage vital signs and the nursing notes.   HISTORY  Chief Complaint Leg Pain and Head Injury   History limited by and level 5 caveat due to: Dementia  HPI [name] is a 86 y.o. female who presents to the emergency department today because of concerns for leg swelling.  Patient does have dementia and cannot give a good history at this time.  Patient did have a fall 2 days ago.  Apparently at that time family first noticed some swelling to the left side of her leg.  They have watch this swelling for the past 2 days and while it is somewhat better today persisted so they came to the emergency department.  Patient does complain of some discomfort to that leg.  She additionally was reported to potentially hit her head.   Records reviewed. Per medical record review patient has a history of HLD, HTN.   Past Medical History:  Diagnosis Date  . Anxiety   . Anxiety and depression   . Arthritis    "hands, back" (05/04/2017)  . Chronic back pain   . Colitis 05/03/2017  . Depression   . Diverticulosis of colon (without mention of hemorrhage)   . Hiatal hernia   . History of blood transfusion 07/1974   "related to childbirth"  . Hyperlipidemia   . Hypertension   . Non compliance w medication regimen   . Other and unspecified noninfectious gastroenteritis and colitis(558.9)   . Pneumonia    "several times; same time q yr in [place]" (05/04/2017)    Patient Active Problem List   Diagnosis Date Noted  . Major depressive disorder, recurrent episode, mild (HCC) 10/29/2018  . Encephalopathy acute 10/28/2018  . Acute encephalopathy 10/27/2018  . Lower GI bleed   . Hypokalemia 05/04/2017  . Syncope, vasovagal 05/04/2017  . Nausea, vomiting, and diarrhea 05/04/2017  . Melena 05/04/2017  . Colitis 05/03/2017  . Hypothyroidism 10/10/2015  . Adjustment  disorder with mixed anxiety and depressed mood 10/10/2015  . Sleeping difficulty 10/10/2015  . Mixed hyperlipidemia 10/10/2015  . Chronic Chest pain- unknwn etiology- noncardiogenic 10/10/2015  . Generalized OA 10/10/2015  . Overweight (BMI 25.0-29.9) 10/10/2015  . History of noncompliance with medical treatment 10/10/2015  . Hypertension 10/08/2015  . Diverticulosis of colon (without mention of hemorrhage) 10/08/2015  . GAD (generalized anxiety disorder) 10/08/2015    Past Surgical History:  Procedure Laterality Date  . BACK SURGERY    . CESAREAN SECTION    . EXCISIONAL HEMORRHOIDECTOMY    . HERNIA REPAIR    . INCONTINENCE SURGERY    . JOINT REPLACEMENT    . LUMBAR SPINE SURGERY  2009  . MOLE REMOVAL  04/2017   "off my back" (05/04/2017)  . SHOULDER ARTHROSCOPY Left 2007  . SPINAL FIXATION SURGERY  Multiple   "on my back and neck"  . TOTAL KNEE ARTHROPLASTY  2007   left  . VAGINAL HYSTERECTOMY    . VENTRAL HERNIA REPAIR  2003    Prior to Admission medications   Medication Sig Start Date End Date Taking? Authorizing Provider  acetaminophen (TYLENOL) 650 MG CR tablet Take 1 tablet (650 mg total) by mouth every 8 (eight) hours as needed for pain. 11/01/18   Danford, Earl Lites, MD  albuterol (PROVENTIL) (2.5 MG/3ML) 0.083% nebulizer solution Take 2.5 mg by nebulization every 6 (six) hours as needed for wheezing or shortness of breath.  [provider]  clonazePAM (KLONOPIN) 0.25 MG disintegrating tablet Take 1 tablet (0.25 mg total) by mouth 2 (two) times daily. 11/01/18   Danford, Earl Lites, MD  cycloSPORINE (RESTASIS) 0.05 % ophthalmic emulsion Place 1 drop into both eyes 2 (two) times daily.    [provider]  fluticasone (FLONASE) 50 MCG/ACT nasal spray Place 1 spray into both nostrils 2 (two) times daily. 08/13/18   [provider]  HYDROcodone-acetaminophen (NORCO/VICODIN) 5-325 MG tablet Take 1 tablet by mouth 3 (three) times daily as  needed (pain). 11/01/18   Danford, Earl Lites, MD  loratadine (CLARITIN) 10 MG tablet Take 10 mg by mouth daily. 08/08/18   [provider]  losartan (COZAAR) 50 MG tablet Take 50 mg by mouth daily.    [provider]  meloxicam (MOBIC) 7.5 MG tablet Take 7.5 mg by mouth daily. For back pain 10/03/18   [provider]  metoprolol succinate (TOPROL-XL) 100 MG 24 hr tablet Take 100 mg by mouth daily. 10/15/18   [provider]  ondansetron (ZOFRAN ODT) 4 MG disintegrating tablet Take 1 tablet (4 mg total) by mouth every 8 (eight) hours as needed for nausea or vomiting. 05/07/17   Rai, Ripudeep K, MD  OVER THE COUNTER MEDICATION Take 1 tablet by mouth at bedtime. [facility] over the counter sleeping pill    [provider]  OVER THE COUNTER MEDICATION Take 15 mLs by mouth at bedtime. [facility] liquid cough syrup - night time    [provider]  QUEtiapine (SEROQUEL) 25 MG tablet Take 1 tablet (25 mg total) by mouth at bedtime. 11/01/18   Danford, Earl Lites, MD  sertraline (ZOLOFT) 50 MG tablet Take 50 mg by mouth daily.    [provider]  tretinoin (RETIN-A) 0.025 % cream Apply 1 application topically See admin instructions. Apply pea sized amount to face at bedtime as directed 10/10/18   [provider]    Allergies Escitalopram and Meloxicam  Family History  Problem Relation Age of Onset  . Coronary artery disease Father 16  . Diabetes Father   . Coronary artery disease Mother   . Diabetes Mother   . Diabetes Sister   . Diabetes Sister     Social History Social History   Tobacco Use  . Smoking status: Never Smoker  . Smokeless tobacco: Never Used  Vaping Use  . Vaping Use: Never used  Substance Use Topics  . Alcohol use: Not Currently  . Drug use: Never    Review of Systems Constitutional: No fever/chills Eyes: No visual changes. ENT: No sore throat. Cardiovascular: Denies chest pain. Respiratory: Denies shortness  of breath. Gastrointestinal: No abdominal pain.  No nausea, no vomiting.  No diarrhea.   Genitourinary: Negative for dysuria. Musculoskeletal: Positive for left leg swelling.  Skin: Negative for rash. Neurological: Negative for headaches, focal weakness or numbness.  ____________________________________________   PHYSICAL EXAM:  VITAL SIGNS: ED Triage Vitals  Enc Vitals Group     BP 01/12/20 1634 (!) 144/77     Pulse Rate 01/12/20 1634 71     Resp 01/12/20 1634 18     Temp 01/12/20 1634 98.5 F (36.9 C)     Temp Source 01/12/20 1634 Oral     SpO2 01/12/20 1634 99 %     Weight 01/12/20 1621 140 lb (63.5 kg)     Height 01/12/20 1621 5\' 5"  (1.651 m)     Head Circumference --      Peak Flow --  Pain Score 01/12/20 1620 10   Constitutional: Awake and alert. Not completely oriented. Eyes: Conjunctivae are normal.  ENT      Head: Normocephalic and atraumatic.      Nose: No congestion/rhinnorhea.      Mouth/Throat: Mucous membranes are moist.      Neck: No stridor. Hematological/Lymphatic/Immunilogical: No cervical lymphadenopathy. Cardiovascular: Normal rate, regular rhythm.  No murmurs, rubs, or gallops.  Respiratory: Normal respiratory effort without tachypnea nor retractions. Breath sounds are clear and equal bilaterally. No wheezes/rales/rhonchi. Gastrointestinal: Soft and non tender. No rebound. No guarding.  Rectal: GUIAC negative Musculoskeletal: Swelling throughout the left lower extremity.  Neurologic:  Dementia. Moving all extremities. Skin:  Skin is warm, dry and intact. No rash noted. Psychiatric: Mood and affect are normal. Speech and behavior are normal. Patient exhibits appropriate insight and judgment.  ____________________________________________    LABS (pertinent positives/negatives)  BMP na 137, k 4.0, glu 140, cr 1.15 CBC wbc 8.3, hgb 9.8, plt 306 UA turbid, >50 wbc, many bacteria, moderate  leukocytes  ____________________________________________   EKG  I, Phineas Semen, attending physician, personally viewed and interpreted this EKG  EKG Time: 1627 Rate: 71 Rhythm: normal sinus rhythm Axis: normal Intervals: qtc 428 QRS: narrow ST changes: no st elevation Impression: normal ekg  ____________________________________________    RADIOLOGY  CT head No acute abnormality  ____________________________________________   PROCEDURES  Procedures  ____________________________________________   INITIAL IMPRESSION / ASSESSMENT AND PLAN / ED COURSE  Pertinent labs & imaging results that were available during my care of the patient were reviewed by me and considered in my medical decision making (see chart for details).   Patient presented to the emergency department today accompanied by son because of concerns for left leg swelling.  On exam patient has extensive swelling to the left leg.  Ultrasound is positive for an extensive DVT.  Urine also is concerning for urinary tract infection.  Discussed with Dr. Myra Gianotti with vascular surgery. Will start IV heparin. Will plan on admission to the hospitalist service. Patient was also started on abx.   ___________________________________________   FINAL CLINICAL IMPRESSION(S) / ED DIAGNOSES  Final diagnoses:  Acute deep vein thrombosis (DVT) of proximal vein of left lower extremity (HCC)  Lower urinary tract infection     Note: This dictation was prepared with Dragon dictation. Any transcriptional errors that result from this process are unintentional     Phineas Semen, MD 01/12/20 2121    Phineas Semen, MD 01/12/20 [phone]

## 2020-01-13 ENCOUNTER — Encounter: Payer: Self-pay | Admitting: Family Medicine

## 2020-01-13 ENCOUNTER — Encounter
Admission: EM | Disposition: A | Discharge status: Home Health | Payer: Self-pay | Source: Home / Self Care | Attending: Internal Medicine

## 2020-01-13 DIAGNOSIS — I82422 Acute embolism and thrombosis of left iliac vein: Principal | ICD-10-CM

## 2020-01-13 DIAGNOSIS — I82432 Acute embolism and thrombosis of left popliteal vein: Secondary | ICD-10-CM

## 2020-01-13 DIAGNOSIS — I82402 Acute embolism and thrombosis of unspecified deep veins of left lower extremity: Secondary | ICD-10-CM

## 2020-01-13 DIAGNOSIS — I824Y2 Acute embolism and thrombosis of unspecified deep veins of left proximal lower extremity: Secondary | ICD-10-CM

## 2020-01-13 HISTORY — PX: PERIPHERAL VASCULAR THROMBECTOMY: CATH118306

## 2020-01-13 LAB — BASIC METABOLIC PANEL
Anion gap: 7 (ref 5–15)
BUN: 36 mg/dL — ABNORMAL HIGH (ref 8–23)
CO2: 23 mmol/L (ref 22–32)
Calcium: 8.1 mg/dL — ABNORMAL LOW (ref 8.9–10.3)
Chloride: 108 mmol/L (ref 98–111)
Creatinine, Ser: 0.82 mg/dL (ref 0.44–1.00)
GFR, Estimated: >60 mL/min (ref >60)
Glucose, Bld: 105 mg/dL — ABNORMAL HIGH (ref 70–99)
Potassium: 4.1 mmol/L (ref 3.5–5.1)
Sodium: 138 mmol/L (ref 135–145)

## 2020-01-13 LAB — CBC
HCT: 28.7 % — ABNORMAL LOW (ref 36.0–46.0)
Hemoglobin: 9.6 g/dL — ABNORMAL LOW (ref 12.0–15.0)
MCH: 30.6 pg (ref 26.0–34.0)
MCHC: 33.4 g/dL (ref 30.0–36.0)
MCV: 91.4 fL (ref 80.0–100.0)
Platelets: 256 10*3/uL (ref 150–400)
RBC: 3.14 MIL/uL — ABNORMAL LOW (ref 3.87–5.11)
RDW: 14.5 % (ref 11.5–15.5)
WBC: 8.8 10*3/uL (ref 4.0–10.5)
nRBC: 0 % (ref 0.0–0.2)

## 2020-01-13 LAB — HEPARIN LEVEL (UNFRACTIONATED): Heparin Unfractionated: 0.29 IU/mL — ABNORMAL LOW (ref 0.30–0.70)

## 2020-01-13 LAB — PROTIME-INR
INR: 1.2 (ref 0.8–1.2)
Prothrombin Time: 14.6 seconds (ref 11.4–15.2)

## 2020-01-13 LAB — APTT: aPTT: 135 seconds — ABNORMAL HIGH (ref 24–36)

## 2020-01-13 SURGERY — PERIPHERAL VASCULAR THROMBECTOMY
Anesthesia: Moderate Sedation | Laterality: Left

## 2020-01-13 MED ORDER — CEFAZOLIN SODIUM-DEXTROSE 2-4 GM/100ML-% IV SOLN
INTRAVENOUS | Status: AC
  Administered 2020-01-13: 17:00:00 2 g via INTRAVENOUS
  Filled 2020-01-13: qty 100

## 2020-01-13 MED ORDER — FENTANYL CITRATE (PF) 100 MCG/2ML IJ SOLN
INTRAMUSCULAR | Status: DC | PRN
  Administered 2020-01-13 (×2): 25 ug via INTRAVENOUS

## 2020-01-13 MED ORDER — HEPARIN SODIUM (PORCINE) 1000 UNIT/ML IJ SOLN
INTRAMUSCULAR | Status: AC
  Filled 2020-01-13: qty 1

## 2020-01-13 MED ORDER — MIDAZOLAM HCL 2 MG/2ML IJ SOLN
INTRAMUSCULAR | Status: AC
  Filled 2020-01-13: qty 2

## 2020-01-13 MED ORDER — ALTEPLASE 2 MG IJ SOLR
INTRAMUSCULAR | Status: AC
  Filled 2020-01-13: qty 6

## 2020-01-13 MED ORDER — FENTANYL CITRATE (PF) 100 MCG/2ML IJ SOLN
INTRAMUSCULAR | Status: AC
  Filled 2020-01-13: qty 2

## 2020-01-13 MED ORDER — IODIXANOL 320 MG/ML IV SOLN
INTRAVENOUS | Status: DC | PRN
  Administered 2020-01-13: 18:00:00 40 mL

## 2020-01-13 MED ORDER — MIDAZOLAM HCL 2 MG/2ML IJ SOLN
INTRAMUSCULAR | Status: DC | PRN
  Administered 2020-01-13: 1 mg via INTRAVENOUS
  Administered 2020-01-13: 0.5 mg via INTRAVENOUS

## 2020-01-13 MED ORDER — SODIUM CHLORIDE 0.9 % IV SOLN: INTRAVENOUS | Status: DC

## 2020-01-13 MED ORDER — CEFAZOLIN SODIUM-DEXTROSE 2-4 GM/100ML-% IV SOLN
2.0000 g | INTRAVENOUS | Status: AC
  Filled 2020-01-13: qty 100

## 2020-01-13 MED ORDER — ALTEPLASE 2 MG IJ SOLR
INTRAMUSCULAR | Status: DC | PRN
  Administered 2020-01-13: 6 mg

## 2020-01-13 MED ORDER — HEPARIN SODIUM (PORCINE) 1000 UNIT/ML IJ SOLN
INTRAMUSCULAR | Status: DC | PRN
  Administered 2020-01-13: 3000 [IU] via INTRAVENOUS

## 2020-01-13 MED ORDER — ALBUTEROL SULFATE (2.5 MG/3ML) 0.083% IN NEBU: 2.5000 mg | INHALATION_SOLUTION | Freq: Four times a day (QID) | RESPIRATORY_TRACT | Status: DC | PRN

## 2020-01-13 SURGICAL SUPPLY — 14 items
BALLN DORADO 10X80X80 (BALLOONS) ×2
BALLN ULTRVRSE 12X80X75 (BALLOONS) ×2
BALLOON DORADO 10X80X80 (BALLOONS) IMPLANT
BALLOON ULTRVRSE 12X80X75 (BALLOONS) IMPLANT
CANISTER PENUMBRA ENGINE (MISCELLANEOUS) ×1 IMPLANT
CANNULA 5F STIFF (CANNULA) ×1 IMPLANT
CATH INDIGO 12XTORQ 100 (CATHETERS) ×1 IMPLANT
CATH INDIGO SEP 12 (CATHETERS) ×1 IMPLANT
CATH KUMPE SOFT-VU 5FR 65 (CATHETERS) ×1 IMPLANT
COVER PROBE U/S 5X48 (MISCELLANEOUS) ×1 IMPLANT
KIT ENCORE 26 ADVANTAGE (KITS) ×1 IMPLANT
PACK ANGIOGRAPHY (CUSTOM PROCEDURE TRAY) ×2 IMPLANT
SHEATH PINNACLE 11FRX10 (SHEATH) ×1 IMPLANT
WIRE GUIDERIGHT .035X150 (WIRE) ×2 IMPLANT

## 2020-01-13 NOTE — Progress Notes (Addendum)
Patient is 86 year old female with history of dementia, anxiety, hypertension, COPD who presents from home with evaluation of left leg swelling and tenderness.  She lives with her son and ambulates with the help of cane.  On presentation she was found to have significant edema of the left lower extremity.  She was hemodynamically stable. venous Doppler revealed significant clot burden in the left lower extremity:reported as extensive acute occlusive DVT extending from the left external iliac vein through the veins of the left calf .  IV heparin started for anticoagulation.  Vascular surgery consulted.  Plan for thrombectomy today. Urinalysis was also suggestive of  UTI.  Urine culture sent, currently on ceftriaxone.  She denies any dysuria.  No leukocytosis or fever. Patient seen and examined the bedside this morning.  She was hemodynamically stable and comfortable during my evaluation.  Denies any new complaints.  She was alert and oriented.  Physical examination revealed significant edema of the left lower extremity. Patient seen by Dr. Antionette Char this morning.  I agree with his assessment and plan.

## 2020-01-13 NOTE — Progress Notes (Signed)
ANTICOAGULATION CONSULT NOTE  Pharmacy Consult for heparin infusion Indication: VTE Treatment  Allergies  Allergen Reactions  . Escitalopram Diarrhea  . Meloxicam Other (See Comments)    insomnia    Patient Measurements: Height: 5\' 5"  (165.1 cm) Weight: 63.5 kg (139 lb 15.9 oz) IBW/kg (Calculated) : 57 Heparin Dosing Weight: 63.5 kg  Vital Signs: Temp: 98.6 F (37 C) (12/20 1541) Temp Source: Oral (12/20 1541) BP: 128/67 (12/20 1815) Pulse Rate: 68 (12/20 1800)  Labs: Recent Labs    01/12/20 1626 01/12/20 2224 01/13/20 0544  HGB 9.8*  --  9.6*  HCT 29.9*  --  28.7*  PLT 306  --  256  APTT  --  135*  --   LABPROT  --  14.6  --   INR  --  1.2  --   HEPARINUNFRC  --   --  0.29*  CREATININE 1.15*  --  0.82    Estimated Creatinine Clearance: 45.1 mL/min (by C-G formula based on SCr of 0.82 mg/dL).   Medical History: Past Medical History:  Diagnosis Date  . Anxiety   . Anxiety and depression   . Arthritis    "hands, back" (05/04/2017)  . Chronic back pain   . Colitis 05/03/2017  . Depression   . Diverticulosis of colon (without mention of hemorrhage)   . Hiatal hernia   . History of blood transfusion 07/1974   "related to childbirth"  . Hyperlipidemia   . Hypertension   . Non compliance w medication regimen   . Other and unspecified noninfectious gastroenteritis and colitis(558.9)   . Pneumonia    "several times; same time q yr in [place]" (05/04/2017)    Assesment:  86 yo female with  extensive left lower extremity DVT with planned vascular procedure today.  Pharmacy has been consulted to monitor heparin drip for VTE treatment.  Patient given 4000 units bolus x 1, and heparin infusion started at 1100 units/hr  12/20 0544 HL 0.29 - subtherapeutic Pt mildly subtherapeutic, will increase drip rate to 1200 units/hr and recheck HL in 8 hours per protocol.  Goal of Therapy:  Heparin level 0.3-0.7 units/ml Monitor platelets by anticoagulation protocol: Yes     Plan:   12/21 @1850 - per RN Heparin to be restarted at 2200 s/p thrombectomy today. Drip was held for procedure.  Will order HL for 8 hrs post restart. CBC in am.  1/22, PharmD, BCPS Clinical Pharmacist 01/13/2020 6:54 PM

## 2020-01-13 NOTE — Op Note (Signed)
[facility]   OPERATIVE NOTE   PRE-OPERATIVE DIAGNOSIS: extensive left leg DVT  POST-OPERATIVE DIAGNOSIS: same   PROCEDURE: 1.   US guidance for vascular access to left popliteal vein 2.   Catheter placement into left external iliac vein from left popliteal approach 3.   IVC gram and left leg venogram 4.   Catheter directed thrombolysis with 6 mg of tpa to the left iliac veins, common femoral vein and SFV 5.   Mechanical thrombectomy to left popliteal vein, SFV, common femoral vein, external and common iliac veins 6.   PTA of left common femoral vein with 10 mm balloon 7.   PTA of the left external and common iliac veins with 10 and 12 mm balloons    SURGEON: Festus Barren, MD  ASSISTANT(S): none  ANESTHESIA: local with moderate conscious sedation for 42 minutes using 1.5 mg of Versed and 50 mcg of Fentanyl  ESTIMATED BLOOD LOSS: 300 cc  FINDING(S): 1.  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 female who presents with extensive left leg pain and swelling and a very extensive left lower extremity DVT.  Patient has marked leg swelling and pain.  Venous intervention is performed to reduce the symtpoms and avoid long term postphlebitic symptoms.    DESCRIPTION: After obtaining full informed written consent, the patient was brought back to the vascular suite and placed supine upon the table. Moderate conscious sedation was administered during a face to face encounter with the patient throughout the procedure with my supervision of the RN administering medicines and monitoring the patient's vital signs, pulse oximetry, telemetry and mental status throughout from the start of the procedure until the patient was taken to the recovery room.  After obtaining adequate anesthesia, the patient was prepped and draped in the standard fashion.    The patient was then placed into the  prone position.  The left popliteal vein was then accessed under direct ultrasound guidance without difficulty with a micropuncture needle and a permanent image was recorded.  I then upsized to an 11Fr sheath over a J wire.   3000 units of heparin were then given.  Imaging showed extensive DVT with minimal flow.  A Kumpe catheter and Magic tourque wire were then advanced into the CFV and images were performed.   The common femoral vein and iliac veins remained extensively thrombosed.  I advanced into the proximal external iliac vein which was quite tortuous where there remained thrombus in the common iliac vein appeared highly stenotic.  I was able to cross the thrombus and stenosis and advance into the IVC which was patent.  I then used the penumbra CAT 12 catheter and instilled 6 mg of tpa throughout the iliac veins, common femoral vein, and superficial femoral veins.  After this dwelled, I used the Penumbra Cat 12 catheter and evacuated about 150 cc of effluent with mechanical thrombectomy throughout the iliac veins, CFV, SFV, and popliteal veins.  This had mild improvement proximally, but the common femoral vein and iliac veins had residual thrombus.  Another pass was made with the penumbra CAT 12 catheter concentrating on the common femoral vein and iliac veins.  I then treated the CFV with 10 mm diameter angioplasty balloon to open a channel.  This resulted in some resolution of the thrombus and improved flow with minimal residual thrombus or stenosis in the common femoral vein.  I then turned my attention to the iliac veins.  The stenosis/occlusion and thrombus was treated with a 10 and then a 12 mm diameter angioplasty balloon as there was high-grade stenosis in both the external and common iliac veins even after the thrombus was almost completely removed.  This resulted in significant improvement although there was still residual stenosis in the 50% range in the common iliac vein, the external iliac vein  that had about a 20 to 30% residual stenosis.  Given her age and the tortuosity in the iliac vessels, I elected to leave this moderate residual stenosis and treat her with anticoagulation as she had only a small amount of residual thrombus.  I then elected to terminate the procedure.  The sheath was removed and a dressing was placed.  She was taken to the recovery room in stable condition having tolerated the procedure well.    COMPLICATIONS: None  CONDITION: Stable  Festus Barren 01/13/2020 5:29 PM

## 2020-01-13 NOTE — H&P (View-Only) (Signed)
[facility] Vascular Consult Note  MRN : [id number]  [name] is a 86 y.o. (Sep 13, 1932) female who presents with chief complaint of  Chief Complaint  Patient presents with  . Leg Pain  . Head Injury  .  History of Present Illness: We are asked to see the patient by Dr. Derrill Kay in the emergency department as well as the admitting hospitalist for extensive left lower extremity DVT.  The patient is an 86 year old pleasantly demented female who can provide very little history.  She does not know how long her legs been hurting.  She has been confused more than her baseline.  She was brought to the hospital from her facility where she was found to have a UTI and an extensive left lower extremity DVT.  Her left leg is quite swollen.  She does report pain in the left leg.  It hurts the entire leg.  Ultrasound has been independently reviewed and she has extensive left lower extremity DVT all the way up to the iliofemoral segments.  Current Facility-Administered Medications  Medication Dose Route Frequency Provider Last Rate Last Admin  . 0.9 %  sodium chloride infusion   Intravenous Continuous Opyd, Lavone Neri, MD 100 mL/hr at 01/12/20 2222 New Bag at 01/12/20 2222  . acetaminophen (TYLENOL) tablet 650 mg  650 mg Oral Q6H PRN Opyd, Lavone Neri, MD       Or  . acetaminophen (TYLENOL) suppository 650 mg  650 mg Rectal Q6H PRN Opyd, Lavone Neri, MD      . albuterol (PROVENTIL) (2.5 MG/3ML) 0.083% nebulizer solution 2.5 mg  2.5 mg Nebulization Q6H PRN Opyd, Lavone Neri, MD      . cefTRIAXone (ROCEPHIN) 1 g in sodium chloride 0.9 % 100 mL IVPB  1 g Intravenous Q24H Opyd, Lavone Neri, MD      . clonazePAM (KLONOPIN) tablet 1 mg  1 mg Oral BID Opyd, Lavone Neri, MD   1 mg at 01/12/20 2331  . heparin ADULT infusion 100 units/mL (25000 units/281mL sodium chloride 0.45%)  1,100 Units/hr Intravenous Continuous Otelia Sergeant, RPH 11 mL/hr at 01/12/20 2217 1,100 Units/hr at 01/12/20 2217  .  HYDROcodone-acetaminophen (NORCO/VICODIN) 5-325 MG per tablet 1 tablet  1 tablet Oral Q6H PRN Opyd, Lavone Neri, MD      . memantine (NAMENDA) tablet 10 mg  10 mg Oral Daily Opyd, Lavone Neri, MD      . metoprolol succinate (TOPROL-XL) 24 hr tablet 100 mg  100 mg Oral Daily Opyd, Lavone Neri, MD      . ondansetron (ZOFRAN) tablet 4 mg  4 mg Oral Q6H PRN Opyd, Lavone Neri, MD       Or  . ondansetron (ZOFRAN) injection 4 mg  4 mg Intravenous Q6H PRN Opyd, Lavone Neri, MD      . senna-docusate (Senokot-S) tablet 1 tablet  1 tablet Oral QHS PRN Opyd, Lavone Neri, MD       Current Outpatient Medications  Medication Sig Dispense Refill  . acetaminophen (TYLENOL) 650 MG CR tablet Take 1 tablet (650 mg total) by mouth every 8 (eight) hours as needed for pain.    . clonazePAM (KLONOPIN) 1 MG tablet Take 1 tablet by mouth 2 (two) times daily.    Marland Kitchen HYDROcodone-acetaminophen (NORCO/VICODIN) 5-325 MG tablet Take 1 tablet by mouth 3 (three) times daily as needed (pain). 10 tablet 0  . losartan (COZAAR) 50 MG tablet Take 50 mg by mouth daily.    . memantine (NAMENDA)  10 MG tablet Take 10 mg by mouth daily.    . metoprolol succinate (TOPROL-XL) 100 MG 24 hr tablet Take 100 mg by mouth daily.    . OVER THE COUNTER MEDICATION Take 1 tablet by mouth at bedtime. [facility] over the counter sleeping pill    . OVER THE COUNTER MEDICATION Take 15 mLs by mouth at bedtime. [facility] liquid cough syrup - night time    . albuterol (PROVENTIL) (2.5 MG/3ML) 0.083% nebulizer solution Take 2.5 mg by nebulization every 6 (six) hours as needed for wheezing or shortness of breath. (Patient not taking: No sig reported)    . fluticasone (FLONASE) 50 MCG/ACT nasal spray Place 1 spray into both nostrils 2 (two) times daily. (Patient not taking: No sig reported)    . loratadine (CLARITIN) 10 MG tablet Take 10 mg by mouth daily. (Patient not taking: No sig reported)    . meloxicam (MOBIC) 7.5 MG tablet Take 7.5 mg by mouth daily. For back pain (Patient not  taking: No sig reported)      Past Medical History:  Diagnosis Date  . Anxiety   . Anxiety and depression   . Arthritis    "hands, back" (05/04/2017)  . Chronic back pain   . Colitis 05/03/2017  . Depression   . Diverticulosis of colon (without mention of hemorrhage)   . Hiatal hernia   . History of blood transfusion 07/1974   "related to childbirth"  . Hyperlipidemia   . Hypertension   . Non compliance w medication regimen   . Other and unspecified noninfectious gastroenteritis and colitis(558.9)   . Pneumonia    "several times; same time q yr in [place]" (05/04/2017)    Past Surgical History:  Procedure Laterality Date  . BACK SURGERY    . CESAREAN SECTION    . EXCISIONAL HEMORRHOIDECTOMY    . HERNIA REPAIR    . INCONTINENCE SURGERY    . JOINT REPLACEMENT    . LUMBAR SPINE SURGERY  2009  . MOLE REMOVAL  04/2017   "off my back" (05/04/2017)  . SHOULDER ARTHROSCOPY Left 2007  . SPINAL FIXATION SURGERY  Multiple   "on my back and neck"  . TOTAL KNEE ARTHROPLASTY  2007   left  . VAGINAL HYSTERECTOMY    . VENTRAL HERNIA REPAIR  2003     Social History   Tobacco Use  . Smoking status: Never Smoker  . Smokeless tobacco: Never Used  Vaping Use  . Vaping Use: Never used  Substance Use Topics  . Alcohol use: Not Currently  . Drug use: Never     Family History  Problem Relation Age of Onset  . Coronary artery disease Father 73  . Diabetes Father   . Coronary artery disease Mother   . Diabetes Mother   . Diabetes Sister   . Diabetes Sister     Allergies  Allergen Reactions  . Escitalopram Diarrhea  . Meloxicam Other (See Comments)    insomnia     REVIEW OF SYSTEMS (Negative unless checked) Review of systems is difficult to obtain due to the patient's mental status and confusion but clearly positive issues are listed below Constitutional: []Weight loss  []Fever  []Chills Cardiac: []Chest pain   []Chest pressure   []Palpitations   []Shortness of breath when  laying flat   []Shortness of breath at rest   []Shortness of breath with exertion. Vascular:  []Pain in legs with walking   [x]Pain in legs at rest   [x]Pain in legs   when laying flat   [] Claudication   [] Pain in feet when walking  [] Pain in feet at rest  [] Pain in feet when laying flat   [x] History of DVT   [] Phlebitis   [x] Swelling in legs   [] Varicose veins   [] Non-healing ulcers Pulmonary:   [] Uses home oxygen   [] Productive cough   [] Hemoptysis   [] Wheeze  [] COPD   [] Asthma Neurologic:  [] Dizziness  [] Blackouts   [] Seizures   [] History of stroke   [] History of TIA  [] Aphasia   [] Temporary blindness   [] Dysphagia   [] Weakness or numbness in arms   [] Weakness or numbness in legs X positive for dementia and memory issues Musculoskeletal:  [x] Arthritis   [] Joint swelling   [x] Joint pain   [] Low back pain Hematologic:  [] Easy bruising  [] Easy bleeding   [] Hypercoagulable state   [] Anemic  [] Hepatitis Gastrointestinal:  [] Blood in stool   [] Vomiting blood  [] Gastroesophageal reflux/heartburn   [] Difficulty swallowing. Genitourinary:  [] Chronic kidney disease   [] Difficult urination  [] Frequent urination  [] Burning with urination   [] Blood in urine Skin:  [] Rashes   [] Ulcers   [] Wounds Psychological:  [x] History of anxiety   [x]  History of major depression.  Physical Examination  Vitals:   01/12/20 2337 01/13/20 0230 01/13/20 0544 01/13/20 0700  BP: 103/62 98/63 108/66 (!) 144/67  Pulse: 67 60 78 65  Resp: 18 15 16    Temp:      TempSrc:      SpO2: 100% 98% 98% 98%  Weight:      Height:       Body mass index is 23.3 kg/m. Gen:  WD/WN, NAD Head: [place]/AT, No temporalis wasting.  Ear/Nose/Throat: Hearing grossly intact, nares w/o erythema or drainage, oropharynx w/o Erythema/Exudate Eyes: Sclera non-icteric, conjunctiva clear Neck: Trachea midline.  No JVD.  Pulmonary:  Good air movement, respirations not labored, equal bilaterally.  Cardiac: Irregular Vascular:  Vessel Right Left  Radial  Palpable Palpable                          PT  1+ palpable  not palpable  DP  1+ palpable  1+ palpable   Gastrointestinal: soft, non-tender/non-distended. No guarding/reflex.  Musculoskeletal: M/S 5/5 throughout.  Extremities without ischemic changes.  No deformity or atrophy.  No significant swelling in the right leg.  Left leg has 3+ edema.  It is warm with capillary refill present. Neurologic: Sensation grossly intact in extremities.  Symmetrical.  Speech is fluent. Motor exam as listed above. Psychiatric: Judgment and insight are poor.  Patient is a poor historian. Dermatologic: No rashes or ulcers noted.  No cellulitis or open wounds.       CBC Lab Results  Component Value Date   WBC 8.8 01/13/2020   HGB 9.6 (L) 01/13/2020   HCT 28.7 (L) 01/13/2020   MCV 91.4 01/13/2020   PLT 256 01/13/2020    BMET    Component Value Date/Time   NA 138 01/13/2020 0544   NA 137 09/07/2015 0000   K 4.1 01/13/2020 0544   CL 108 01/13/2020 0544   CO2 23 01/13/2020 0544   GLUCOSE 105 (H) 01/13/2020 0544   BUN 36 (H) 01/13/2020 0544   BUN 16 09/07/2015 0000   CREATININE 0.82 01/13/2020 0544   CALCIUM 8.1 (L) 01/13/2020 0544   GFRNONAA >60 01/13/2020 0544   GFRAA >60 10/27/2018 0220   Estimated Creatinine Clearance: 45.1 mL/min (by C-G formula based on SCr of 0.82  mg/dL).  COAG Lab Results  Component Value Date   INR 1.2 01/12/2020   INR 1.02 03/11/2012   INR 0.99 12/10/2009    Radiology CT HEAD WO CONTRAST  Result Date: 01/12/2020 CLINICAL DATA:  Larey SeatFell 2 days ago, loss of consciousness, hit head, dimension EXAM: CT HEAD WITHOUT CONTRAST TECHNIQUE: Contiguous axial images were obtained from the base of the skull through the vertex without intravenous contrast. COMPARISON:  10/26/2018 FINDINGS: Brain: No acute infarct or hemorrhage. Hypodensities in the bilateral basal ganglia consistent with chronic lacunar infarcts, stable. Lateral ventricles and remaining midline structures  are unremarkable. No acute extra-axial fluid collections. No mass effect. Vascular: Stable atherosclerosis of the internal carotid arteries. No hyperdense vessel. Skull: Normal. Negative for fracture or focal lesion. Sinuses/Orbits: No acute finding. Other: None. IMPRESSION: 1. Stable exam, no acute intracranial process. Electronically Signed   By: Sharlet SalinaMichael  Brown M.D.   On: 01/12/2020 17:14   US Venous Img Lower Unilateral Left  Result Date: 01/12/2020 CLINICAL DATA:  Initial evaluation for acute swelling. EXAM: LEFT LOWER EXTREMITY VENOUS DOPPLER ULTRASOUND TECHNIQUE: Gray-scale sonography with graded compression, as well as color Doppler and duplex ultrasound were performed to evaluate the lower extremity deep venous systems from the level of the common femoral vein and including the common femoral, femoral, profunda femoral, popliteal and calf veins including the posterior tibial, peroneal and gastrocnemius veins when visible. The superficial great saphenous vein was also interrogated. Spectral Doppler was utilized to evaluate flow at rest and with distal augmentation maneuvers in the common femoral, femoral and popliteal veins. COMPARISON:  Prior ultrasound from 10/18/2005. FINDINGS: Contralateral Common Femoral Vein: Respiratory phasicity is normal and symmetric with the symptomatic side. No evidence of thrombus. Normal compressibility. Common Femoral Vein: Occlusive echogenic thrombus seen throughout the left common femoral vein. Saphenofemoral Junction: Occlusive thrombus throughout the left saphenofemoral junction. Profunda Femoral Vein: Occlusive echogenic thrombus throughout the deep left femoral vein. Femoral Vein: Occlusive echogenic thrombus throughout the left femoral vein. Popliteal Vein: Occlusive echogenic thrombus throughout the left popliteal vein. Calf Veins: Occlusive echogenic thrombus throughout the visualized veins of the calf. Superficial Great Saphenous Vein: Visualized left great  saphenous vein appears patent. Other Findings: Echogenic occlusive thrombus extends into the visualized left external iliac vein. The left common iliac vein is not visualized on this exam. Visualized proximal-mid IVC appears to be grossly patent. IMPRESSION: Extensive acute occlusive DVT extending from the left external iliac vein through the veins of the left calf. Electronically Signed   By: Rise MuBenjamin  McClintock M.D.   On: 01/12/2020 20:38      Assessment/Plan 1.  Extensive left lower extremity DVT. Ultrasound has been independently reviewed and she has extensive left lower extremity DVT all the way up to the iliofemoral segments.  Her leg is very painful and swollen.  She would clearly benefit from thrombectomy.  She does not quite have phlegmasia cerulea dolens, but her symptoms are quite impressive and the DVT is quite extensive.  We may give a small amount of TPA as well.  We will plan on doing this this afternoon.  Patient has been made n.p.o. and the procedure has been discussed with risks and benefits 2.  UTI.  May explain some of her worsening mental status.  On antibiotics 3.  Dementia.  Seems to be fairly significant.  No behavioral disturbances seen yet. 4.  Hypertension. blood pressure control important in reducing the progression of atherosclerotic disease. On appropriate oral medications. 5.  Hyperlipidemia. lipid control important in  reducing the progression of atherosclerotic disease. Continue statin therapy    Festus Barren, MD  01/13/2020 7:58 AM    This note was created with Dragon medical transcription system.  Any error is purely unintentional

## 2020-01-13 NOTE — Progress Notes (Signed)
Patient is alert to name, intermittent to place and time.  NPO since midnight. Vitals at baseline for patient. x2 PIV patent/intact. LLE edema noted, pulses doppler. No c/o's at this time.

## 2020-01-13 NOTE — Progress Notes (Signed)
Unable to complete admission profile due to pt being confused. No family at bedside. Contacted Mac Tedrick (son)  x2, no answer.

## 2020-01-13 NOTE — Progress Notes (Signed)
ANTICOAGULATION CONSULT NOTE  Pharmacy Consult for heparin infusion Indication: VTE Treatment  Allergies  Allergen Reactions  . Escitalopram Diarrhea  . Meloxicam Other (See Comments)    insomnia    Patient Measurements: Height: 5\' 5"  (165.1 cm) Weight: 63.5 kg (140 lb) IBW/kg (Calculated) : 57 Heparin Dosing Weight: 63.5 kg  Vital Signs: BP: 144/67 (12/20 0700) Pulse Rate: 65 (12/20 0700)  Labs: Recent Labs    01/12/20 1626 01/12/20 2224 01/13/20 0544  HGB 9.8*  --  9.6*  HCT 29.9*  --  28.7*  PLT 306  --  256  APTT  --  135*  --   LABPROT  --  14.6  --   INR  --  1.2  --   HEPARINUNFRC  --   --  0.29*  CREATININE 1.15*  --  0.82    Estimated Creatinine Clearance: 45.1 mL/min (by C-G formula based on SCr of 0.82 mg/dL).   Medical History: Past Medical History:  Diagnosis Date  . Anxiety   . Anxiety and depression   . Arthritis    "hands, back" (05/04/2017)  . Chronic back pain   . Colitis 05/03/2017  . Depression   . Diverticulosis of colon (without mention of hemorrhage)   . Hiatal hernia   . History of blood transfusion 07/1974   "related to childbirth"  . Hyperlipidemia   . Hypertension   . Non compliance w medication regimen   . Other and unspecified noninfectious gastroenteritis and colitis(558.9)   . Pneumonia    "several times; same time q yr in [place]" (05/04/2017)    Assesment:  86 yo female with  extensive left lower extremity DVT with planned vascular procedure today.  Pharmacy has been consulted to monitor heparin drip for VTE treatment.  Patient given 4000 units bolus x 1, and heparin infusion started at 1100 units/hr  12/20 0544 HL 0.29 - subtherapeutic  Goal of Therapy:  Heparin level 0.3-0.7 units/ml Monitor platelets by anticoagulation protocol: Yes    Plan:  Pt mildly subtherapeutic, will increase drip rate to 1200 units/hr and recheck HL in 8 hours per protocol.   1/21, PharmD, BCPS Clinical  Pharmacist 01/13/2020 9:21 AM

## 2020-01-13 NOTE — Progress Notes (Signed)
OT Cancellation Note  Patient Details Name: VALMAI VANDENBERGHE MRN: [id number] DOB: Nov 24, 1932   Cancelled Treatment:    Reason Eval/Treat Not Completed: Medical issues which prohibited therapy. OT order received and chart reviewed. Per chart review and vascular MD, plan is to perform thrombectomy due to extensive LLE DVT. OT to hold on initiation of therapy services.   Jackquline Denmark, MS, OTR/L , CBIS ascom [phone]  01/13/20, 11:34 AM    01/13/2020, 11:33 AM

## 2020-01-13 NOTE — Interval H&P Note (Signed)
History and Physical Interval Note:  01/13/2020 3:40 PM  [name]  has presented today for surgery, with the diagnosis of left leg DVT.  The various methods of treatment have been discussed with the patient and family. After consideration of risks, benefits and other options for treatment, the patient has consented to  Procedure(s): PERIPHERAL VASCULAR THROMBECTOMY (Left) as a surgical intervention.  The patient's history has been reviewed, patient examined, no change in status, stable for surgery.  I have reviewed the patient's chart and labs.  Questions were answered to the patient's satisfaction.     Festus Barren

## 2020-01-13 NOTE — Progress Notes (Signed)
PT Cancellation Note  Patient Details Name: JUNELL CULLIFER MRN: [id number] DOB: [birth year]   Cancelled Treatment:    Reason Eval/Treat Not Completed: Other (comment);Medical issues which prohibited therapy. Per chart review and vascular MD, plan is to perform thrombectomy due to extensive LLE DVT. PT to hold on initiation of therapy services.   Olga Coaster PT, DPT 11:27 AM,01/13/20

## 2020-01-13 NOTE — Consult Note (Signed)
[facility] Vascular Consult Note  MRN : [id number]  [name] is a 86 y.o. (Sep 13, 1932) female who presents with chief complaint of  Chief Complaint  Patient presents with  . Leg Pain  . Head Injury  .  History of Present Illness: We are asked to see the patient by Dr. Derrill Kay in the emergency department as well as the admitting hospitalist for extensive left lower extremity DVT.  The patient is an 86 year old pleasantly demented female who can provide very little history.  She does not know how long her legs been hurting.  She has been confused more than her baseline.  She was brought to the hospital from her facility where she was found to have a UTI and an extensive left lower extremity DVT.  Her left leg is quite swollen.  She does report pain in the left leg.  It hurts the entire leg.  Ultrasound has been independently reviewed and she has extensive left lower extremity DVT all the way up to the iliofemoral segments.  Current Facility-Administered Medications  Medication Dose Route Frequency Provider Last Rate Last Admin  . 0.9 %  sodium chloride infusion   Intravenous Continuous Opyd, Lavone Neri, MD 100 mL/hr at 01/12/20 2222 New Bag at 01/12/20 2222  . acetaminophen (TYLENOL) tablet 650 mg  650 mg Oral Q6H PRN Opyd, Lavone Neri, MD       Or  . acetaminophen (TYLENOL) suppository 650 mg  650 mg Rectal Q6H PRN Opyd, Lavone Neri, MD      . albuterol (PROVENTIL) (2.5 MG/3ML) 0.083% nebulizer solution 2.5 mg  2.5 mg Nebulization Q6H PRN Opyd, Lavone Neri, MD      . cefTRIAXone (ROCEPHIN) 1 g in sodium chloride 0.9 % 100 mL IVPB  1 g Intravenous Q24H Opyd, Lavone Neri, MD      . clonazePAM (KLONOPIN) tablet 1 mg  1 mg Oral BID Opyd, Lavone Neri, MD   1 mg at 01/12/20 2331  . heparin ADULT infusion 100 units/mL (25000 units/281mL sodium chloride 0.45%)  1,100 Units/hr Intravenous Continuous Otelia Sergeant, RPH 11 mL/hr at 01/12/20 2217 1,100 Units/hr at 01/12/20 2217  .  HYDROcodone-acetaminophen (NORCO/VICODIN) 5-325 MG per tablet 1 tablet  1 tablet Oral Q6H PRN Opyd, Lavone Neri, MD      . memantine (NAMENDA) tablet 10 mg  10 mg Oral Daily Opyd, Lavone Neri, MD      . metoprolol succinate (TOPROL-XL) 24 hr tablet 100 mg  100 mg Oral Daily Opyd, Lavone Neri, MD      . ondansetron (ZOFRAN) tablet 4 mg  4 mg Oral Q6H PRN Opyd, Lavone Neri, MD       Or  . ondansetron (ZOFRAN) injection 4 mg  4 mg Intravenous Q6H PRN Opyd, Lavone Neri, MD      . senna-docusate (Senokot-S) tablet 1 tablet  1 tablet Oral QHS PRN Opyd, Lavone Neri, MD       Current Outpatient Medications  Medication Sig Dispense Refill  . acetaminophen (TYLENOL) 650 MG CR tablet Take 1 tablet (650 mg total) by mouth every 8 (eight) hours as needed for pain.    . clonazePAM (KLONOPIN) 1 MG tablet Take 1 tablet by mouth 2 (two) times daily.    Marland Kitchen HYDROcodone-acetaminophen (NORCO/VICODIN) 5-325 MG tablet Take 1 tablet by mouth 3 (three) times daily as needed (pain). 10 tablet 0  . losartan (COZAAR) 50 MG tablet Take 50 mg by mouth daily.    . memantine (NAMENDA)  10 MG tablet Take 10 mg by mouth daily.    . metoprolol succinate (TOPROL-XL) 100 MG 24 hr tablet Take 100 mg by mouth daily.    Marland Kitchen OVER THE COUNTER MEDICATION Take 1 tablet by mouth at bedtime. [facility] over the counter sleeping pill    . OVER THE COUNTER MEDICATION Take 15 mLs by mouth at bedtime. [facility] liquid cough syrup - night time    . albuterol (PROVENTIL) (2.5 MG/3ML) 0.083% nebulizer solution Take 2.5 mg by nebulization every 6 (six) hours as needed for wheezing or shortness of breath. (Patient not taking: No sig reported)    . fluticasone (FLONASE) 50 MCG/ACT nasal spray Place 1 spray into both nostrils 2 (two) times daily. (Patient not taking: No sig reported)    . loratadine (CLARITIN) 10 MG tablet Take 10 mg by mouth daily. (Patient not taking: No sig reported)    . meloxicam (MOBIC) 7.5 MG tablet Take 7.5 mg by mouth daily. For back pain (Patient not  taking: No sig reported)      Past Medical History:  Diagnosis Date  . Anxiety   . Anxiety and depression   . Arthritis    "hands, back" (05/04/2017)  . Chronic back pain   . Colitis 05/03/2017  . Depression   . Diverticulosis of colon (without mention of hemorrhage)   . Hiatal hernia   . History of blood transfusion 07/1974   "related to childbirth"  . Hyperlipidemia   . Hypertension   . Non compliance w medication regimen   . Other and unspecified noninfectious gastroenteritis and colitis(558.9)   . Pneumonia    "several times; same time q yr in [place]" (05/04/2017)    Past Surgical History:  Procedure Laterality Date  . BACK SURGERY    . CESAREAN SECTION    . EXCISIONAL HEMORRHOIDECTOMY    . HERNIA REPAIR    . INCONTINENCE SURGERY    . JOINT REPLACEMENT    . LUMBAR SPINE SURGERY  2009  . MOLE REMOVAL  04/2017   "off my back" (05/04/2017)  . SHOULDER ARTHROSCOPY Left 2007  . SPINAL FIXATION SURGERY  Multiple   "on my back and neck"  . TOTAL KNEE ARTHROPLASTY  2007   left  . VAGINAL HYSTERECTOMY    . VENTRAL HERNIA REPAIR  2003     Social History   Tobacco Use  . Smoking status: Never Smoker  . Smokeless tobacco: Never Used  Vaping Use  . Vaping Use: Never used  Substance Use Topics  . Alcohol use: Not Currently  . Drug use: Never     Family History  Problem Relation Age of Onset  . Coronary artery disease Father 68  . Diabetes Father   . Coronary artery disease Mother   . Diabetes Mother   . Diabetes Sister   . Diabetes Sister     Allergies  Allergen Reactions  . Escitalopram Diarrhea  . Meloxicam Other (See Comments)    insomnia     REVIEW OF SYSTEMS (Negative unless checked) Review of systems is difficult to obtain due to the patient's mental status and confusion but clearly positive issues are listed below Constitutional: Weight loss  Fever  Chills Cardiac: Chest pain   Chest pressure   Palpitations   Shortness of breath when  laying flat   Shortness of breath at rest   Shortness of breath with exertion. Vascular:  Pain in legs with walking   Pain in legs at rest   Pain in legs  when laying flat   [] Claudication   [] Pain in feet when walking  [] Pain in feet at rest  [] Pain in feet when laying flat   [x] History of DVT   [] Phlebitis   [x] Swelling in legs   [] Varicose veins   [] Non-healing ulcers Pulmonary:   [] Uses home oxygen   [] Productive cough   [] Hemoptysis   [] Wheeze  [] COPD   [] Asthma Neurologic:  [] Dizziness  [] Blackouts   [] Seizures   [] History of stroke   [] History of TIA  [] Aphasia   [] Temporary blindness   [] Dysphagia   [] Weakness or numbness in arms   [] Weakness or numbness in legs X positive for dementia and memory issues Musculoskeletal:  [x] Arthritis   [] Joint swelling   [x] Joint pain   [] Low back pain Hematologic:  [] Easy bruising  [] Easy bleeding   [] Hypercoagulable state   [] Anemic  [] Hepatitis Gastrointestinal:  [] Blood in stool   [] Vomiting blood  [] Gastroesophageal reflux/heartburn   [] Difficulty swallowing. Genitourinary:  [] Chronic kidney disease   [] Difficult urination  [] Frequent urination  [] Burning with urination   [] Blood in urine Skin:  [] Rashes   [] Ulcers   [] Wounds Psychological:  [x] History of anxiety   [x]  History of major depression.  Physical Examination  Vitals:   01/12/20 2337 01/13/20 0230 01/13/20 0544 01/13/20 0700  BP: 103/62 98/63 108/66 (!) 144/67  Pulse: 67 60 78 65  Resp: 18 15 16    Temp:      TempSrc:      SpO2: 100% 98% 98% 98%  Weight:      Height:       Body mass index is 23.3 kg/m. Gen:  WD/WN, NAD Head: /AT, No temporalis wasting.  Ear/Nose/Throat: Hearing grossly intact, nares w/o erythema or drainage, oropharynx w/o Erythema/Exudate Eyes: Sclera non-icteric, conjunctiva clear Neck: Trachea midline.  No JVD.  Pulmonary:  Good air movement, respirations not labored, equal bilaterally.  Cardiac: Irregular Vascular:  Vessel Right Left  Radial  Palpable Palpable                          PT  1+ palpable  not palpable  DP  1+ palpable  1+ palpable   Gastrointestinal: soft, non-tender/non-distended. No guarding/reflex.  Musculoskeletal: M/S 5/5 throughout.  Extremities without ischemic changes.  No deformity or atrophy.  No significant swelling in the right leg.  Left leg has 3+ edema.  It is warm with capillary refill present. Neurologic: Sensation grossly intact in extremities.  Symmetrical.  Speech is fluent. Motor exam as listed above. Psychiatric: Judgment and insight are poor.  Patient is a poor historian. Dermatologic: No rashes or ulcers noted.  No cellulitis or open wounds.       CBC Lab Results  Component Value Date   WBC 8.8 01/13/2020   HGB 9.6 (L) 01/13/2020   HCT 28.7 (L) 01/13/2020   MCV 91.4 01/13/2020   PLT 256 01/13/2020    BMET    Component Value Date/Time   NA 138 01/13/2020 0544   NA 137 09/07/2015 0000   K 4.1 01/13/2020 0544   CL 108 01/13/2020 0544   CO2 23 01/13/2020 0544   GLUCOSE 105 (H) 01/13/2020 0544   BUN 36 (H) 01/13/2020 0544   BUN 16 09/07/2015 0000   CREATININE 0.82 01/13/2020 0544   CALCIUM 8.1 (L) 01/13/2020 0544   GFRNONAA >60 01/13/2020 0544   GFRAA >60 10/27/2018 0220   Estimated Creatinine Clearance: 45.1 mL/min (by C-G formula based on SCr of 0.82  mg/dL).  COAG Lab Results  Component Value Date   INR 1.2 01/12/2020   INR 1.02 03/11/2012   INR 0.99 12/10/2009    Radiology CT HEAD WO CONTRAST  Result Date: 01/12/2020 CLINICAL DATA:  Larey SeatFell 2 days ago, loss of consciousness, hit head, dimension EXAM: CT HEAD WITHOUT CONTRAST TECHNIQUE: Contiguous axial images were obtained from the base of the skull through the vertex without intravenous contrast. COMPARISON:  10/26/2018 FINDINGS: Brain: No acute infarct or hemorrhage. Hypodensities in the bilateral basal ganglia consistent with chronic lacunar infarcts, stable. Lateral ventricles and remaining midline structures  are unremarkable. No acute extra-axial fluid collections. No mass effect. Vascular: Stable atherosclerosis of the internal carotid arteries. No hyperdense vessel. Skull: Normal. Negative for fracture or focal lesion. Sinuses/Orbits: No acute finding. Other: None. IMPRESSION: 1. Stable exam, no acute intracranial process. Electronically Signed   By: Sharlet SalinaMichael  Brown M.D.   On: 01/12/2020 17:14   US Venous Img Lower Unilateral Left  Result Date: 01/12/2020 CLINICAL DATA:  Initial evaluation for acute swelling. EXAM: LEFT LOWER EXTREMITY VENOUS DOPPLER ULTRASOUND TECHNIQUE: Gray-scale sonography with graded compression, as well as color Doppler and duplex ultrasound were performed to evaluate the lower extremity deep venous systems from the level of the common femoral vein and including the common femoral, femoral, profunda femoral, popliteal and calf veins including the posterior tibial, peroneal and gastrocnemius veins when visible. The superficial great saphenous vein was also interrogated. Spectral Doppler was utilized to evaluate flow at rest and with distal augmentation maneuvers in the common femoral, femoral and popliteal veins. COMPARISON:  Prior ultrasound from 10/18/2005. FINDINGS: Contralateral Common Femoral Vein: Respiratory phasicity is normal and symmetric with the symptomatic side. No evidence of thrombus. Normal compressibility. Common Femoral Vein: Occlusive echogenic thrombus seen throughout the left common femoral vein. Saphenofemoral Junction: Occlusive thrombus throughout the left saphenofemoral junction. Profunda Femoral Vein: Occlusive echogenic thrombus throughout the deep left femoral vein. Femoral Vein: Occlusive echogenic thrombus throughout the left femoral vein. Popliteal Vein: Occlusive echogenic thrombus throughout the left popliteal vein. Calf Veins: Occlusive echogenic thrombus throughout the visualized veins of the calf. Superficial Great Saphenous Vein: Visualized left great  saphenous vein appears patent. Other Findings: Echogenic occlusive thrombus extends into the visualized left external iliac vein. The left common iliac vein is not visualized on this exam. Visualized proximal-mid IVC appears to be grossly patent. IMPRESSION: Extensive acute occlusive DVT extending from the left external iliac vein through the veins of the left calf. Electronically Signed   By: Rise MuBenjamin  McClintock M.D.   On: 01/12/2020 20:38      Assessment/Plan 1.  Extensive left lower extremity DVT. Ultrasound has been independently reviewed and she has extensive left lower extremity DVT all the way up to the iliofemoral segments.  Her leg is very painful and swollen.  She would clearly benefit from thrombectomy.  She does not quite have phlegmasia cerulea dolens, but her symptoms are quite impressive and the DVT is quite extensive.  We may give a small amount of TPA as well.  We will plan on doing this this afternoon.  Patient has been made n.p.o. and the procedure has been discussed with risks and benefits 2.  UTI.  May explain some of her worsening mental status.  On antibiotics 3.  Dementia.  Seems to be fairly significant.  No behavioral disturbances seen yet. 4.  Hypertension. blood pressure control important in reducing the progression of atherosclerotic disease. On appropriate oral medications. 5.  Hyperlipidemia. lipid control important in  reducing the progression of atherosclerotic disease. Continue statin therapy    Festus Barren, MD  01/13/2020 7:58 AM    This note was created with Dragon medical transcription system.  Any error is purely unintentional

## 2020-01-14 ENCOUNTER — Encounter: Payer: Self-pay | Admitting: Vascular Surgery

## 2020-01-14 DIAGNOSIS — I82422 Acute embolism and thrombosis of left iliac vein: Secondary | ICD-10-CM | POA: Diagnosis not present

## 2020-01-14 LAB — CBC
HCT: 26.2 % — ABNORMAL LOW (ref 36.0–46.0)
Hemoglobin: 8.7 g/dL — ABNORMAL LOW (ref 12.0–15.0)
MCH: 30.1 pg (ref 26.0–34.0)
MCHC: 33.2 g/dL (ref 30.0–36.0)
MCV: 90.7 fL (ref 80.0–100.0)
Platelets: 233 10*3/uL (ref 150–400)
RBC: 2.89 MIL/uL — ABNORMAL LOW (ref 3.87–5.11)
RDW: 14.6 % (ref 11.5–15.5)
WBC: 7.4 10*3/uL (ref 4.0–10.5)
nRBC: 0 % (ref 0.0–0.2)

## 2020-01-14 LAB — BASIC METABOLIC PANEL
Anion gap: 7 (ref 5–15)
BUN: 31 mg/dL — ABNORMAL HIGH (ref 8–23)
CO2: 23 mmol/L (ref 22–32)
Calcium: 8 mg/dL — ABNORMAL LOW (ref 8.9–10.3)
Chloride: 109 mmol/L (ref 98–111)
Creatinine, Ser: 0.82 mg/dL (ref 0.44–1.00)
GFR, Estimated: >60 mL/min (ref >60)
Glucose, Bld: 144 mg/dL — ABNORMAL HIGH (ref 70–99)
Potassium: 4.4 mmol/L (ref 3.5–5.1)
Sodium: 139 mmol/L (ref 135–145)

## 2020-01-14 LAB — HEPARIN LEVEL (UNFRACTIONATED): Heparin Unfractionated: 0.15 IU/mL — ABNORMAL LOW (ref 0.30–0.70)

## 2020-01-14 MED ORDER — APIXABAN 5 MG PO TABS: 5.0000 mg | ORAL_TABLET | Freq: Two times a day (BID) | ORAL | Status: DC

## 2020-01-14 MED ORDER — PHENAZOPYRIDINE HCL 100 MG PO TABS
100.0000 mg | ORAL_TABLET | Freq: Three times a day (TID) | ORAL | Status: DC
  Administered 2020-01-14 – 2020-01-15 (×2): 100 mg via ORAL
  Filled 2020-01-14 (×5): qty 1

## 2020-01-14 MED ORDER — APIXABAN 5 MG PO TABS
10.0000 mg | ORAL_TABLET | Freq: Two times a day (BID) | ORAL | Status: DC
  Administered 2020-01-14 – 2020-01-15 (×2): 10 mg via ORAL
  Filled 2020-01-14 (×2): qty 2

## 2020-01-14 MED ORDER — ESTROGENS, CONJUGATED 0.625 MG/GM VA CREA
1.0000 | TOPICAL_CREAM | Freq: Every day | VAGINAL | Status: DC
  Filled 2020-01-14: qty 30

## 2020-01-14 NOTE — TOC Initial Note (Signed)
Transition of Care [facility]) - Initial/Assessment Note    Patient Details  Name: [name] MRN: [id number] Date of Birth: 18-Nov-1932  [facility] CM/SW Contact:    Candie Chroman, LCSW Phone Number: 01/14/2020, 3:14 PM  Clinical Narrative: CSW met with patient. No supports at bedside but she was on the phone with a friend/family member. CSW introduced role and explained that PT recommendations would be discussed. Patient is not interested in home health at this time, stating she has had it in the past and can do it on her own. No further concerns. CSW encouraged patient to contact CSW as needed. CSW will continue to follow patient for support and facilitate return home when stable.              Expected Discharge Plan: Home/Self Care Barriers to Discharge: Continued Medical Work up   Patient Goals and CMS Choice        Expected Discharge Plan and Services Expected Discharge Plan: Home/Self Care       Living arrangements for the past 2 months: Single Family Home                                      Prior Living Arrangements/Services Living arrangements for the past 2 months: Single Family Home Lives with:: Adult Children Patient language and need for interpreter reviewed:: Yes Do you feel safe going back to the place where you live?: Yes      Need for Family Participation in Patient Care: Yes (Comment) Care giver support system in place?: Yes (comment)   Criminal Activity/Legal Involvement Pertinent to Current Situation/Hospitalization: No - Comment as needed  Activities of Daily Living   ADL Screening (condition at time of admission) Patient's cognitive ability adequate to safely complete daily activities?: Yes Is the patient deaf or have difficulty hearing?: No Does the patient have difficulty seeing, even when wearing glasses/contacts?: No Does the patient have difficulty concentrating, remembering, or making decisions?: Yes Patient able to  express need for assistance with ADLs?: No Does the patient have difficulty dressing or bathing?: No Independently performs ADLs?: Yes (appropriate for developmental age) Does the patient have difficulty walking or climbing stairs?: Yes Weakness of Legs: Both Weakness of Arms/Hands: None  Permission Sought/Granted                  Emotional Assessment Appearance:: Appears stated age Attitude/Demeanor/Rapport: Engaged,Gracious Affect (typically observed): Accepting,Appropriate,Calm,Pleasant Orientation: : Oriented to Self,Oriented to Place,Oriented to  Time,Oriented to Situation Alcohol / Substance Use: Not Applicable Psych Involvement: No (comment)  Admission diagnosis:  Lower urinary tract infection [N39.0] Acute deep vein thrombosis (DVT) of proximal vein of left lower extremity (HCC) [I82.4Y2] Acute deep vein thrombosis (DVT) of iliac vein of left lower extremity (HCC) [I82.422] Deep vein thrombosis (DVT) of popliteal vein, unspecified chronicity, unspecified laterality ([place]) [I82.439] Patient Active Problem List   Diagnosis Date Noted  . Acute deep vein thrombosis (DVT) of iliac vein of left lower extremity ([place]) 01/12/2020  . Deep vein thrombosis (DVT) of popliteal vein (HCC) 01/12/2020  . Mild renal insufficiency   . Lower urinary tract infection   . Normocytic anemia   . Dementia ([place]) 10/29/2018  . Major depressive disorder, recurrent episode, mild ([place]) 10/29/2018  . Encephalopathy acute 10/28/2018  . Acute encephalopathy 10/27/2018  . Lower GI bleed   . Hypokalemia 05/04/2017  . Syncope, vasovagal 05/04/2017  .  Nausea, vomiting, and diarrhea 05/04/2017  . Melena 05/04/2017  . Colitis 05/03/2017  . Hypothyroidism 10/10/2015  . Adjustment disorder with mixed anxiety and depressed mood 10/10/2015  . Sleeping difficulty 10/10/2015  . Mixed hyperlipidemia 10/10/2015  . Chronic Chest pain- unknwn etiology- noncardiogenic 10/10/2015  . Generalized OA 10/10/2015  .  Overweight (BMI 25.0-29.9) 10/10/2015  . History of noncompliance with medical treatment 10/10/2015  . Hypertension 10/08/2015  . Diverticulosis of colon (without mention of hemorrhage) 10/08/2015  . Anxiety and depression 10/08/2015  . GAD (generalized anxiety disorder) 10/08/2015   PCP:  [facility]:   [id number] [id number]: [phone]: [phone]    Social Determinants of Health (SDOH) Interventions    Readmission Risk Interventions No flowsheet data found.

## 2020-01-14 NOTE — Progress Notes (Signed)
[facility]  Daily Progress Note   Subjective  -   Doing well.  Leg not hurting as much.  Far less swollen.  A little less confused  Objective Vitals:   01/13/20 1815 01/13/20 1948 01/14/20 0416 01/14/20 0824  BP: 128/67 105/68 124/74 131/63  Pulse:  66 68 73  Resp: 17 20 20 19   Temp:  97.8 F (36.6 C) 98.4 F (36.9 C) 99 F (37.2 C)  TempSrc:  Oral Oral Oral  SpO2:  98% 97% 99%  Weight:      Height:        Intake/Output Summary (Last 24 hours) at 01/14/2020 0830 Last data filed at 01/14/2020 0538 Gross per 24 hour  Intake 593.09 ml  Output 625 ml  Net -31.91 ml    PULM  CTAB CV  RRR VASC  Mild swelling left leg.  Laboratory CBC    Component Value Date/Time   WBC 7.4 01/14/2020 0534   HGB 8.7 (L) 01/14/2020 0534   HCT 26.2 (L) 01/14/2020 0534   PLT 233 01/14/2020 0534    BMET    Component Value Date/Time   NA 139 01/14/2020 0534   NA 137 09/07/2015 0000   K 4.4 01/14/2020 0534   CL 109 01/14/2020 0534   CO2 23 01/14/2020 0534   GLUCOSE 144 (H) 01/14/2020 0534   BUN 31 (H) 01/14/2020 0534   BUN 16 09/07/2015 0000   CREATININE 0.82 01/14/2020 0534   CALCIUM 8.0 (L) 01/14/2020 0534   GFRNONAA >60 01/14/2020 0534   GFRAA >60 10/27/2018 0220    Assessment/Planning: POD #1 s/p venous thrombolysis/thrombectomy, PTA   Leg much less swollen  OK to remove wrap and ambulate patient  OK to transition from heparin to Eliquis from my point of view  Maybe discharge back to SNF tomorrow    12/27/2018  01/14/2020, 8:30 AM

## 2020-01-14 NOTE — Progress Notes (Signed)
ANTICOAGULATION CONSULT NOTE  Pharmacy Consult for heparin infusion Indication: VTE Treatment  Allergies  Allergen Reactions  . Escitalopram Diarrhea  . Meloxicam Other (See Comments)    insomnia   Patient Measurements: Height: 5\' 5"  (165.1 cm) Weight: 63.5 kg (139 lb 15.9 oz) IBW/kg (Calculated) : 57 Heparin Dosing Weight: 63.5 kg  Vital Signs: Temp: 98.4 F (36.9 C) (12/21 0416) Temp Source: Oral (12/21 0416) BP: 124/74 (12/21 0416) Pulse Rate: 68 (12/21 0416)  Labs: Recent Labs    01/12/20 1626 01/12/20 2224 01/13/20 0544 01/14/20 0534  HGB 9.8*  --  9.6* 8.7*  HCT 29.9*  --  28.7* 26.2*  PLT 306  --  256 233  APTT  --  135*  --   --   LABPROT  --  14.6  --   --   INR  --  1.2  --   --   HEPARINUNFRC  --   --  0.29* 0.15*  CREATININE 1.15*  --  0.82 0.82    Estimated Creatinine Clearance: 45.1 mL/min (by C-G formula based on SCr of 0.82 mg/dL).  Medical History: Past Medical History:  Diagnosis Date  . Anxiety   . Anxiety and depression   . Arthritis    "hands, back" (05/04/2017)  . Chronic back pain   . Colitis 05/03/2017  . Depression   . Diverticulosis of colon (without mention of hemorrhage)   . Hiatal hernia   . History of blood transfusion 07/1974   "related to childbirth"  . Hyperlipidemia   . Hypertension   . Non compliance w medication regimen   . Other and unspecified noninfectious gastroenteritis and colitis(558.9)   . Pneumonia    "several times; same time q yr in [place]" (05/04/2017)    Assesment:  86 yo female with  extensive left lower extremity DVT with planned vascular procedure today.  Pharmacy has been consulted to monitor heparin drip for VTE treatment.  Patient given 4000 units bolus x 1, and heparin infusion started at 1100 units/hr  12/20 0544 HL 0.29 - subtherapeutic Pt mildly subtherapeutic, will increase drip rate to 1200 units/hr and recheck HL in 8 hours per protocol.  Goal of Therapy:  Heparin level 0.3-0.7  units/ml Monitor platelets by anticoagulation protocol: Yes    Plan:   12/21 @1850 - per RN Heparin to be restarted at 2200 s/p thrombectomy today. Drip was held for procedure.  Will order HL for 8 hrs post restart. CBC in am.  12/21 @ 0534 HL 0.15, SUBtherapeutic.  CBC worse.  Will increase Heparin to 1400 units/hr and recheck HL in 8 hours  , PharmD Clinical Pharmacist 01/14/2020 6:25 AM

## 2020-01-14 NOTE — Evaluation (Signed)
Physical Therapy Evaluation Patient Details Name: [name] MRN: [id number] DOB: 02-Nov-1932 Today's Date: 01/14/2020   History of Present Illness  Pt is an 86 yo female s/p L leg mechanical thrombectomy. Workup also noted UTI. PMH of dementia, anxiety, HTN, COPD, chronic back pain.    Clinical Impression  Patient alert, agreeable to PT. Pt mildly impulsive during session but easily re-directable. Reported she has several homes that she could potentially discharge too; but that her son and his fiance live with her, and they are available 24/7 to assist if needed. Reported 1 fall in the last 6 months, independent for ADLs, fmaily assists with homemaking. Per the patient she has no DME; does not ambulate with a device.  The patient demonstrated bed mobility modI, and good sitting balance, able to stand with and without RW, provided with RW due to LLE pain and ensure safety. She ambulated ~89ft with RW and CGA/supervision, did tend to want to leave RW outside BOS and ambulate without. Pt up in chair with OT at bedside at end of session. The patient demonstrated near return to PLOF, but would benefit from further skilled PT to maximize safety, mobility, and continued education/assessment of potential AD needs.    Follow Up Recommendations Home health PT;Supervision for mobility/OOB    Equipment Recommendations  Rolling walker with 5" wheels    Recommendations for Other Services       Precautions / Restrictions Precautions Precautions: Fall Restrictions Weight Bearing Restrictions: No      Mobility  Bed Mobility Overal bed mobility: Modified Independent                  Transfers Overall transfer level: Needs assistance Equipment used: Rolling walker (2 wheeled) Transfers: Sit to/from Stand Sit to Stand: Supervision            Ambulation/Gait Ambulation/Gait assistance: Min guard;Supervision Gait Distance (Feet): 50 Feet Assistive device: Rolling walker (2  wheeled)       General Gait Details: Pt mildly impulsive during gait, tended to lift RW and wanted to leave RW to return to chair  Stairs            Wheelchair Mobility    Modified Rankin (Stroke Patients Only)       Balance Overall balance assessment: Modified Independent                                           Pertinent Vitals/Pain Pain Assessment: Faces Faces Pain Scale: No hurt    Home Living Family/patient expects to be discharged to:: Private residence Living Arrangements: Children (son, his fiance) Available Help at Discharge: Family;Available 24 hours/day Type of Home: House Home Access: Stairs to enter   [employer] of Steps: either no steps to enter, or 10 with bilateral rails depending on which house she plans on going to at discharge Home Layout: One level   Additional Comments: Pt denied having any DME, stated she has had some but has given them out to loan but has not gotten any returned. 1 fall in the last year, pt reported she passed out at home.    Prior Function Level of Independence: Independent         Comments: family assists with homemaking duties     Hand Dominance   Dominant Hand: Right    Extremity/Trunk Assessment   Upper Extremity Assessment Upper Extremity Assessment:  Defer to OT evaluation    Lower Extremity Assessment Lower Extremity Assessment: Overall WFL for tasks assessed (official MMT deferred but pt able to lift against gravity with excellent control)    Cervical / Trunk Assessment Cervical / Trunk Assessment: Kyphotic  Communication   Communication: No difficulties  Cognition Arousal/Alertness: Awake/alert Behavior During Therapy: Impulsive Overall Cognitive Status: Within Functional Limits for tasks assessed                                 General Comments: oriented to self, place      General Comments      Exercises     Assessment/Plan    PT  Assessment Patient needs continued PT services  PT Problem List Decreased mobility;Decreased safety awareness       PT Treatment Interventions DME instruction;Therapeutic exercise;Balance training;Gait training;Stair training;Neuromuscular re-education;Functional mobility training;Therapeutic activities;Patient/family education    PT Goals (Current goals can be found in the Care Plan section)  Acute Rehab PT Goals Patient Stated Goal: to go home PT Goal Formulation: With patient Time For Goal Achievement: 01/28/20 Potential to Achieve Goals: Good    Frequency Min 2X/week   Barriers to discharge        Co-evaluation               AM-PAC PT "6 Clicks" Mobility  Outcome Measure Help needed turning from your back to your side while in a flat bed without using bedrails?: None Help needed moving from lying on your back to sitting on the side of a flat bed without using bedrails?: None Help needed moving to and from a bed to a chair (including a wheelchair)?: None Help needed standing up from a chair using your arms (e.g., wheelchair or bedside chair)?: None Help needed to walk in hospital room?: A Little Help needed climbing 3-5 steps with a railing? : A Little 6 Click Score: 22    End of Session Equipment Utilized During Treatment: Gait belt Activity Tolerance: Patient tolerated treatment well Patient left: in chair;with chair alarm set;Other (comment);with call bell/phone within reach (OT at bedside) Nurse Communication: Mobility status PT Visit Diagnosis: Other abnormalities of gait and mobility (R26.89);Muscle weakness (generalized) (M62.81);Difficulty in walking, not elsewhere classified (R26.2)    Time: 7672-0947 PT Time Calculation (min) (ACUTE ONLY): 13 min   Charges:   PT Evaluation $PT Eval Low Complexity: 1 Low         Olga Coaster PT, DPT 9:19 AM,01/14/20

## 2020-01-14 NOTE — Evaluation (Signed)
Occupational Therapy Evaluation Patient Details Name: [name] MRN: [id number] DOB: [birth year] Today's Date: 01/14/2020    History of Present Illness Pt is an 86 yo female s/p L leg mechanical thrombectomy. Workup also noted UTI. PMH of dementia, anxiety, HTN, COPD, chronic back pain.   Clinical Impression   [name] was seen for OT evaluation this date. Prior to hospital admission, pt was Independent in mobility and ADLs, using shower chair for bathing. Pt lives c son and his fiancee, available 24/7. Pt presents to acute OT demonstrating impaired ADL performance and functional mobility 2/2 decreased safety awareness and functional endurance deficits. Upon arrival pt completing mobility c PT. Pt currently requires MOD I for LB access seated EOC, anticipate CGA for standing portion. CGA + RW for ADL t/f. SETUP self-feeding reclined in chair. Pt would benefit from skilled OT to address noted impairments and functional limitations (see below for any additional details) in order to maximize safety and independence while minimizing falls risk and caregiver burden. Upon hospital discharge, anticipate no OT follow up needed.     Follow Up Recommendations  No OT follow up;Supervision - Intermittent    Equipment Recommendations  None recommended by OT    Recommendations for Other Services       Precautions / Restrictions Precautions Precautions: Fall Restrictions Weight Bearing Restrictions: No      Mobility Bed Mobility Overal bed mobility: Modified Independent             General bed mobility comments: received standing c PT, left up in chair    Transfers Overall transfer level: Needs assistance Equipment used: Rolling walker (2 wheeled) Transfers: Sit to/from Stand Sit to Stand: Supervision              Balance Overall balance assessment: Modified Independent                                         ADL either performed or assessed with  clinical judgement   ADL Overall ADL's : Needs assistance/impaired                                       General ADL Comments: MOD I for LB access seated EOC, anticipate CGA for standing portion. CGA + RW for ADL t/f. SETUP self-feeding reclined in chair                  Pertinent Vitals/Pain Pain Assessment: No/denies pain Faces Pain Scale: No hurt     Hand Dominance Right   Extremity/Trunk Assessment Upper Extremity Assessment Upper Extremity Assessment: Generalized weakness   Lower Extremity Assessment Lower Extremity Assessment: Overall WFL for tasks assessed (official MMT deferred but pt able to lift against gravity with excellent control)   Cervical / Trunk Assessment Cervical / Trunk Assessment: Kyphotic   Communication Communication Communication: No difficulties   Cognition Arousal/Alertness: Awake/alert Behavior During Therapy: Impulsive Overall Cognitive Status: Within Functional Limits for tasks assessed                                 General Comments: oriented to self, place   General Comments       Exercises Exercises: Other exercises Other Exercises Other Exercises: Pt educated re: OT role,  DME recs, d/c recs, falls prevention Other Exercises: LBD, sit<>stand, sitting/standing balance/tolerance, self-feeding/drinking   Shoulder Instructions      Home Living Family/patient expects to be discharged to:: Private residence Living Arrangements: Children (son + his fiancee) Available Help at Discharge: Family;Available 24 hours/day Type of Home: House Home Access: Stairs to enter [employer] of Steps: either no steps to enter, or 10 with bilateral rails depending on which house she plans on going to at discharge   Home Layout: One level     Bathroom Shower/Tub: Walk-in shower;Tub/shower unit         Home Equipment: Shower seat;Hand held shower head   Additional Comments: pt uses shower seat in walk  in shower      Prior Functioning/Environment Level of Independence: Independent        Comments: family assists with homemaking duties        OT Problem List: Decreased activity tolerance;Decreased safety awareness      OT Treatment/Interventions: Self-care/ADL training;Therapeutic exercise;Energy conservation;DME and/or AE instruction;Therapeutic activities;Balance training;Patient/family education    OT Goals(Current goals can be found in the care plan section) Acute Rehab OT Goals Patient Stated Goal: to go home OT Goal Formulation: With patient Time For Goal Achievement: 01/28/20 Potential to Achieve Goals: Good ADL Goals Pt Will Perform Grooming: Independently;standing Pt Will Perform Lower Body Dressing: Independently;sit to/from stand Pt Will Transfer to Toilet: with modified independence;ambulating;regular height toilet (c LRAD PRN)  OT Frequency: Min 1X/week    AM-PAC OT "6 Clicks" Daily Activity     Outcome Measure Help from another person eating meals?: None Help from another person taking care of personal grooming?: None Help from another person toileting, which includes using toliet, bedpan, or urinal?: A Little Help from another person bathing (including washing, rinsing, drying)?: A Little Help from another person to put on and taking off regular upper body clothing?: None Help from another person to put on and taking off regular lower body clothing?: A Little 6 Click Score: 21   End of Session Equipment Utilized During Treatment: Rolling walker  Activity Tolerance: Patient tolerated treatment well Patient left: in chair;with call bell/phone within reach;with chair alarm set  OT Visit Diagnosis: Other abnormalities of gait and mobility (R26.89)                Time: 2694-8546 OT Time Calculation (min): 7 min Charges:  OT General Charges $OT Visit: 1 Visit OT Evaluation $OT Eval Low Complexity: 1 Low  Kathie Dike, M.S. OTR/L  01/14/20, 9:56 AM   ascom [phone]

## 2020-01-14 NOTE — Progress Notes (Signed)
PROGRESS NOTE    [name]  CVE:[id number] DOB: 26-Apr-1932 DOA: 01/12/2020 PCP: [facility]   Chief Complain: Left leg swelling/pain  Brief Narrative: Patient is 86 year old female with history of dementia, anxiety, hypertension, COPD who presented to the emergency department complaints of left lower extremity swelling and pain. There was report of fall about 2 days ago before admission. She was found to have DVT of left lower extremity with significant clot burden. She was started on heparin drip. Vascular surgery did thrombectomy. Anticoagulation has been transitioned to Eliquis. Plan for discharge tomorrow to home with home health.  Assessment & Plan:   Principal Problem:   Acute deep vein thrombosis (DVT) of iliac vein of left lower extremity (HCC) Active Problems:   Hypertension   Anxiety and depression   Dementia (HCC)   Mild renal insufficiency   Deep vein thrombosis (DVT) of popliteal vein (HCC)   Acute left lower extremity DVT: Presented with left lower extremity pain and swelling. Found to have DVT with extensive clot burden. Vascular surgery did thrombectomy. Heparin transitioned to Eliquis.Needs Eliquis RX on DC,will alert TOC for coupons  Urinary tract infection: Complains of dysuria. There was suprapubic tenderness on presentation. UA was consistent with UTI. Started on ceftriaxone. Urine culture showing gram-negative rods. Continue current antibiotics, follow-up cultures and transition to oral antibiotics  Dysuria/pelvic discomfort : Could be from urinary tract infection or  Could be from dry vagina from her old age. Ordered Pyridium, vaginal estrogen cream.  AKI: Resolved with IV fluids  Hypertension: Blood pressure currently stable. Takes losartan, Toprol at home.  Normocytic anemia: Currently hemoglobin stable in the range of 8.Monitor as outpatient  Anxiety/dementia: Continue supportive care. Continue Namenda,  Klonopin  Debility/deconditioning: Lives with her daughter. Uses cane for ambulation. PT recommended home health.         DVT prophylaxis:Eliquis Code Status: Full Family Communication: Son on phone today Status is: Inpatient  Remains inpatient appropriate because:Inpatient level of care appropriate due to severity of illness   Dispo: The patient is from: Home              Anticipated d/c is to: Home              Anticipated d/c date is: 1 day              Patient currently is not medically stable to d/c.    Consultants: Vascular surgery  Procedures: None  Antimicrobials:  Anti-infectives (From admission, onward)   Start     Dose/Rate Route Frequency Ordered Stop   01/13/20 1945  cefTRIAXone (ROCEPHIN) 1 g in sodium chloride 0.9 % 100 mL IVPB        1 g 200 mL/hr over 30 Minutes Intravenous Every 24 hours 01/12/20 2151     01/13/20 1132  ceFAZolin (ANCEF) IVPB 2g/100 mL premix        2 g 200 mL/hr over 30 Minutes Intravenous 30 min pre-op 01/13/20 1132 01/13/20 1855   01/12/20 1945  cefTRIAXone (ROCEPHIN) 1 g in sodium chloride 0.9 % 100 mL IVPB        1 g 200 mL/hr over 30 Minutes Intravenous  Once 01/12/20 1930 01/12/20 2100      Subjective: Patient seen and examined the bedside this morning.  Hemodynamically stable.  Overall  comfortable but complains of vaginal discomfort/dysuria.  Left lower extremity swelling has significantly improved  Objective: Vitals:   01/13/20 1948 01/14/20 0416 01/14/20 0824 01/14/20 1117  BP: 105/68  124/74 131/63 112/61  Pulse: 66 68 73 76  Resp: 20 20 19 16   Temp: 97.8 F (36.6 C) 98.4 F (36.9 C) 99 F (37.2 C) 98.3 F (36.8 C)  TempSrc: Oral Oral Oral   SpO2: 98% 97% 99% 100%  Weight:      Height:        Intake/Output Summary (Last 24 hours) at 01/14/2020 1431 Last data filed at 01/14/2020 1300 Gross per 24 hour  Intake 1073.09 ml  Output 625 ml  Net 448.09 ml   Filed Weights   01/12/20 1621 01/13/20 1541   Weight: 63.5 kg 63.5 kg    Examination:  General exam: Appears calm and comfortable ,Not in distress, pleasant elderly female HEENT:PERRL,Oral mucosa moist, Ear/Nose normal on gross exam Respiratory system: Bilateral equal air entry, normal vesicular breath sounds, no wheezes or crackles  Cardiovascular system: S1 & S2 heard, RRR. No JVD, murmurs, rubs, gallops or clicks. No pedal edema. Gastrointestinal system: Abdomen is nondistended, soft and nontender. No organomegaly or masses felt. Normal bowel sounds heard. Central nervous system: Alert and awake. No focal neurological deficits. Extremities: LLE edema, no clubbing ,no cyanosis Skin: No rashes, lesions or ulcers,no icterus ,no pallor   Data Reviewed: I have personally reviewed following labs and imaging studies  CBC: Recent Labs  Lab 01/12/20 1626 01/13/20 0544 01/14/20 0534  WBC 8.3 8.8 7.4  HGB 9.8* 9.6* 8.7*  HCT 29.9* 28.7* 26.2*  MCV 92.0 91.4 90.7  PLT 306 256 233   Basic Metabolic Panel: Recent Labs  Lab 01/12/20 1626 01/13/20 0544 01/14/20 0534  NA 137 138 139  K 4.0 4.1 4.4  CL 106 108 109  CO2 24 23 23   GLUCOSE 140* 105* 144*  BUN 41* 36* 31*  CREATININE 1.15* 0.82 0.82  CALCIUM 8.6* 8.1* 8.0*   GFR: Estimated Creatinine Clearance: 45.1 mL/min (by C-G formula based on SCr of 0.82 mg/dL). Liver Function Tests: No results for input(s): AST, ALT, ALKPHOS, BILITOT, PROT, ALBUMIN in the last 168 hours. No results for input(s): LIPASE, AMYLASE in the last 168 hours. No results for input(s): AMMONIA in the last 168 hours. Coagulation Profile: Recent Labs  Lab 01/12/20 2224  INR 1.2   Cardiac Enzymes: No results for input(s): CKTOTAL, CKMB, CKMBINDEX, TROPONINI in the last 168 hours. BNP (last 3 results) No results for input(s): PROBNP in the last 8760 hours. HbA1C: No results for input(s): HGBA1C in the last 72 hours. CBG: No results for input(s): GLUCAP in the last 168 hours. Lipid  Profile: No results for input(s): CHOL, HDL, LDLCALC, TRIG, CHOLHDL, LDLDIRECT in the last 72 hours. Thyroid Function Tests: No results for input(s): TSH, T4TOTAL, FREET4, T3FREE, THYROIDAB in the last 72 hours. Anemia Panel: No results for input(s): VITAMINB12, FOLATE, FERRITIN, TIBC, IRON, RETICCTPCT in the last 72 hours. Sepsis Labs: No results for input(s): PROCALCITON, LATICACIDVEN in the last 168 hours.  Recent Results (from the past 240 hour(s))  Resp Panel by RT-PCR (Flu A&B, Covid) Nasopharyngeal Swab     Status: None   Collection Time: 01/12/20  9:20 PM   Specimen: Nasopharyngeal Swab; Nasopharyngeal(NP) swabs in vial transport medium  Result Value Ref Range Status   SARS Coronavirus 2 by RT PCR NEGATIVE NEGATIVE Final    Comment: (NOTE) SARS-CoV-2 target nucleic acids are NOT DETECTED.  The SARS-CoV-2 RNA is generally detectable in upper respiratory specimens during the acute phase of infection. The lowest concentration of SARS-CoV-2 viral copies this assay can detect is 138 copies/mL.  A negative result does not preclude SARS-Cov-2 infection and should not be used as the sole basis for treatment or other patient management decisions. A negative result may occur with  improper specimen collection/handling, submission of specimen other than nasopharyngeal swab, presence of viral mutation(s) within the areas targeted by this assay, and inadequate number of viral copies(<138 copies/mL). A negative result must be combined with clinical observations, patient history, and epidemiological information. The expected result is Negative.  Fact Sheet for Patients:  BloggerCourse.com  Fact Sheet for Healthcare Providers:  SeriousBroker.it  This test is no t yet approved or cleared by the [place] FDA and  has been authorized for detection and/or diagnosis of SARS-CoV-2 by FDA under an Emergency Use Authorization (EUA). This EUA  will remain  in effect (meaning this test can be used) for the duration of the COVID-19 declaration under Section 564(b)(1) of the Act, 21 U.S.C.section 360bbb-3(b)(1), unless the authorization is terminated  or revoked sooner.       Influenza A by PCR NEGATIVE NEGATIVE Final   Influenza B by PCR NEGATIVE NEGATIVE Final    Comment: (NOTE) The Xpert Xpress SARS-CoV-2/FLU/RSV plus assay is intended as an aid in the diagnosis of influenza from Nasopharyngeal swab specimens and should not be used as a sole basis for treatment. Nasal washings and aspirates are unacceptable for Xpert Xpress SARS-CoV-2/FLU/RSV testing.  Fact Sheet for Patients: BloggerCourse.com  Fact Sheet for Healthcare Providers: SeriousBroker.it  This test is not yet approved or cleared by the [place] FDA and has been authorized for detection and/or diagnosis of SARS-CoV-2 by FDA under an Emergency Use Authorization (EUA). This EUA will remain in effect (meaning this test can be used) for the duration of the COVID-19 declaration under Section 564(b)(1) of the Act, 21 U.S.C. section 360bbb-3(b)(1), unless the authorization is terminated or revoked.  Performed at [facility], [address] [place], [place] [address]   Urine culture     Status: Abnormal (Preliminary result)   Collection Time: 01/12/20 10:24 PM   Specimen: Urine, Random  Result Value Ref Range Status   Specimen Description   Final    URINE, RANDOM Performed at [facility], [address], [place], [place] [address]    Special Requests   Final    NONE Performed at [facility], [address] [place], [place] [address]    Culture (A)  Final    >=100,000 COLONIES/mL ESCHERICHIA COLI SUSCEPTIBILITIES TO FOLLOW Performed at [facility], [address], [place], [place] [address]    Report Status PENDING  Incomplete         Radiology  Studies: CT HEAD WO CONTRAST  Result Date: 01/12/2020 CLINICAL DATA:  Larey Seat 2 days ago, loss of consciousness, hit head, dimension EXAM: CT HEAD WITHOUT CONTRAST TECHNIQUE: Contiguous axial images were obtained from the base of the skull through the vertex without intravenous contrast. COMPARISON:  10/26/2018 FINDINGS: Brain: No acute infarct or hemorrhage. Hypodensities in the bilateral basal ganglia consistent with chronic lacunar infarcts, stable. Lateral ventricles and remaining midline structures are unremarkable. No acute extra-axial fluid collections. No mass effect. Vascular: Stable atherosclerosis of the internal carotid arteries. No hyperdense vessel. Skull: Normal. Negative for fracture or focal lesion. Sinuses/Orbits: No acute finding. Other: None. IMPRESSION: 1. Stable exam, no acute intracranial process. Electronically Signed   By: Sharlet Salina M.D.   On: 01/12/2020 17:14   PERIPHERAL VASCULAR CATHETERIZATION  Result Date: 01/13/2020 See op note  US Venous  Img Lower Unilateral Left  Result Date: 01/12/2020 CLINICAL DATA:  Initial evaluation for acute swelling. EXAM: LEFT LOWER EXTREMITY VENOUS DOPPLER ULTRASOUND TECHNIQUE: Gray-scale sonography with graded compression, as well as color Doppler and duplex ultrasound were performed to evaluate the lower extremity deep venous systems from the level of the common femoral vein and including the common femoral, femoral, profunda femoral, popliteal and calf veins including the posterior tibial, peroneal and gastrocnemius veins when visible. The superficial great saphenous vein was also interrogated. Spectral Doppler was utilized to evaluate flow at rest and with distal augmentation maneuvers in the common femoral, femoral and popliteal veins. COMPARISON:  Prior ultrasound from 10/18/2005. FINDINGS: Contralateral Common Femoral Vein: Respiratory phasicity is normal and symmetric with the symptomatic side. No evidence of thrombus. Normal  compressibility. Common Femoral Vein: Occlusive echogenic thrombus seen throughout the left common femoral vein. Saphenofemoral Junction: Occlusive thrombus throughout the left saphenofemoral junction. Profunda Femoral Vein: Occlusive echogenic thrombus throughout the deep left femoral vein. Femoral Vein: Occlusive echogenic thrombus throughout the left femoral vein. Popliteal Vein: Occlusive echogenic thrombus throughout the left popliteal vein. Calf Veins: Occlusive echogenic thrombus throughout the visualized veins of the calf. Superficial Great Saphenous Vein: Visualized left great saphenous vein appears patent. Other Findings: Echogenic occlusive thrombus extends into the visualized left external iliac vein. The left common iliac vein is not visualized on this exam. Visualized proximal-mid IVC appears to be grossly patent. IMPRESSION: Extensive acute occlusive DVT extending from the left external iliac vein through the veins of the left calf. Electronically Signed   By: Rise Mu M.D.   On: 01/12/2020 20:38        Scheduled Meds: . apixaban  10 mg Oral BID   Followed by  . [START ON 01/21/2020] apixaban  5 mg Oral BID  . clonazePAM  1 mg Oral BID  . conjugated estrogens  1 Applicatorful Vaginal Daily  . memantine  10 mg Oral Daily  . metoprolol succinate  100 mg Oral Daily  . phenazopyridine  100 mg Oral TID WC   Continuous Infusions: . cefTRIAXone (ROCEPHIN)  IV 1 g (01/13/20 2015)     LOS: 2 days    Time spent:25 mins. More than 50% of that time was spent in counseling and/or coordination of care.      Burnadette Pop, MD [facility] P12/21/2021, 2:31 PM

## 2020-01-14 NOTE — Progress Notes (Signed)
ANTICOAGULATION CONSULT NOTE  Pharmacy Consult for apixaban Indication: VTE Treatment  Allergies  Allergen Reactions  . Escitalopram Diarrhea  . Meloxicam Other (See Comments)    insomnia   Patient Measurements: Height: 5\' 5"  (165.1 cm) Weight: 63.5 kg (139 lb 15.9 oz) IBW/kg (Calculated) : 57 Heparin Dosing Weight: 63.5 kg  Vital Signs: Temp: 98.4 F (36.9 C) (12/21 0416) Temp Source: Oral (12/21 0416) BP: 124/74 (12/21 0416) Pulse Rate: 68 (12/21 0416)  Labs: Recent Labs    01/12/20 1626 01/12/20 2224 01/13/20 0544 01/14/20 0534  HGB 9.8*  --  9.6* 8.7*  HCT 29.9*  --  28.7* 26.2*  PLT 306  --  256 233  APTT  --  135*  --   --   LABPROT  --  14.6  --   --   INR  --  1.2  --   --   HEPARINUNFRC  --   --  0.29* 0.15*  CREATININE 1.15*  --  0.82 0.82    Estimated Creatinine Clearance: 45.1 mL/min (by C-G formula based on SCr of 0.82 mg/dL).  Medical History: Past Medical History:  Diagnosis Date  . Anxiety   . Anxiety and depression   . Arthritis    "hands, back" (05/04/2017)  . Chronic back pain   . Colitis 05/03/2017  . Depression   . Diverticulosis of colon (without mention of hemorrhage)   . Hiatal hernia   . History of blood transfusion 07/1974   "related to childbirth"  . Hyperlipidemia   . Hypertension   . Non compliance w medication regimen   . Other and unspecified noninfectious gastroenteritis and colitis(558.9)   . Pneumonia    "several times; same time q yr in [place]" (05/04/2017)    Assesment:  86 yo female with  extensive left lower extremity DVT now s/p thrombectomy  Pharmacy was consulted to transition from heparin to apixaban for VTE treatment. H&H trending down since admission  Goal of Therapy:  Monitor platelets by anticoagulation protocol: Yes    Plan:   Stop heparin infusion  One hour after heparin has been stopped start apixaban 10 mg twice daily for 7 days followed by 5 mg twice daily  CBC at least every 3 days per  protocol  83, PharmD Clinical Pharmacist 01/14/2020 7:13 AM

## 2020-01-15 DIAGNOSIS — I82422 Acute embolism and thrombosis of left iliac vein: Secondary | ICD-10-CM | POA: Diagnosis not present

## 2020-01-15 DIAGNOSIS — I82439 Acute embolism and thrombosis of unspecified popliteal vein: Secondary | ICD-10-CM

## 2020-01-15 DIAGNOSIS — I824Y2 Acute embolism and thrombosis of unspecified deep veins of left proximal lower extremity: Secondary | ICD-10-CM | POA: Diagnosis not present

## 2020-01-15 DIAGNOSIS — N390 Urinary tract infection, site not specified: Secondary | ICD-10-CM | POA: Diagnosis not present

## 2020-01-15 DIAGNOSIS — I82431 Acute embolism and thrombosis of right popliteal vein: Secondary | ICD-10-CM

## 2020-01-15 LAB — CBC
HCT: 26.4 % — ABNORMAL LOW (ref 36.0–46.0)
Hemoglobin: 8.4 g/dL — ABNORMAL LOW (ref 12.0–15.0)
MCH: 29.6 pg (ref 26.0–34.0)
MCHC: 31.8 g/dL (ref 30.0–36.0)
MCV: 93 fL (ref 80.0–100.0)
Platelets: 249 10*3/uL (ref 150–400)
RBC: 2.84 MIL/uL — ABNORMAL LOW (ref 3.87–5.11)
RDW: 14.6 % (ref 11.5–15.5)
WBC: 7.1 10*3/uL (ref 4.0–10.5)
nRBC: 0 % (ref 0.0–0.2)

## 2020-01-15 LAB — URINE CULTURE: Culture: >=100000 — AB

## 2020-01-15 MED ORDER — CEPHALEXIN 250 MG PO CAPS: 250.0000 mg | ORAL_CAPSULE | Freq: Two times a day (BID) | ORAL | 0 refills | Status: DC

## 2020-01-15 MED ORDER — CEPHALEXIN 250 MG PO CAPS: 250.0000 mg | ORAL_CAPSULE | Freq: Two times a day (BID) | ORAL | 0 refills | Status: AC

## 2020-01-15 MED ORDER — APIXABAN 5 MG PO TABS
10.0000 mg | ORAL_TABLET | Freq: Two times a day (BID) | ORAL | 0 refills | Status: DC
Start: 2020-01-15 — End: 2021-05-27

## 2020-01-15 MED ORDER — APIXABAN 5 MG PO TABS: 5.0000 mg | ORAL_TABLET | Freq: Two times a day (BID) | ORAL | 0 refills | Status: DC

## 2020-01-15 MED ORDER — APIXABAN 5 MG PO TABS
10.0000 mg | ORAL_TABLET | Freq: Two times a day (BID) | ORAL | 0 refills | Status: DC
Start: 2020-01-15 — End: 2020-01-15

## 2020-01-15 NOTE — Care Management Important Message (Signed)
Important Message  Patient Details  Name: [name] MRN: [id number] Date of Birth: 09-21-1932   Medicare Important Message Given:  Yes     Olegario Messier A Marque Bango 01/15/2020, 10:43 AM

## 2020-01-15 NOTE — Progress Notes (Signed)
[name] Hint to be D/C'd home per MD order.  Discussed prescriptions and follow up appointments with the patient. Prescriptions given to patient, medication list explained in detail. Pt verbalized understanding.  Allergies as of 01/15/2020       Reactions   Escitalopram Diarrhea   Meloxicam Other (See Comments)   insomnia        Medication List     STOP taking these medications    albuterol (2.5 MG/3ML) 0.083% nebulizer solution Commonly known as: PROVENTIL   fluticasone 50 MCG/ACT nasal spray Commonly known as: FLONASE   loratadine 10 MG tablet Commonly known as: CLARITIN   meloxicam 7.5 MG tablet Commonly known as: MOBIC       TAKE these medications    acetaminophen 650 MG CR tablet Commonly known as: TYLENOL Take 1 tablet (650 mg total) by mouth every 8 (eight) hours as needed for pain.   apixaban 5 MG Tabs tablet Commonly known as: ELIQUIS Take 2 tablets (10 mg total) by mouth 2 (two) times daily for 7 days. 10 mg po bid for 7 days followed by 5 mg po bid   apixaban 5 MG Tabs tablet Commonly known as: ELIQUIS Take 1 tablet (5 mg total) by mouth 2 (two) times daily. 10 mg po bid for 7 days followed by 5 mg po bid Start taking on: January 21, 2020   cephALEXin 250 MG capsule Commonly known as: KEFLEX Take 1 capsule (250 mg total) by mouth 2 (two) times daily for 1 day.   clonazePAM 1 MG tablet Commonly known as: KLONOPIN Take 1 tablet by mouth 2 (two) times daily.   HYDROcodone-acetaminophen 5-325 MG tablet Commonly known as: NORCO/VICODIN Take 1 tablet by mouth 3 (three) times daily as needed (pain).   losartan 50 MG tablet Commonly known as: COZAAR Take 50 mg by mouth daily.   memantine 10 MG tablet Commonly known as: NAMENDA Take 10 mg by mouth daily.   metoprolol succinate 100 MG 24 hr tablet Commonly known as: TOPROL-XL Take 100 mg by mouth daily.   OVER THE COUNTER MEDICATION Take 1 tablet by mouth at bedtime. [facility] over the counter  sleeping pill   OVER THE COUNTER MEDICATION Take 15 mLs by mouth at bedtime. [facility] liquid cough syrup - night time               Durable Medical Equipment  (From admission, onward)           Start     Ordered   01/15/20 1209  For home use only DME Walker rolling  Once       Question Answer Comment  Walker: With 5 Inch Wheels   Patient needs a walker to treat with the following condition Ambulatory dysfunction      01/15/20 1208            Vitals:   01/15/20 0817 01/15/20 1128  BP: 127/67 135/79  Pulse: 66 75  Resp: 18 16  Temp: 98.1 F (36.7 C) 97.9 F (36.6 C)  SpO2: 99% 100%    Skin clean, dry and intact without evidence of skin break down, no evidence of skin tears noted.  Coban wrap was removed per Dr. Margaretmary Eddy orders. IV catheter discontinued intact. Site without signs and symptoms of complications. Dressing and pressure applied. Pt denies pain at this time. No complaints noted.  An After Visit Summary was printed and given to the patient. Patient escorted via WC, and D/C home via private auto.  Byers A Blaire Palomino

## 2020-01-15 NOTE — TOC Transition Note (Addendum)
Transition of Care [facility]) - CM/SW Discharge Note   Patient Details  Name: [name] MRN: [id number] Date of Birth: December 30, 1932  [facility] CM/SW Contact:  Margarito Liner, LCSW Phone Number: 01/15/2020, 12:14 PM   Clinical Narrative: Patient has orders to discharge home today. Patient and her son's fiance agreeable to home health. No agency preference. Made referral to [facility] for PT, OT, RN, aide, SW. Ordered rolling walker through [employer] Pharmacist will provide Eliquis card. No further concerns. CSW signing off.    12:34 pm: [facility] not [facility] staffing for [facility]. Referral made to and accepted by [facility]. Patient and son's fiance aware and agreeable. Signing off.  Final next level of care: Home w Home Health Services Barriers to Discharge: Barriers Resolved   Patient Goals and CMS Choice   CMS Medicare.gov Compare Post Acute Care list provided to:: Patient (Son's fiance at bedside) Choice offered to / list presented to : Patient (Son's fiance)  Discharge Placement                  Name of family member notified: Wallace Cullens Patient and family notified of of transfer: 01/15/20  Discharge Plan and Services                DME Arranged: Dan Humphreys rolling DME Agency: [employer] Date DME Agency Contacted: 01/15/20   Representative spoke with at DME Agency: [facility] Arranged: RN,PT,OT,Nurse's Aide,Social Work [employer] Agency: [employer] ([facility]) Date HH Agency Contacted: 01/15/20   Representative spoke with at [facility] Agency: Feliberto Gottron  Social Determinants of Health (SDOH) Interventions     Readmission Risk Interventions No flowsheet data found.

## 2020-01-15 NOTE — Discharge Instructions (Signed)

## 2020-01-18 NOTE — Discharge Summary (Signed)
5        [place] [facility]   PATIENT NAME: [name]    MR#:  [id number]  DATE OF BIRTH:  12-Aug-1932  DATE OF ADMISSION:  01/12/2020   ADMITTING PHYSICIAN: Briscoe Deutscher, MD  DATE OF DISCHARGE: 01/15/2020  2:44 PM  PRIMARY CARE PHYSICIAN: Pa, [facility]   ADMISSION DIAGNOSIS:  Lower urinary tract infection [N39.0] Acute deep vein thrombosis (DVT) of proximal vein of left lower extremity (HCC) [I82.4Y2] Acute deep vein thrombosis (DVT) of iliac vein of left lower extremity (HCC) [I82.422] Deep vein thrombosis (DVT) of popliteal vein, unspecified chronicity, unspecified laterality (HCC) [I82.439] DISCHARGE DIAGNOSIS:  Principal Problem:   Acute deep vein thrombosis (DVT) of proximal vein of left lower extremity (HCC) Active Problems:   Hypertension   Anxiety and depression   Dementia (HCC)   Mild renal insufficiency   Deep vein thrombosis (DVT) of popliteal vein (HCC)  SECONDARY DIAGNOSIS:   Past Medical History:  Diagnosis Date  . Anxiety   . Anxiety and depression   . Arthritis    "hands, back" (05/04/2017)  . Chronic back pain   . Colitis 05/03/2017  . Depression   . Diverticulosis of colon (without mention of hemorrhage)   . Hiatal hernia   . History of blood transfusion 07/1974   "related to childbirth"  . Hyperlipidemia   . Hypertension   . Non compliance w medication regimen   . Other and unspecified noninfectious gastroenteritis and colitis(558.9)   . Pneumonia    "several times; same time q yr in [place]" (05/04/2017)   HOSPITAL COURSE:  Patient is 86 year old female with history of dementia, anxiety, hypertension, COPD admitted for left lower extremity swelling and pain. There was report of fall about 2 days ago before admission.   Acute left lower extremity DVT: Presented with left lower extremity pain and swelling. Found to have DVT with extensive clot burden. Vascular surgery did thrombectomy. Heparin transitioned to Eliquis  which she tolerated and was discharged on oral Eliquis.  E. coli urinary tract infection: Complains of dysuria. There was suprapubic tenderness on presentation. UA was consistent with UTI. Started on ceftriaxone. Urine culture showing E. coli.   Treated with antibiotics with good response  Dysuria/pelvic discomfort : Due to urinary tract infection or Could be from dry vagina from her old age. Pyridium, vaginal estrogen cream was also helpful.  AKI: Resolved with IV fluids  Hypertension: Blood pressure controlled  Normocytic anemia: Currently hemoglobin stable in the range of 8.Monitor as outpatient  Anxiety/dementia: Continue supportive care. Continue Namenda, Klonopin  Debility/deconditioning: Lives with her daughter. Uses cane for ambulation. PT recommended home health.    DISCHARGE CONDITIONS:  Stable CONSULTS OBTAINED:  Treatment Team:  Annice Needy, MD DRUG ALLERGIES:   Allergies  Allergen Reactions  . Escitalopram Diarrhea  . Meloxicam Other (See Comments)    insomnia   DISCHARGE MEDICATIONS:   Allergies as of 01/15/2020      Reactions   Escitalopram Diarrhea   Meloxicam Other (See Comments)   insomnia      Medication List    STOP taking these medications   albuterol (2.5 MG/3ML) 0.083% nebulizer solution Commonly known as: PROVENTIL   fluticasone 50 MCG/ACT nasal spray Commonly known as: FLONASE   loratadine 10 MG tablet Commonly known as: CLARITIN   meloxicam 7.5 MG tablet Commonly known as: MOBIC     TAKE these medications   acetaminophen 650 MG CR tablet Commonly known as: TYLENOL  Take 1 tablet (650 mg total) by mouth every 8 (eight) hours as needed for pain.   apixaban 5 MG Tabs tablet Commonly known as: ELIQUIS Take 2 tablets (10 mg total) by mouth 2 (two) times daily for 7 days. 10 mg po bid for 7 days followed by 5 mg po bid   apixaban 5 MG Tabs tablet Commonly known as: ELIQUIS Take 1 tablet (5 mg total) by mouth 2 (two) times  daily. 10 mg po bid for 7 days followed by 5 mg po bid Start taking on: January 21, 2020   clonazePAM 1 MG tablet Commonly known as: KLONOPIN Take 1 tablet by mouth 2 (two) times daily.   HYDROcodone-acetaminophen 5-325 MG tablet Commonly known as: NORCO/VICODIN Take 1 tablet by mouth 3 (three) times daily as needed (pain).   losartan 50 MG tablet Commonly known as: COZAAR Take 50 mg by mouth daily.   memantine 10 MG tablet Commonly known as: NAMENDA Take 10 mg by mouth daily.   metoprolol succinate 100 MG 24 hr tablet Commonly known as: TOPROL-XL Take 100 mg by mouth daily.   OVER THE COUNTER MEDICATION Take 1 tablet by mouth at bedtime. [facility] over the counter sleeping pill   OVER THE COUNTER MEDICATION Take 15 mLs by mouth at bedtime. [facility] liquid cough syrup - night time     ASK your doctor about these medications   cephALEXin 250 MG capsule Commonly known as: KEFLEX Take 1 capsule (250 mg total) by mouth 2 (two) times daily for 1 day. Ask about: Should I take this medication?      DISCHARGE INSTRUCTIONS:   DIET:  Cardiac diet DISCHARGE CONDITION:  Stable ACTIVITY:  Activity as tolerated OXYGEN:  Home Oxygen: No.  Oxygen Delivery: room air DISCHARGE LOCATION:  home with home health PT, OT -TOC patient refused home health services  If you experience worsening of your admission symptoms, develop shortness of breath, life threatening emergency, suicidal or homicidal thoughts you must seek medical attention immediately by calling 911 or calling your MD immediately  if symptoms less severe.  You Must read complete instructions/literature along with all the possible adverse reactions/side effects for all the Medicines you take and that have been prescribed to you. Take any new Medicines after you have completely understood and accpet all the possible adverse reactions/side effects.   Please note  You were cared for by a hospitalist during your hospital stay. If you  have any questions about your discharge medications or the care you received while you were in the hospital after you are discharged, you can call the unit and asked to speak with the hospitalist on call if the hospitalist that took care of you is not available. Once you are discharged, your primary care physician will handle any further medical issues. Please note that NO REFILLS for any discharge medications will be authorized once you are discharged, as it is imperative that you return to your primary care physician (or establish a relationship with a primary care physician if you do not have one) for your aftercare needs so that they can reassess your need for medications and monitor your lab values.    On the day of Discharge:  VITAL SIGNS:  Blood pressure 135/79, pulse 75, temperature 97.9 F (36.6 C), temperature source Oral, resp. rate 16, height 5\' 5"  (1.651 m), weight 63.5 kg, SpO2 100 %. PHYSICAL EXAMINATION:  GENERAL:  86 y.o.-year-old patient lying in the bed with no acute distress.  EYES: Pupils  equal, round, reactive to light and accommodation. No scleral icterus. Extraocular muscles intact.  HEENT: Head atraumatic, normocephalic. Oropharynx and nasopharynx clear.  NECK:  Supple, no jugular venous distention. No thyroid enlargement, no tenderness.  LUNGS: Normal breath sounds bilaterally, no wheezing, rales,rhonchi or crepitation. No use of accessory muscles of respiration.  CARDIOVASCULAR: S1, S2 normal. No murmurs, rubs, or gallops.  ABDOMEN: Soft, non-tender, non-distended. Bowel sounds present. No organomegaly or mass.  EXTREMITIES: No pedal edema, cyanosis, or clubbing.  NEUROLOGIC: Cranial nerves II through XII are intact. Muscle strength 5/5 in all extremities. Sensation intact. Gait not checked.  PSYCHIATRIC: The patient is alert and oriented x 3.  SKIN: No obvious rash, lesion, or ulcer.  DATA REVIEW:   CBC Recent Labs  Lab 01/15/20 0454  WBC 7.1  HGB 8.4*  HCT  26.4*  PLT 249    Chemistries  Recent Labs  Lab 01/14/20 0534  NA 139  K 4.4  CL 109  CO2 23  GLUCOSE 144*  BUN 31*  CREATININE 0.82  CALCIUM 8.0*     Outpatient follow-up  Follow-up Information    Georgiana Spinner, NP. Go on 02/05/2020.   Specialty: Vascular Surgery Why: 1pm appoitment Contact information: [address] [place] [address] [phone]        Pa, [facility]. Schedule an appointment as soon as possible for a visit in 1 week.   Why: phone is ringing busy Contact information: [address] [place] [address] [phone]        [facility].   Specialty: Home Health Services Why: They will follow up with you for your home health needs. Contact information: [address] [place] [address] [phone]                Management plans discussed with the patient, family and they are in agreement.  CODE STATUS: Prior   TOTAL TIME TAKING CARE OF THIS PATIENT: 45 minutes.    Delfino Lovett M.D on 01/18/2020 at 1:26 PM  [facility]   CC: Primary care physician; Pa, [facility]   Note: This dictation was prepared with Dragon dictation along with smaller phrase technology. Any transcriptional errors that result from this process are unintentional.

## 2020-02-04 ENCOUNTER — Other Ambulatory Visit (INDEPENDENT_AMBULATORY_CARE_PROVIDER_SITE_OTHER): Payer: Self-pay | Admitting: Vascular Surgery

## 2020-02-04 DIAGNOSIS — I82412 Acute embolism and thrombosis of left femoral vein: Secondary | ICD-10-CM

## 2020-02-05 ENCOUNTER — Ambulatory Visit (INDEPENDENT_AMBULATORY_CARE_PROVIDER_SITE_OTHER): Payer: Self-pay | Admitting: Nurse Practitioner

## 2020-02-05 ENCOUNTER — Encounter (INDEPENDENT_AMBULATORY_CARE_PROVIDER_SITE_OTHER): Payer: Self-pay

## 2020-02-12 ENCOUNTER — Telehealth: Payer: Self-pay | Admitting: Hospice

## 2020-02-12 NOTE — Telephone Encounter (Signed)
Spoke with the patient regarding the Palliative referral/services and to see if she wanted to schedule visit.   She said that she already has a nurse that comes to see her from [facility] and doesn't feel like she needs [place] to come out at this time.  I explained to her that if she changes her mind and needs [place] to come see her to let Dr. Clarene Duke know so that he came send [place] another referral and she was in agreement with this.

## 2021-05-26 ENCOUNTER — Encounter: Payer: Self-pay | Admitting: Nurse Practitioner

## 2021-05-26 ENCOUNTER — Ambulatory Visit (INDEPENDENT_AMBULATORY_CARE_PROVIDER_SITE_OTHER): Payer: Medicare PPO | Admitting: Nurse Practitioner

## 2021-05-26 VITALS — BP 167/93 | HR 71 | Temp 98.2°F | Ht 62.6 in | Wt 131.0 lb

## 2021-05-26 DIAGNOSIS — F01B0 Vascular dementia, moderate, without behavioral disturbance, psychotic disturbance, mood disturbance, and anxiety: Secondary | ICD-10-CM | POA: Diagnosis not present

## 2021-05-26 DIAGNOSIS — Z7689 Persons encountering health services in other specified circumstances: Secondary | ICD-10-CM

## 2021-05-26 DIAGNOSIS — I10 Essential (primary) hypertension: Secondary | ICD-10-CM | POA: Diagnosis not present

## 2021-05-26 MED ORDER — LOSARTAN POTASSIUM 50 MG PO TABS: 50.0000 mg | ORAL_TABLET | Freq: Every day | ORAL | 1 refills | Status: AC

## 2021-05-26 MED ORDER — MEMANTINE HCL 10 MG PO TABS: 10.0000 mg | ORAL_TABLET | Freq: Every day | ORAL | 1 refills | Status: AC

## 2021-05-26 MED ORDER — METOPROLOL SUCCINATE ER 100 MG PO TB24: 100.0000 mg | ORAL_TABLET | Freq: Every day | ORAL | 1 refills | Status: AC

## 2021-05-26 NOTE — Progress Notes (Signed)
? ?New Patient Office Visit ? ?Subjective   ? ?Patient ID: [name], female    DOB: May 31, 1932  Age: 88 y.o. MRN: [id number] ? ?CC:  ?Chief Complaint  ?Patient presents with  ? New Patient (Initial Visit)  ? ? ?HPI ?[name] presents to establish care ?The patient is cming from another local provider whose practice has closed. The patient's daughter has brought records from that office to be reviewed.  ?-history of hypertension.- bp elevated at the beginning of visit.  ?-does have history of DVT. Was on eliquis. Per patient's daughter, patient has been off blood thinners for quite a long time.  ?The patient states that she is unable to sleep at night. The patient's daughter states that the patient often sleeps during the day and unable to sleep at night ?-patient states that she needs something for her nerves.  ?-patient does have some dementia. Her daughter lives with her to help with ADLs and medication management.  ? ?Outpatient Encounter Medications as of 05/26/2021  ?Medication Sig  ? acetaminophen (TYLENOL) 650 MG CR tablet Take 1 tablet (650 mg total) by mouth every 8 (eight) hours as needed for pain.  ? clonazePAM (KLONOPIN) 1 MG tablet Take 1 tablet by mouth 2 (two) times daily.  ? HYDROcodone-acetaminophen (NORCO/VICODIN) 5-325 MG tablet Take 1 tablet by mouth 3 (three) times daily as needed (pain).  ? OVER THE COUNTER MEDICATION Take 1 tablet by mouth at bedtime. [facility]S over the counter sleeping pill  ? OVER THE COUNTER MEDICATION Take 15 mLs by mouth at bedtime. [facility] liquid cough syrup - night time  ? [DISCONTINUED] apixaban (ELIQUIS) 5 MG TABS tablet Take 1 tablet (5 mg total) by mouth 2 (two) times daily. 10 mg po bid for 7 days followed by 5 mg po bid  ? [DISCONTINUED] losartan (COZAAR) 50 MG tablet Take 50 mg by mouth daily.  ? [DISCONTINUED] memantine (NAMENDA) 10 MG tablet Take 10 mg by mouth daily.  ? [DISCONTINUED] metoprolol succinate (TOPROL-XL) 100 MG 24 hr tablet Take 100 mg by mouth  daily.  ? losartan (COZAAR) 50 MG tablet Take 1 tablet (50 mg total) by mouth daily.  ? memantine (NAMENDA) 10 MG tablet Take 1 tablet (10 mg total) by mouth daily.  ? metoprolol succinate (TOPROL-XL) 100 MG 24 hr tablet Take 1 tablet (100 mg total) by mouth daily.  ? [DISCONTINUED] apixaban (ELIQUIS) 5 MG TABS tablet Take 2 tablets (10 mg total) by mouth 2 (two) times daily for 7 days. 10 mg po bid for 7 days followed by 5 mg po bid  ? ?No facility-administered encounter medications on file as of 05/26/2021.  ? ? ?Past Medical History:  ?Diagnosis Date  ? Anxiety   ? Anxiety and depression   ? Arthritis   ? "hands, back" (05/04/2017)  ? Chronic back pain   ? Colitis 05/03/2017  ? Depression   ? Diverticulosis of colon (without mention of hemorrhage)   ? Hiatal hernia   ? History of blood transfusion 07/1974  ? "related to childbirth"  ? Hyperlipidemia   ? Hypertension   ? Non compliance w medication regimen   ? Other and unspecified noninfectious gastroenteritis and colitis(558.9)   ? Pneumonia   ? "several times; same time q yr in [place]" (05/04/2017)  ? ? ?Past Surgical History:  ?Procedure Laterality Date  ? BACK SURGERY    ? CESAREAN SECTION    ? EXCISIONAL HEMORRHOIDECTOMY    ? HERNIA REPAIR    ?  INCONTINENCE SURGERY    ? JOINT REPLACEMENT    ? LUMBAR SPINE SURGERY  2009  ? MOLE REMOVAL  04/2017  ? "off my back" (05/04/2017)  ? PERIPHERAL VASCULAR THROMBECTOMY Left 01/13/2020  ? Procedure: PERIPHERAL VASCULAR THROMBECTOMY;  Surgeon: Annice Needyew, Jason S, MD;  Location: [facility] INVASIVE CV LAB;  Service: Cardiovascular;  Laterality: Left;  ? SHOULDER ARTHROSCOPY Left 2007  ? SPINAL FIXATION SURGERY  Multiple  ? "on my back and neck"  ? TOTAL KNEE ARTHROPLASTY  2007  ? left  ? VAGINAL HYSTERECTOMY    ? VENTRAL HERNIA REPAIR  2003  ? ? ?Family History  ?Problem Relation Age of Onset  ? Coronary artery disease Father [id number]  ? Diabetes Father   ? Coronary artery disease Mother   ? Diabetes Mother   ? Diabetes Sister   ? Diabetes Sister    ? ? ?Social History  ? ?Socioeconomic History  ? Marital status: Widowed  ?  Spouse name: Not on file  ? Number of children: 4  ? Years of education: Not on file  ? Highest education level: Not on file  ?Occupational History  ? Occupation: Retired  ?  Employer: [employer]  ?Tobacco Use  ? Smoking status: Never  ? Smokeless tobacco: Never  ?Vaping Use  ? Vaping Use: Never used  ?Substance and Sexual Activity  ? Alcohol use: Not Currently  ? Drug use: Never  ? Sexual activity: Not Currently  ?Other Topics Concern  ? Not on file  ?Social History Narrative  ? Not on file  ? ?Social Determinants of Health  ? ?Financial Resource Strain: Not on file  ?Food Insecurity: Not on file  ?Transportation Needs: Not on file  ?Physical Activity: Not on file  ?Stress: Not on file  ?Social Connections: Not on file  ?Intimate Partner Violence: Not on file  ? ? ?Review of Systems  ?Constitutional:  Positive for malaise/fatigue. Negative for chills and fever.  ?HENT:  Negative for congestion, sinus pain and sore throat.   ?Eyes: Negative.   ?Respiratory:  Negative for cough, shortness of breath and wheezing.   ?Cardiovascular:  Negative for chest pain, palpitations and leg swelling.  ?     Blood pressure moderately elevated today   ?Gastrointestinal:  Negative for constipation, diarrhea, nausea and vomiting.  ?Genitourinary: Negative.   ?Musculoskeletal:  Negative for myalgias.  ?Skin: Negative.   ?Neurological:  Positive for weakness. Negative for dizziness and headaches.  ?     Memory concerns and dementia   ?Endo/Heme/Allergies:  Does not bruise/bleed easily.  ?Psychiatric/Behavioral:  Negative for depression. The patient is nervous/anxious.   ? ?  ? ? ?Objective   ? ?Today's Vitals  ? 05/26/21 1047 05/26/21 1119  ?BP: (!) 175/83 (!) 167/93  ?Pulse: 71   ?Temp: 98.2 ?F (36.8 ?C)   ?SpO2: 98%   ?Weight: 131 lb (59.4 kg)   ?Height: 5' 2.6" (1.59 m)   ? ?Body mass index is 23.5 kg/m?.  ? ?Physical Exam ?Vitals and nursing note  reviewed.  ?Constitutional:   ?   Appearance: Normal appearance. She is well-developed.  ?HENT:  ?   Head: Normocephalic and atraumatic.  ?Eyes:  ?   Pupils: Pupils are equal, round, and reactive to light.  ?Neck:  ?   Vascular: No carotid bruit.  ?Cardiovascular:  ?   Rate and Rhythm: Normal rate and regular rhythm.  ?   Pulses: Normal pulses.  ?   Heart sounds: Normal heart sounds.  ?  Pulmonary:  ?   Effort: Pulmonary effort is normal.  ?   Breath sounds: Normal breath sounds.  ?Abdominal:  ?   Palpations: Abdomen is soft.  ?Musculoskeletal:     ?   General: Normal range of motion.  ?   Cervical back: Normal range of motion and neck supple.  ?Lymphadenopathy:  ?   Cervical: No cervical adenopathy.  ?Skin: ?   General: Skin is warm and dry.  ?   Capillary Refill: Capillary refill takes less than 2 seconds.  ?Neurological:  ?   General: No focal deficit present.  ?   Mental Status: She is alert. She is disoriented and confused.  ?Psychiatric:     ?   Attention and Perception: Attention and perception normal.     ?   Mood and Affect: Affect normal. Mood is anxious.     ?   Speech: Speech normal.     ?   Behavior: Behavior normal. Behavior is cooperative.     ?   Thought Content: Thought content normal.     ?   Cognition and Memory: Cognition is impaired. Memory is impaired.     ?   Judgment: Judgment normal.  ? ? ?{L  ? ?Assessment & Plan:  ?1. Essential hypertensiond ?Continue blood pressure medication. Advised the patient's daughter to monitor blood pressure at home and to notify the office if blood pressure continues to run high. Will adjust medications as indicated  ?- losartan (COZAAR) 50 MG tablet; Take 1 tablet (50 mg total) by mouth daily.  Dispense: 90 tablet; Refill: 1 ?- metoprolol succinate (TOPROL-XL) 100 MG 24 hr tablet; Take 1 tablet (100 mg total) by mouth daily.  Dispense: 90 tablet; Refill: 1 ? ?2. Moderate vascular dementia, unspecified whether behavioral, psychotic, or mood disturbance or anxiety  (HCC) ?Continue namenda as prescribed. Consider addition of aricept at next visit  ?- memantine (NAMENDA) 10 MG tablet; Take 1 tablet (10 mg total) by mouth daily.  Dispense: 90 tablet; Refill: 1 ? ?3. Encoun

## 2022-06-01 ENCOUNTER — Telehealth: Payer: Self-pay

## 2022-06-01 NOTE — Telephone Encounter (Signed)
Called patient to schedule Medicare Annual Wellness Visit (AWV). No voicemail available to leave a message.  Last date of AWV: eligible 01/24/09 for AWV-I  Please schedule an appointment at any time On Annual Wellness Visit Schedule.    Tykeria Wawrzyniak, CMA (AAMA)  [facility]- AWV Program [phone]   

## 2022-06-23 IMAGING — US US EXTREM LOW VENOUS*L*
1 series · 13 of 24 positions shown · non-contrast
Comparison: Prior ultrasound from 10/18/2005.

CLINICAL DATA: Initial evaluation for acute swelling.



[Series 1: us venous img lower uni left (dvt) · portal-venous · 13 of 36 slices shown]
[im 1/36]
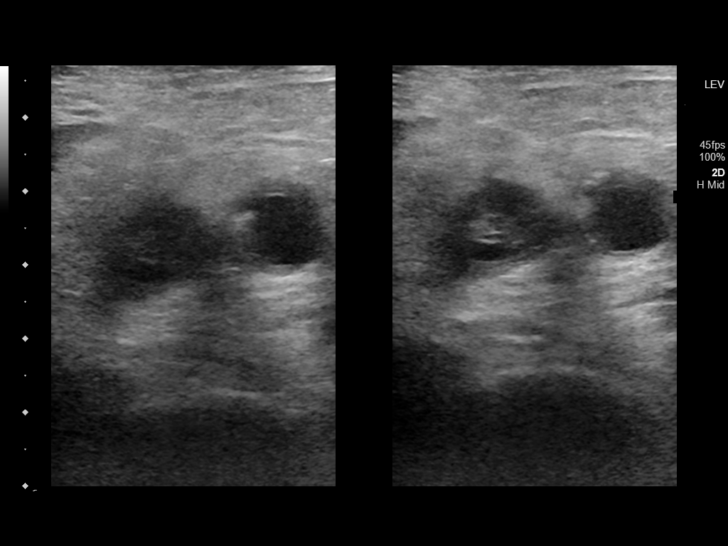
[im 4/36]
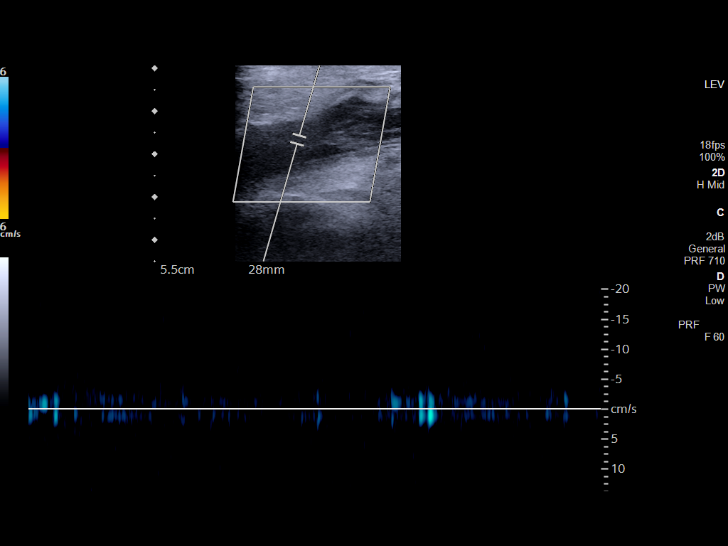
[im 7/36]
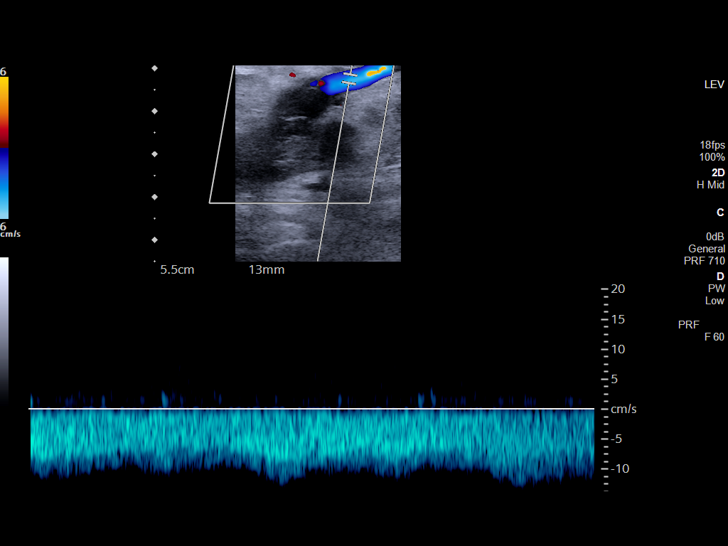
[im 10/36]
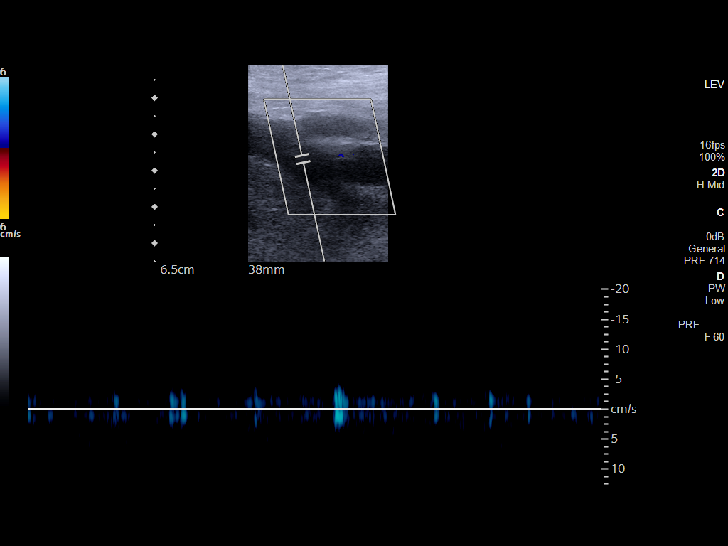
[im 13/36]
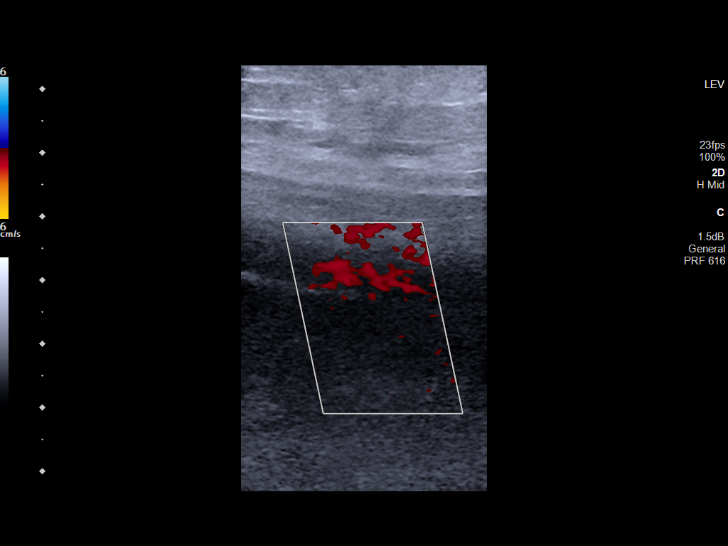
[im 16/36]
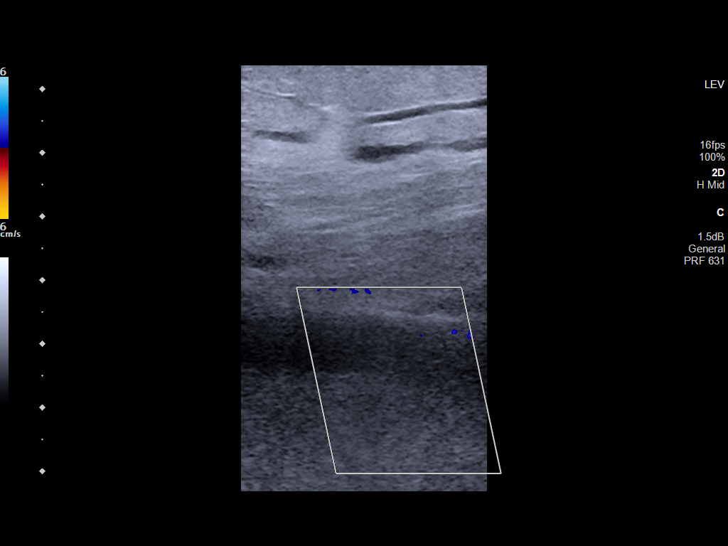
[im 19/36]
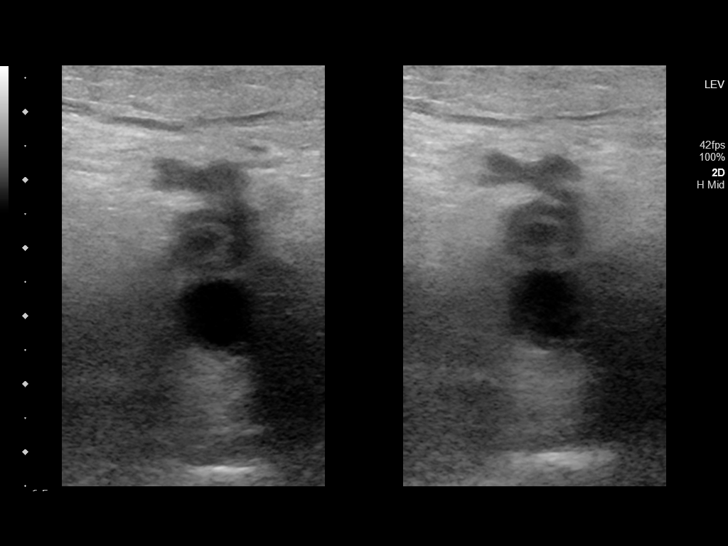
[im 20/36]
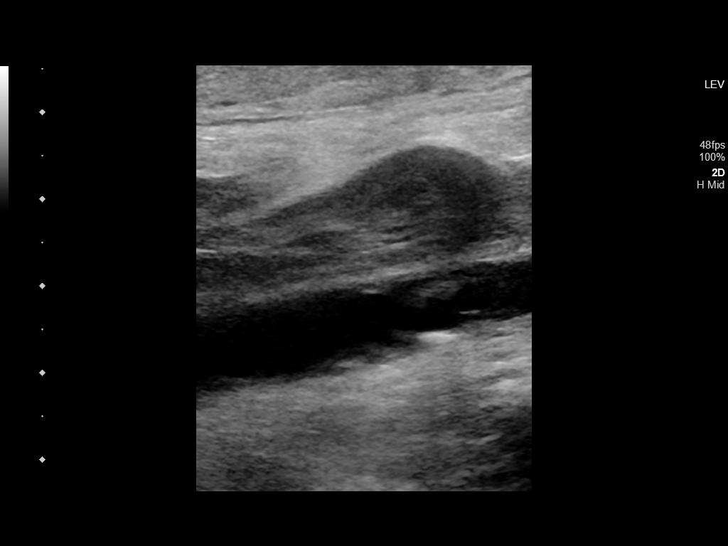
[im 23/36]
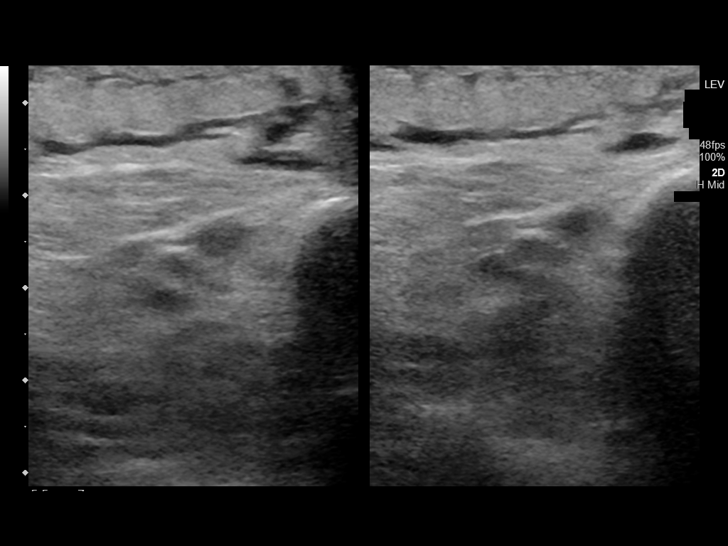
[im 26/36]
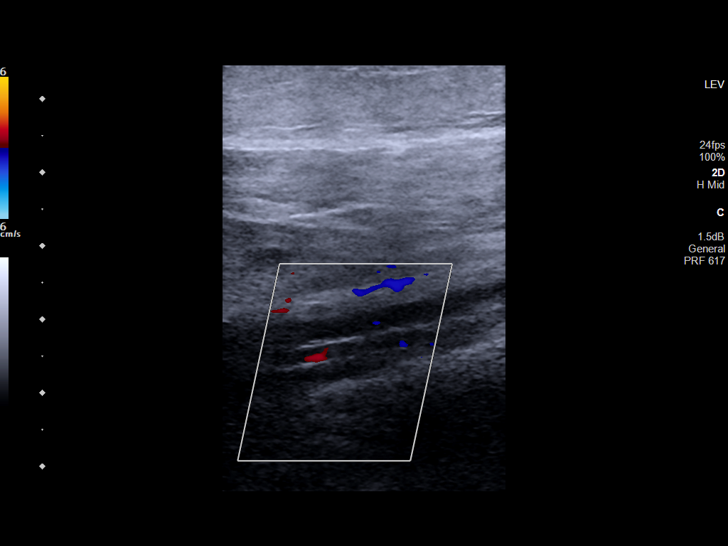
[im 29/36]
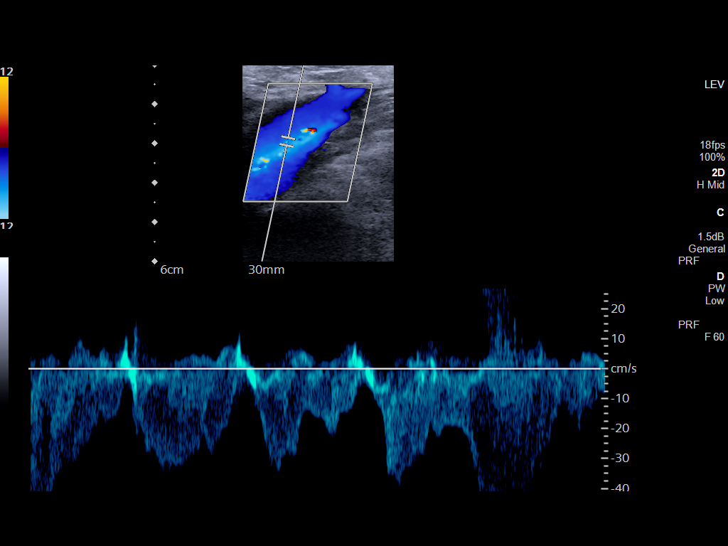
[im 32/36]
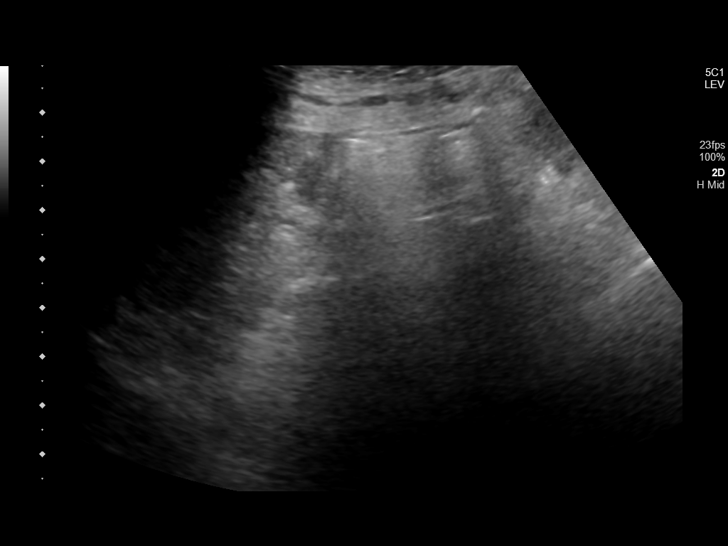
[im 36/36]
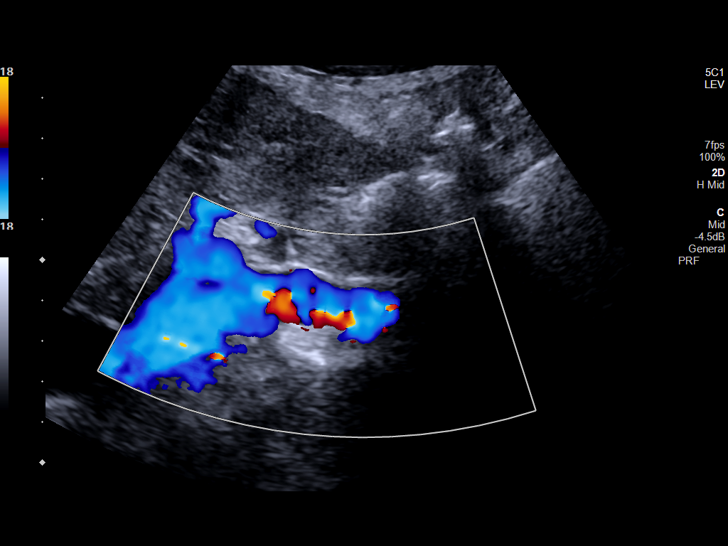

[13 of 24 positions shown; findings below may reference images not displayed]

FINDINGS: Contralateral Common Femoral Vein: Respiratory phasicity is normal
and symmetric with the symptomatic side. No evidence of thrombus.
Normal compressibility.

Common Femoral Vein: Occlusive echogenic thrombus seen throughout
the left common femoral vein.

Saphenofemoral Junction: Occlusive thrombus throughout the left
saphenofemoral junction.

Profunda Femoral Vein: Occlusive echogenic thrombus throughout the
deep left femoral vein.

Femoral Vein: Occlusive echogenic thrombus throughout the left
femoral vein.

Popliteal Vein: Occlusive echogenic thrombus throughout the left
popliteal vein.

Calf Veins: Occlusive echogenic thrombus throughout the visualized
veins of the calf.

Superficial Great Saphenous Vein: Visualized left great saphenous
vein appears patent.

Other Findings: Echogenic occlusive thrombus extends into the
visualized left external iliac vein. The left common iliac vein is
not visualized on this exam. Visualized proximal-mid IVC appears to
be grossly patent.
IMPRESSION: Extensive acute occlusive DVT extending from the left external iliac
vein through the veins of the left calf.

## 2023-04-12 ENCOUNTER — Encounter: Payer: Self-pay | Admitting: Family Medicine

## 2023-04-12 ENCOUNTER — Ambulatory Visit (INDEPENDENT_AMBULATORY_CARE_PROVIDER_SITE_OTHER): Payer: Medicare PPO | Admitting: Family Medicine

## 2023-04-12 VITALS — BP 150/90 | HR 76 | Ht 65.2 in | Wt 160.0 lb

## 2023-04-12 DIAGNOSIS — I10 Essential (primary) hypertension: Secondary | ICD-10-CM

## 2023-04-12 DIAGNOSIS — F0390 Unspecified dementia without behavioral disturbance: Secondary | ICD-10-CM

## 2023-04-12 NOTE — Assessment & Plan Note (Signed)
 Patient appears to be suffering from advanced dementia.  She is nonverbal today.  Son states this has been going on for at least 5 years and progressively worsening.  Son and daughter take care of her and she has 24-hour a day supervision.  Son requesting some additional resources with her care.  Will get some lab work to help rule out other causes of her cognitive decline, will send in home health order for nurse aide.  Provided patient and son information on the Medicare GUIDE program through [facility].  Discussed medications to help with agitation including Rexulti.

## 2023-04-12 NOTE — Progress Notes (Signed)
 Established Patient Office Visit  Subjective   Patient ID: [name], female    DOB: 04-09-1932  Age: 89 y.o. MRN: [id number]  Chief Complaint  Patient presents with   Medication Refill    HPI Patient is here with her son.  Patient is in a wheelchair but does not use a wheelchair at home.  Patient does not respond verbally to questions.  Will sometimes respond appropriately to commands but was nonverbal throughout the appointment.  Per the patient's son, he states he is the healthcare power of attorney, he and his sister have been keeping care of her for the past several years.  Symptoms generally started around 2020 and have progressively worsened.  Per the son, the patient is mobile at home.  Bathes herself, goes to the bathroom on her own, but does not cook clean or do any other ADLs.  She will sometimes be awake for 2 to 3 days in a row before sleeping for extended periods of time.  Will wander.  Believes they have a dog (there is no dog).  Son says she has more "childlike" in her behavior.  Patient is not currently taking any medications due to the difficulty associated with getting her to take them consistently.   The ASCVD Risk score (Arnett DK, et al., 2019) failed to calculate for the following reasons:   The 2019 ASCVD risk score is only valid for ages 89 to 53  Health Maintenance Due  Topic Date Due   Medicare Annual Wellness (AWV)  Never done   Zoster Vaccines- Shingrix (1 of 2) Never done   DEXA SCAN  Never done   Pneumonia Vaccine 40+ Years old (2 of 2 - PPSV23 or PCV20) 03/16/2018   INFLUENZA VACCINE  Never done   COVID-19 Vaccine (1 - 2024-25 season) Never done      Objective:     BP (!) 150/90   Pulse 76   Ht 5' 5.2" (1.656 m)   Wt 160 lb (72.6 kg)   SpO2 96%   BMI 26.46 kg/m    Physical Exam General: Alert, nonverbal.  Sitting in wheelchair. CV: Regular rate rhythm Pulmonary: Lungs clear bilaterally Skin: Varicose veins and telangiectasia in  the feet and lower extremities.:     No results found for any visits on 04/12/23.      Assessment & Plan:   Dementia, unspecified dementia severity, unspecified dementia type, unspecified whether behavioral, psychotic, or mood disturbance or anxiety (HCC) Assessment & Plan: Patient appears to be suffering from advanced dementia.  She is nonverbal today.  Son states this has been going on for at least 5 years and progressively worsening.  Son and daughter take care of her and she has 24-hour a day supervision.  Son requesting some additional resources with her care.  Will get some lab work to help rule out other causes of her cognitive decline, will send in home health order for nurse aide.  Provided patient and son information on the Medicare GUIDE program through [facility].  Discussed medications to help with agitation including Rexulti.  Orders: -     Comprehensive metabolic panel; Future -     CBC with Differential/Platelet; Future -     TSH; Future -     B12 and Folate Panel; Future -     Hemoglobin A1c; Future  Essential hypertension Assessment & Plan: Get updated labs prior to starting any medications.  Blood pressure is elevated but at this point we are in agreement  with the patient family regarding focusing on quality of life versus treating things such as cholesterol and moderately elevated blood pressures.      Return in about 6 weeks (around 05/24/2023) for dementia.    Sandre Kitty, MD

## 2023-04-12 NOTE — Assessment & Plan Note (Signed)
 Get updated labs prior to starting any medications.  Blood pressure is elevated but at this point we are in agreement with the patient family regarding focusing on quality of life versus treating things such as cholesterol and moderately elevated blood pressures.

## 2023-04-12 NOTE — Patient Instructions (Addendum)
 It was nice to see you today,  We addressed the following topics today: -She will need to come back for lab work - I have provided information on the guide program that can help with dementia patients. - I will also send in a home health order for nurse aide - After we get the lab results I can talk to you about medications to help with her agitation.    Have a great day,  Frederic Jericho, MD

## 2023-04-14 ENCOUNTER — Telehealth: Payer: Self-pay | Admitting: *Deleted

## 2023-04-14 NOTE — Telephone Encounter (Signed)
 Copied from CRM [phone]. Topic: General - Other >> Apr 14, 2023 10:22 AM Shon Hale wrote: Reason for CRM: Corrie Dandy w/ [facility] requesting for patient's demographics and insurance information to be faxed over for a home health referral.   Fax: [phone]

## 2023-04-14 NOTE — Telephone Encounter (Signed)
 Requested information faxed

## 2023-04-20 ENCOUNTER — Other Ambulatory Visit: Payer: Self-pay

## 2023-04-20 DIAGNOSIS — F0390 Unspecified dementia without behavioral disturbance: Secondary | ICD-10-CM

## 2023-04-21 ENCOUNTER — Other Ambulatory Visit: Payer: Self-pay

## 2023-05-24 ENCOUNTER — Ambulatory Visit: Payer: Self-pay | Admitting: Family Medicine
